# Patient Record
Sex: Female | Born: 1980 | Race: White | Hispanic: No | Marital: Single | Smoking: Current every day smoker
Health system: Southern US, Community
[De-identification: ages and names within clinical notes are randomized; demographics above are authoritative.]

## PROBLEM LIST (undated history)

## (undated) DIAGNOSIS — M51369 Other intervertebral disc degeneration, lumbar region without mention of lumbar back pain or lower extremity pain: Secondary | ICD-10-CM

## (undated) DIAGNOSIS — F1190 Opioid use, unspecified, uncomplicated: Secondary | ICD-10-CM

## (undated) DIAGNOSIS — K760 Fatty (change of) liver, not elsewhere classified: Secondary | ICD-10-CM

## (undated) DIAGNOSIS — R51 Headache: Secondary | ICD-10-CM

## (undated) DIAGNOSIS — M5136 Other intervertebral disc degeneration, lumbar region: Secondary | ICD-10-CM

## (undated) DIAGNOSIS — I10 Essential (primary) hypertension: Secondary | ICD-10-CM

## (undated) DIAGNOSIS — J449 Chronic obstructive pulmonary disease, unspecified: Secondary | ICD-10-CM

## (undated) DIAGNOSIS — G43909 Migraine, unspecified, not intractable, without status migrainosus: Secondary | ICD-10-CM

## (undated) DIAGNOSIS — K219 Gastro-esophageal reflux disease without esophagitis: Secondary | ICD-10-CM

## (undated) DIAGNOSIS — F17200 Nicotine dependence, unspecified, uncomplicated: Secondary | ICD-10-CM

## (undated) DIAGNOSIS — G4700 Insomnia, unspecified: Secondary | ICD-10-CM

## (undated) DIAGNOSIS — R4020 Unspecified coma: Secondary | ICD-10-CM

## (undated) DIAGNOSIS — B1920 Unspecified viral hepatitis C without hepatic coma: Secondary | ICD-10-CM

## (undated) DIAGNOSIS — F410 Panic disorder [episodic paroxysmal anxiety] without agoraphobia: Secondary | ICD-10-CM

## (undated) DIAGNOSIS — F1020 Alcohol dependence, uncomplicated: Secondary | ICD-10-CM

## (undated) DIAGNOSIS — F419 Anxiety disorder, unspecified: Secondary | ICD-10-CM

## (undated) DIAGNOSIS — R000 Tachycardia, unspecified: Secondary | ICD-10-CM

## (undated) DIAGNOSIS — B182 Chronic viral hepatitis C: Secondary | ICD-10-CM

## (undated) DIAGNOSIS — B977 Papillomavirus as the cause of diseases classified elsewhere: Secondary | ICD-10-CM

## (undated) DIAGNOSIS — R519 Headache, unspecified: Secondary | ICD-10-CM

## (undated) HISTORY — PX: WISDOM TOOTH EXTRACTION: SHX21

## (undated) HISTORY — DX: Anxiety disorder, unspecified: F41.9

## (undated) HISTORY — DX: Panic disorder (episodic paroxysmal anxiety): F41.0

## (undated) HISTORY — DX: Papillomavirus as the cause of diseases classified elsewhere: B97.7

## (undated) HISTORY — DX: Unspecified coma: R40.20

## (undated) HISTORY — DX: Headache: R51

## (undated) HISTORY — DX: Headache, unspecified: R51.9

## (undated) HISTORY — DX: Chronic viral hepatitis C: B18.2

## (undated) HISTORY — DX: Migraine, unspecified, not intractable, without status migrainosus: G43.909

## (undated) HISTORY — PX: SMALL INTESTINE SURGERY: SHX150

## (undated) HISTORY — DX: Unspecified viral hepatitis C without hepatic coma: B19.20

---

## 2009-08-18 ENCOUNTER — Encounter: Payer: Self-pay | Admitting: Cardiology

## 2009-09-22 ENCOUNTER — Encounter: Payer: Self-pay | Admitting: Cardiology

## 2009-10-21 DIAGNOSIS — F17200 Nicotine dependence, unspecified, uncomplicated: Secondary | ICD-10-CM

## 2009-10-21 DIAGNOSIS — R002 Palpitations: Secondary | ICD-10-CM

## 2009-10-21 DIAGNOSIS — F411 Generalized anxiety disorder: Secondary | ICD-10-CM | POA: Insufficient documentation

## 2009-10-21 DIAGNOSIS — G4700 Insomnia, unspecified: Secondary | ICD-10-CM

## 2009-10-24 ENCOUNTER — Ambulatory Visit: Payer: Self-pay | Admitting: Cardiology

## 2009-10-24 DIAGNOSIS — R0602 Shortness of breath: Secondary | ICD-10-CM | POA: Insufficient documentation

## 2010-03-19 DIAGNOSIS — J189 Pneumonia, unspecified organism: Secondary | ICD-10-CM

## 2010-03-19 HISTORY — DX: Pneumonia, unspecified organism: J18.9

## 2010-04-18 NOTE — Letter (Signed)
Summary: External Correspondence/ OFFICE VISIT DR. Aleen Campi  External Correspondence/ OFFICE VISIT DR. Aleen Campi   Imported By: Dorise Hiss 09/26/2009 14:55:49  _____________________________________________________________________  External Attachment:    Type:   Image     Comment:   External Document

## 2010-04-18 NOTE — Letter (Signed)
Summary: External Correspondence/ PROGRESS NOTE DR. TYSINGER  External Correspondence/ PROGRESS NOTE DR. TYSINGER   Imported By: Dorise Hiss 09/26/2009 14:53:17  _____________________________________________________________________  External Attachment:    Type:   Image     Comment:   External Document

## 2010-04-18 NOTE — Assessment & Plan Note (Signed)
Summary: NP-PALPS   Visit Type:  Initial Consult Primary Provider:  Dr. Wyvonnia Lora  CC:  palpitations.  History of Present Illness: The patient presents for evaluation of palpitations. She noticed these several weeks ago. Did feel her heart fluttering. She described skipping heartbeats that occurred randomly. These are not associated with activity. 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 She might get slightly short of breath with this. However, she does not have chest pressure, neck or arm discomfort. She has not had presyncope or syncope. She has not had PND or orthopnea. She was being treated for anxiety at the time of the symptoms. She says that in the last 3 weeks she has had no further palpitations. She has had no prior cardiac workup. She did have palpitations during her pregnancies but never had an evaluation of this. She does drink caffeine. Of note TSH and electrolytes were normal recently.  Preventive Screening-Counseling & Management  Alcohol-Tobacco     Smoking Status: current     Packs/Day: 7 ciggs/day     Year Quit: 14 yrs  Current Medications (verified): 1)  Sertraline Hcl 50 Mg Tabs (Sertraline Hcl) .... Take 1 Tab By Mouth At Bedtime  Allergies (verified): No Known Drug Allergies  Past History:  Past Medical History: Headaches Anxiety Panic attacks HPV  Family History: No early heart disease Oldest sister age 100 depression/anxiety  Social History: Packs/Day:  7 ciggs/day  Review of Systems       As stated in the HPI and negative for all other systems.   Vital Signs:  Patient profile:   30 year old female Height:      58 inches Weight:      111.50 pounds BMI:     23.39 Pulse rate:   99 / minute BP sitting:   124 / 84  (left arm) Cuff size:   regular  Vitals Entered By: Hoover Brunette, LPN (October 24, 2009 1:04 PM) CC: palpitations Is Patient Diabetic? No Comments palpitations   Physical Exam  General:  Well developed, well  nourished, in no acute distress. Head:  normocephalic and atraumatic Eyes:  PERRLA/EOM intact; conjunctiva and lids normal. Mouth:  Edentulous. Oral mucosa normal. Neck:  Neck supple, no JVD. No masses, thyromegaly or abnormal cervical nodes. Chest Wall:  no deformities or breast masses noted Lungs:  Clear bilaterally to auscultation and percussion. Abdomen:  Bowel sounds positive; abdomen soft and non-tender without masses, organomegaly, or hernias noted. No hepatosplenomegaly. Msk:  Back normal, normal gait. Muscle strength and tone normal. Extremities:  No clubbing or cyanosis. Neurologic:  Alert and oriented x 3. Skin:  Intact without lesions or rashes. Cervical Nodes:  no significant adenopathy Axillary Nodes:  no significant adenopathy Inguinal Nodes:  no significant adenopathy Psych:  Normal affect.   Detailed Cardiovascular Exam  Neck    Carotids: Carotids full and equal bilaterally without bruits.      Neck Veins: Normal, no JVD.    Heart    Inspection: no deformities or lifts noted.      Palpation: normal PMI with no thrills palpable.      Auscultation: regular rate and rhythm, S1, S2 without murmurs, rubs, gallops, or clicks.    Vascular    Abdominal Aorta: no palpable masses, pulsations, or audible bruits.      Femoral Pulses: normal femoral pulses bilaterally.      Pedal Pulses: normal pedal pulses bilaterally.      Radial Pulses: normal radial pulses bilaterally.  Peripheral Circulation: no clubbing, cyanosis, or edema noted with normal capillary refill.     EKG  Procedure date:  09/22/2009  Findings:      Normal sinus rhythm, rate 84, axis within normal limits, intervals within normal limits, no acute ST-T wave changes.  Impression & Recommendations:  Problem # 1:  PALPITATIONS (ICD-785.1) The patient is no longer having these palpitations. We discussed avoidance of caffeine. If she gets recurrent symptoms she will let us know and I would apply either  a Holter or event monitor depending on frequency.  Problem # 2:  NONDEPENDENT TOBACCO USE DISORDER (ICD-305.1) We discussed the need to stop smoking. She doesn't seem particularly interested at this point.  Problem # 3:  DYSPNEA (ICD-786.05) I suspect that this might be related to smoking. I will check a BNP level as a screen.  Other Orders: T-BNP  (B Natriuretic Peptide) ([id number])  Patient Instructions: 1)  Your physician recommends that you go to the [facility] for lab work. If the results of your test are normal or stable, you will receive a letter. If they are abnormal, the nurse will contact you by phone.  2)  Follow up as needed

## 2012-11-13 ENCOUNTER — Encounter (HOSPITAL_COMMUNITY): Payer: Self-pay | Admitting: Physician Assistant

## 2012-11-25 ENCOUNTER — Ambulatory Visit (HOSPITAL_COMMUNITY): Payer: Medicaid Other

## 2012-12-09 ENCOUNTER — Ambulatory Visit (HOSPITAL_COMMUNITY)
Admission: RE | Admit: 2012-12-09 | Discharge: 2012-12-09 | Disposition: A | Discharge status: Home / Self Care | Payer: Medicaid Other | Source: Ambulatory Visit | Attending: Physician Assistant | Admitting: Physician Assistant

## 2012-12-09 NOTE — Progress Notes (Signed)
Patient did not come to appointment. Molly Maduro, MD

## 2016-05-27 ENCOUNTER — Encounter (HOSPITAL_COMMUNITY): Payer: Self-pay | Admitting: Emergency Medicine

## 2016-05-27 ENCOUNTER — Emergency Department (HOSPITAL_COMMUNITY)
Admission: EM | Admit: 2016-05-27 | Discharge: 2016-05-28 | Disposition: A | Discharge status: Home / Self Care | Payer: Medicaid Other | Attending: Emergency Medicine | Admitting: Emergency Medicine

## 2016-05-27 DIAGNOSIS — E876 Hypokalemia: Secondary | ICD-10-CM | POA: Insufficient documentation

## 2016-05-27 DIAGNOSIS — R112 Nausea with vomiting, unspecified: Secondary | ICD-10-CM | POA: Diagnosis present

## 2016-05-27 DIAGNOSIS — E860 Dehydration: Secondary | ICD-10-CM

## 2016-05-27 DIAGNOSIS — F17200 Nicotine dependence, unspecified, uncomplicated: Secondary | ICD-10-CM | POA: Diagnosis not present

## 2016-05-27 DIAGNOSIS — R197 Diarrhea, unspecified: Secondary | ICD-10-CM

## 2016-05-27 DIAGNOSIS — I10 Essential (primary) hypertension: Secondary | ICD-10-CM | POA: Insufficient documentation

## 2016-05-27 HISTORY — DX: Essential (primary) hypertension: I10

## 2016-05-27 LAB — CBC
HCT: 37.3 % (ref 36.0–46.0)
Hemoglobin: 13.1 g/dL (ref 12.0–15.0)
MCH: 31.5 pg (ref 26.0–34.0)
MCHC: 35.1 g/dL (ref 30.0–36.0)
MCV: 89.7 fL (ref 78.0–100.0)
Platelets: 225 10*3/uL (ref 150–400)
RBC: 4.16 MIL/uL (ref 3.87–5.11)
RDW: 14.3 % (ref 11.5–15.5)
WBC: 6.1 10*3/uL (ref 4.0–10.5)

## 2016-05-27 LAB — COMPREHENSIVE METABOLIC PANEL
ALK PHOS: 65 U/L (ref 38–126)
ALT: 22 U/L (ref 14–54)
AST: 47 U/L — ABNORMAL HIGH (ref 15–41)
Albumin: 3.4 g/dL — ABNORMAL LOW (ref 3.5–5.0)
Anion gap: 13 (ref 5–15)
BILIRUBIN TOTAL: 1.5 mg/dL — AB (ref 0.3–1.2)
BUN: <5 mg/dL — ABNORMAL LOW (ref 6–20)
CALCIUM: 8.5 mg/dL — AB (ref 8.9–10.3)
CO2: 23 mmol/L (ref 22–32)
CREATININE: 0.65 mg/dL (ref 0.44–1.00)
Chloride: 97 mmol/L — ABNORMAL LOW (ref 101–111)
GFR calc non Af Amer: >60 mL/min (ref >60)
Glucose, Bld: 135 mg/dL — ABNORMAL HIGH (ref 65–99)
Potassium: 3 mmol/L — ABNORMAL LOW (ref 3.5–5.1)
Sodium: 133 mmol/L — ABNORMAL LOW (ref 135–145)
Total Protein: 6.2 g/dL — ABNORMAL LOW (ref 6.5–8.1)

## 2016-05-27 LAB — LIPASE, BLOOD: Lipase: 12 U/L (ref 11–51)

## 2016-05-27 LAB — HCG, QUANTITATIVE, PREGNANCY

## 2016-05-27 MED ORDER — POTASSIUM CHLORIDE CRYS ER 20 MEQ PO TBCR: 20.0000 meq | EXTENDED_RELEASE_TABLET | Freq: Two times a day (BID) | ORAL | 0 refills | Status: DC

## 2016-05-27 MED ORDER — SODIUM CHLORIDE 0.9 % IV BOLUS (SEPSIS)
2000.0000 mL | Freq: Once | INTRAVENOUS | Status: AC
  Administered 2016-05-27: 2000 mL via INTRAVENOUS

## 2016-05-27 MED ORDER — ONDANSETRON 8 MG PO TBDP: 8.0000 mg | ORAL_TABLET | Freq: Three times a day (TID) | ORAL | 0 refills | Status: DC | PRN

## 2016-05-27 MED ORDER — LORAZEPAM 2 MG/ML IJ SOLN
1.0000 mg | Freq: Once | INTRAMUSCULAR | Status: AC
  Administered 2016-05-27: 1 mg via INTRAVENOUS
  Filled 2016-05-27: qty 1

## 2016-05-27 MED ORDER — METOCLOPRAMIDE HCL 5 MG/ML IJ SOLN
10.0000 mg | Freq: Once | INTRAMUSCULAR | Status: AC
  Administered 2016-05-27: 10 mg via INTRAVENOUS
  Filled 2016-05-27: qty 2

## 2016-05-27 MED ORDER — ONDANSETRON HCL 4 MG/2ML IJ SOLN
4.0000 mg | Freq: Once | INTRAMUSCULAR | Status: AC
  Administered 2016-05-27: 4 mg via INTRAVENOUS
  Filled 2016-05-27: qty 2

## 2016-05-27 NOTE — ED Triage Notes (Signed)
Pt states that she has been having vomiting and diarrhea for 4 days, started having high bp today

## 2016-05-27 NOTE — ED Notes (Signed)
Pt with piloerection, N/vomiting MD apprised, orders received.

## 2016-05-27 NOTE — ED Provider Notes (Signed)
[facility] Provider Note   CSN: [id number] Arrival date & time: 05/27/16  1940     History   Chief Complaint Chief Complaint  Patient presents with  . Emesis  . Hypertension    HPI Hannah Rhodes is a 36 y.o. female.  HPI Patient ports nausea vomiting and diarrhea over the past 4 days with inability keep fluids down.  She feels weak.  She presents tachycardic to 140.  No hematemesis.  No melena or hematochezia described.  Denies abdominal pain.  No fever or chills.  No recent sick contacts.  Symptoms are moderate in severity.  No other complaints  Past Medical History:  Diagnosis Date  . Anxiety   . Generalized headaches   . HPV (human papilloma virus) infection   . Hypertension   . Panic attack     Patient Active Problem List   Diagnosis Date Noted  . DYSPNEA 10/24/2009  . ANXIETY STATE, UNSPECIFIED 10/21/2009  . NONDEPENDENT TOBACCO USE DISORDER 10/21/2009  . INSOMNIA UNSPECIFIED 10/21/2009  . PALPITATIONS 10/21/2009    Past Surgical History:  Procedure Laterality Date  . CESAREAN SECTION    . CESAREAN SECTION    . SMALL INTESTINE SURGERY      OB History    No data available       Home Medications    Prior to Admission medications   Medication Sig Start Date End Date Taking? Authorizing Provider  sertraline (ZOLOFT) 50 MG tablet Take 50 mg by mouth daily.    Historical Provider, MD    Family History History reviewed. No pertinent family history.  Social History Social History  Substance Use Topics  . Smoking status: Current Every Day Smoker    Packs/day: 0.50  . Smokeless tobacco: Never Used  . Alcohol use Yes     Comment: occ     Allergies   Patient has no known allergies.   Review of Systems Review of Systems  All other systems reviewed and are negative.    Physical Exam Updated Vital Signs BP 138/99   Pulse 97   Temp 98.8 F (37.1 C) (Oral)   Resp 15   Ht 4\' 10"  (1.473 m)   Wt 100 lb (45.4 kg)   LMP 05/26/2016  (Exact Date)   SpO2 98%   BMI 20.90 kg/m   Physical Exam  Constitutional: She is oriented to person, place, and time. She appears well-developed and well-nourished. No distress.  HENT:  Head: Normocephalic and atraumatic.  Eyes: EOM are normal.  Neck: Normal range of motion.  Cardiovascular: Regular rhythm and normal heart sounds.   Tachycardia  Pulmonary/Chest: Effort normal and breath sounds normal.  Abdominal: Soft. She exhibits no distension. There is no tenderness.  Musculoskeletal: Normal range of motion.  Neurological: She is alert and oriented to person, place, and time.  Skin: Skin is warm and dry.  Psychiatric: She has a normal mood and affect. Judgment normal.  Nursing note and vitals reviewed.    ED Treatments / Results  Labs (all labs ordered are listed, but only abnormal results are displayed) Labs Reviewed  COMPREHENSIVE METABOLIC PANEL - Abnormal; Notable for the following:       Result Value   Sodium 133 (*)    Potassium 3.0 (*)    Chloride 97 (*)    Glucose, Bld 135 (*)    BUN <5 (*)    Calcium 8.5 (*)    Total Protein 6.2 (*)    Albumin 3.4 (*)  AST 47 (*)    Total Bilirubin 1.5 (*)    All other components within normal limits  LIPASE, BLOOD  CBC  HCG, QUANTITATIVE, PREGNANCY  URINALYSIS, ROUTINE W REFLEX MICROSCOPIC    EKG  EKG Interpretation  Date/Time:  Sunday May 27 2016 20:27:05 EDT Ventricular Rate:  110 PR Interval:    QRS Duration: 80 QT Interval:  334 QTC Calculation: 452 R Axis:   7 Text Interpretation:  Sinus tachycardia Right atrial enlargement Baseline wander in lead(s) V5 No old tracing to compare Confirmed by Oluwatosin Bracy  MD, Vanette Noguchi ([id number]) on 05/27/2016 9:46:59 PM       Radiology No results found.  Procedures Procedures (including critical care time)  Medications Ordered in ED Medications  sodium chloride 0.9 % bolus 2,000 mL (2,000 mLs Intravenous New Bag/Given 05/27/16 2105)  ondansetron (ZOFRAN) injection 4 mg (4  mg Intravenous Given 05/27/16 2112)  metoCLOPramide (REGLAN) injection 10 mg (10 mg Intravenous Given 05/27/16 2158)  LORazepam (ATIVAN) injection 1 mg (1 mg Intravenous Given 05/27/16 2157)     Initial Impression / Assessment and Plan / ED Course  I have reviewed the triage vital signs and the nursing notes.  Pertinent labs & imaging results that were available during my care of the patient were reviewed by me and considered in my medical decision making (see chart for details).     10 :09 PM Patient feels better after second dose of anti-medics here.  Her heart rate is 100.  She is receiving IV fluids.  Her repeat abdominal exam is without tenderness.  She will be discharged home after she completes her IV fluids.  She does have mild hypokalemia with a K of 3.  She'll be discharged home with nausea medications and oral potassium.  Primary care follow-up.  She understands to return to the ER for new or worsening symptoms  Final Clinical Impressions(s) / ED Diagnoses   Final diagnoses:  None    New Prescriptions New Prescriptions   No medications on file     Azalia BilisKevin Nicky Kras, MD 05/27/16 2209

## 2016-05-28 NOTE — ED Notes (Signed)
Patient unable to provide specimen for UA.

## 2016-12-05 ENCOUNTER — Emergency Department (HOSPITAL_COMMUNITY)
Admission: EM | Admit: 2016-12-05 | Discharge: 2016-12-05 | Disposition: A | Discharge status: Home / Self Care | Payer: Medicaid Other | Attending: Emergency Medicine | Admitting: Emergency Medicine

## 2016-12-05 ENCOUNTER — Encounter (HOSPITAL_COMMUNITY): Payer: Self-pay | Admitting: *Deleted

## 2016-12-05 DIAGNOSIS — F419 Anxiety disorder, unspecified: Secondary | ICD-10-CM | POA: Insufficient documentation

## 2016-12-05 DIAGNOSIS — F17210 Nicotine dependence, cigarettes, uncomplicated: Secondary | ICD-10-CM | POA: Insufficient documentation

## 2016-12-05 DIAGNOSIS — I10 Essential (primary) hypertension: Secondary | ICD-10-CM | POA: Diagnosis not present

## 2016-12-05 DIAGNOSIS — Z79899 Other long term (current) drug therapy: Secondary | ICD-10-CM | POA: Insufficient documentation

## 2016-12-05 DIAGNOSIS — R000 Tachycardia, unspecified: Secondary | ICD-10-CM | POA: Diagnosis not present

## 2016-12-05 DIAGNOSIS — R002 Palpitations: Secondary | ICD-10-CM | POA: Diagnosis present

## 2016-12-05 LAB — COMPREHENSIVE METABOLIC PANEL
ALBUMIN: 4.3 g/dL (ref 3.5–5.0)
ALK PHOS: 80 U/L (ref 38–126)
ALT: 34 U/L (ref 14–54)
AST: 66 U/L — AB (ref 15–41)
BUN: 8 mg/dL (ref 6–20)
CALCIUM: 9 mg/dL (ref 8.9–10.3)
CO2: 17 mmol/L — AB (ref 22–32)
CREATININE: 0.78 mg/dL (ref 0.44–1.00)
Chloride: 97 mmol/L — ABNORMAL LOW (ref 101–111)
GFR calc non Af Amer: >60 mL/min (ref >60)
GLUCOSE: 96 mg/dL (ref 65–99)
Potassium: 3.3 mmol/L — ABNORMAL LOW (ref 3.5–5.1)
SODIUM: 135 mmol/L (ref 135–145)
Total Bilirubin: 2.1 mg/dL — ABNORMAL HIGH (ref 0.3–1.2)
Total Protein: 7.3 g/dL (ref 6.5–8.1)

## 2016-12-05 LAB — CBC WITH DIFFERENTIAL/PLATELET
BASOS PCT: 0 %
Basophils Absolute: 0 10*3/uL (ref 0.0–0.1)
EOS PCT: 0 %
Eosinophils Absolute: 0 10*3/uL (ref 0.0–0.7)
HEMATOCRIT: 35.6 % — AB (ref 36.0–46.0)
Hemoglobin: 12.2 g/dL (ref 12.0–15.0)
Lymphocytes Relative: 35 %
Lymphs Abs: 2.4 10*3/uL (ref 0.7–4.0)
MCH: 33.4 pg (ref 26.0–34.0)
MCHC: 34.3 g/dL (ref 30.0–36.0)
MCV: 97.5 fL (ref 78.0–100.0)
MONO ABS: 0.8 10*3/uL (ref 0.1–1.0)
Monocytes Relative: 12 %
NEUTROS ABS: 3.6 10*3/uL (ref 1.7–7.7)
Neutrophils Relative %: 53 %
PLATELETS: 307 10*3/uL (ref 150–400)
RBC: 3.65 MIL/uL — ABNORMAL LOW (ref 3.87–5.11)
RDW: 16.4 % — AB (ref 11.5–15.5)
WBC: 6.8 10*3/uL (ref 4.0–10.5)

## 2016-12-05 LAB — TROPONIN I: Troponin I: <0.03 ng/mL (ref <0.03)

## 2016-12-05 LAB — TSH: TSH: 1.505 u[IU]/mL (ref 0.350–4.500)

## 2016-12-05 MED ORDER — LORAZEPAM 0.5 MG PO TABS: 0.5000 mg | ORAL_TABLET | Freq: Three times a day (TID) | ORAL | 0 refills | Status: DC | PRN

## 2016-12-05 MED ORDER — METOPROLOL TARTRATE 25 MG PO TABS: ORAL_TABLET | ORAL | 0 refills | Status: DC

## 2016-12-05 MED ORDER — LORAZEPAM 2 MG/ML IJ SOLN
0.5000 mg | Freq: Once | INTRAMUSCULAR | Status: AC
  Administered 2016-12-05: 0.5 mg via INTRAVENOUS
  Filled 2016-12-05: qty 1

## 2016-12-05 MED ORDER — SODIUM CHLORIDE 0.9 % IV BOLUS (SEPSIS)
1000.0000 mL | Freq: Once | INTRAVENOUS | Status: AC
  Administered 2016-12-05: 1000 mL via INTRAVENOUS

## 2016-12-05 MED ORDER — ONDANSETRON HCL 4 MG/2ML IJ SOLN
INTRAMUSCULAR | Status: AC
  Filled 2016-12-05: qty 2

## 2016-12-05 MED ORDER — METOPROLOL TARTRATE 5 MG/5ML IV SOLN
5.0000 mg | Freq: Once | INTRAVENOUS | Status: AC
  Administered 2016-12-05: 5 mg via INTRAVENOUS
  Filled 2016-12-05: qty 5

## 2016-12-05 MED ORDER — ONDANSETRON HCL 4 MG/2ML IJ SOLN
4.0000 mg | Freq: Once | INTRAMUSCULAR | Status: AC
  Administered 2016-12-05: 4 mg via INTRAVENOUS

## 2016-12-05 NOTE — Discharge Instructions (Signed)
Avoid nicotine.

## 2016-12-05 NOTE — ED Provider Notes (Signed)
[facility] Provider Note   CSN: [id number] Arrival date & time: 12/05/16  1478     History   Chief Complaint Chief Complaint  Patient presents with  . Palpitations    HPI Rheanne Cortopassi is a 36 y.o. female.  Pt presents to the ED today with palpitations and cp.  Pt said she's had problems with tachycardia for months.  She was on a medication to control it, but has been out of it for the past 3 days.  She does not have a pcp to call for a refill.  The pt does not know the name of the medication.  She also has a hx of htn, but did not take her meds this morning.  She does not know the name of that med.  The pt denies sob.  No f/c.  She does feel anxious which she also has a hx of feeling.        Past Medical History:  Diagnosis Date  . Anxiety   . Generalized headaches   . HPV (human papilloma virus) infection   . Hypertension   . Panic attack     Patient Active Problem List   Diagnosis Date Noted  . DYSPNEA 10/24/2009  . ANXIETY STATE, UNSPECIFIED 10/21/2009  . NONDEPENDENT TOBACCO USE DISORDER 10/21/2009  . INSOMNIA UNSPECIFIED 10/21/2009  . PALPITATIONS 10/21/2009    Past Surgical History:  Procedure Laterality Date  . CESAREAN SECTION    . CESAREAN SECTION    . SMALL INTESTINE SURGERY      OB History    No data available       Home Medications    Prior to Admission medications   Medication Sig Start Date End Date Taking? Authorizing Provider  LORazepam (ATIVAN) 0.5 MG tablet Take 1 tablet (0.5 mg total) by mouth every 8 (eight) hours as needed for anxiety. 12/05/16   Jacalyn Lefevre, MD  metoprolol tartrate (LOPRESSOR) 25 MG tablet bid 12/05/16   Jacalyn Lefevre, MD  ondansetron (ZOFRAN ODT) 8 MG disintegrating tablet Take 1 tablet (8 mg total) by mouth every 8 (eight) hours as needed for nausea or vomiting. 05/27/16   Azalia Bilis, MD  potassium chloride SA (K-DUR,KLOR-CON) 20 MEQ tablet Take 1 tablet (20 mEq total) by mouth 2 (two) times daily.  05/27/16   Azalia Bilis, MD  sertraline (ZOLOFT) 50 MG tablet Take 50 mg by mouth daily.    [provider]    Family History No family history on file.  Social History Social History  Substance Use Topics  . Smoking status: Current Every Day Smoker    Packs/day: 0.50  . Smokeless tobacco: Never Used  . Alcohol use Yes     Comment: occ     Allergies   Patient has no known allergies.   Review of Systems Review of Systems  Cardiovascular: Positive for chest pain and palpitations.  Psychiatric/Behavioral: The patient is nervous/anxious.   All other systems reviewed and are negative.    Physical Exam Updated Vital Signs BP (!) 127/94   Pulse 100   Temp 98.8 F (37.1 C) (Oral)   Resp 20   Ht  (1.473 m)   Wt 47.6 kg (105 lb)   LMP 12/05/2016   SpO2 98%   BMI 21.95 kg/m   Physical Exam  Constitutional: She is oriented to person, place, and time. She appears well-developed and well-nourished.  HENT:  Head: Normocephalic and atraumatic.  Right Ear: External ear normal.  Left Ear: External ear  normal.  Nose: Nose normal.  Mouth/Throat: Oropharynx is clear and moist.  Eyes: Pupils are equal, round, and reactive to light. Conjunctivae and EOM are normal.  Neck: Normal range of motion. Neck supple.  Cardiovascular: Regular rhythm, normal heart sounds and intact distal pulses.  Tachycardia present.   Pulmonary/Chest: Effort normal and breath sounds normal.  Abdominal: Soft. Bowel sounds are normal.  Musculoskeletal: Normal range of motion.  Neurological: She is alert and oriented to person, place, and time.  Skin: Skin is warm. Capillary refill takes less than 2 seconds.  Psychiatric: She has a normal mood and affect. Her behavior is normal. Judgment and thought content normal.  Nursing note and vitals reviewed.    ED Treatments / Results  Labs (all labs ordered are listed, but only abnormal results are displayed) Labs Reviewed  COMPREHENSIVE  METABOLIC PANEL - Abnormal; Notable for the following:       Result Value   Potassium 3.3 (*)    Chloride 97 (*)    CO2 17 (*)    AST 66 (*)    Total Bilirubin 2.1 (*)    All other components within normal limits  CBC WITH DIFFERENTIAL/PLATELET - Abnormal; Notable for the following:    RBC 3.65 (*)    HCT 35.6 (*)    RDW 16.4 (*)    All other components within normal limits  TROPONIN I  TSH  URINALYSIS, ROUTINE W REFLEX MICROSCOPIC  PREGNANCY, URINE  RAPID URINE DRUG SCREEN, HOSP PERFORMED    EKG  EKG Interpretation  Date/Time:  Wednesday December 05 2016 06:40:22 EDT Ventricular Rate:  121 PR Interval:    QRS Duration: 84 QT Interval:  324 QTC Calculation: 460 R Axis:   24 Text Interpretation:  Sinus tachycardia LAE, consider biatrial enlargement No significant change since last tracing Confirmed by Jacalyn Lefevre [phone]) on 12/05/2016 6:57:23 AM       Radiology No results found.  Procedures Procedures (including critical care time)  Medications Ordered in ED Medications  sodium chloride 0.9 % bolus 1,000 mL (1,000 mLs Intravenous New Bag/Given 12/05/16 0713)  LORazepam (ATIVAN) injection 0.5 mg (0.5 mg Intravenous Given 12/05/16 0713)  metoprolol tartrate (LOPRESSOR) injection 5 mg (5 mg Intravenous Given 12/05/16 0713)  ondansetron (ZOFRAN) injection 4 mg (4 mg Intravenous Given 12/05/16 0756)     Initial Impression / Assessment and Plan / ED Course  I have reviewed the triage vital signs and the nursing notes.  Pertinent labs & imaging results that were available during my care of the patient were reviewed by me and considered in my medical decision making (see chart for details).     Pt spoke with her mom.  She was on metoprolol 25 mg daily.  She has been on wellbutrin and zoloft for anxiety, but those have not worked.  Pt encouraged to f/u with pcp on medicaid card and f/u with cardiology.  Return if worse.  Final Clinical Impressions(s) / ED Diagnoses    Final diagnoses:  Sinus tachycardia  Anxiety    New Prescriptions New Prescriptions   LORAZEPAM (ATIVAN) 0.5 MG TABLET    Take 1 tablet (0.5 mg total) by mouth every 8 (eight) hours as needed for anxiety.   METOPROLOL TARTRATE (LOPRESSOR) 25 MG TABLET    bid     Jacalyn Lefevre, MD 12/05/16 [phone]

## 2016-12-05 NOTE — ED Triage Notes (Signed)
Pt says she  Has had a rapid heart rate for the past 3 hours. Pt has been on medication to keep under control but has been out of it for the past 3 days. No PCP at this time.

## 2016-12-05 NOTE — ED Notes (Signed)
This nurse entered room. Pt dry heaving.

## 2016-12-05 NOTE — ED Notes (Signed)
Pt sleeping at this time. NAD noted.

## 2016-12-05 NOTE — ED Notes (Signed)
Patient states she is unable to give a urine sample at this time 

## 2016-12-19 ENCOUNTER — Emergency Department (HOSPITAL_COMMUNITY)
Admission: EM | Admit: 2016-12-19 | Discharge: 2016-12-19 | Disposition: A | Discharge status: Home / Self Care | Payer: Medicaid Other | Attending: Emergency Medicine | Admitting: Emergency Medicine

## 2016-12-19 ENCOUNTER — Encounter (HOSPITAL_COMMUNITY): Payer: Self-pay

## 2016-12-19 ENCOUNTER — Emergency Department (HOSPITAL_COMMUNITY): Payer: Medicaid Other

## 2016-12-19 DIAGNOSIS — I10 Essential (primary) hypertension: Secondary | ICD-10-CM | POA: Diagnosis not present

## 2016-12-19 DIAGNOSIS — R000 Tachycardia, unspecified: Secondary | ICD-10-CM

## 2016-12-19 DIAGNOSIS — F1410 Cocaine abuse, uncomplicated: Secondary | ICD-10-CM | POA: Insufficient documentation

## 2016-12-19 DIAGNOSIS — F17210 Nicotine dependence, cigarettes, uncomplicated: Secondary | ICD-10-CM | POA: Insufficient documentation

## 2016-12-19 DIAGNOSIS — Z79899 Other long term (current) drug therapy: Secondary | ICD-10-CM | POA: Diagnosis not present

## 2016-12-19 DIAGNOSIS — R079 Chest pain, unspecified: Secondary | ICD-10-CM | POA: Insufficient documentation

## 2016-12-19 HISTORY — DX: Tachycardia, unspecified: R00.0

## 2016-12-19 LAB — CBC WITH DIFFERENTIAL/PLATELET
BASOS PCT: 1 %
Basophils Absolute: 0 10*3/uL (ref 0.0–0.1)
EOS ABS: 0 10*3/uL (ref 0.0–0.7)
Eosinophils Relative: 0 %
HCT: 38.2 % (ref 36.0–46.0)
HEMOGLOBIN: 12.8 g/dL (ref 12.0–15.0)
Lymphocytes Relative: 32 %
Lymphs Abs: 1.2 10*3/uL (ref 0.7–4.0)
MCH: 32.9 pg (ref 26.0–34.0)
MCHC: 33.5 g/dL (ref 30.0–36.0)
MCV: 98.2 fL (ref 78.0–100.0)
MONOS PCT: 8 %
Monocytes Absolute: 0.3 10*3/uL (ref 0.1–1.0)
NEUTROS PCT: 59 %
Neutro Abs: 2.3 10*3/uL (ref 1.7–7.7)
Platelets: 293 10*3/uL (ref 150–400)
RBC: 3.89 MIL/uL (ref 3.87–5.11)
RDW: 15.7 % — AB (ref 11.5–15.5)
WBC: 3.9 10*3/uL — AB (ref 4.0–10.5)

## 2016-12-19 LAB — COMPREHENSIVE METABOLIC PANEL
ALBUMIN: 3.7 g/dL (ref 3.5–5.0)
ALK PHOS: 86 U/L (ref 38–126)
ALT: 38 U/L (ref 14–54)
ANION GAP: 18 — AB (ref 5–15)
AST: 76 U/L — ABNORMAL HIGH (ref 15–41)
BUN: 7 mg/dL (ref 6–20)
CO2: 18 mmol/L — AB (ref 22–32)
Calcium: 8.8 mg/dL — ABNORMAL LOW (ref 8.9–10.3)
Chloride: 95 mmol/L — ABNORMAL LOW (ref 101–111)
Creatinine, Ser: 0.66 mg/dL (ref 0.44–1.00)
GFR calc Af Amer: >60 mL/min (ref >60)
GFR calc non Af Amer: >60 mL/min (ref >60)
GLUCOSE: 118 mg/dL — AB (ref 65–99)
POTASSIUM: 4.2 mmol/L (ref 3.5–5.1)
SODIUM: 131 mmol/L — AB (ref 135–145)
TOTAL PROTEIN: 6.9 g/dL (ref 6.5–8.1)
Total Bilirubin: 1.1 mg/dL (ref 0.3–1.2)

## 2016-12-19 LAB — TROPONIN I: Troponin I: <0.03 ng/mL (ref <0.03)

## 2016-12-19 LAB — RAPID URINE DRUG SCREEN, HOSP PERFORMED
AMPHETAMINES: NOT DETECTED
BARBITURATES: NOT DETECTED
Benzodiazepines: POSITIVE — AB
COCAINE: POSITIVE — AB
OPIATES: NOT DETECTED
TETRAHYDROCANNABINOL: NOT DETECTED

## 2016-12-19 LAB — TSH: TSH: 0.606 u[IU]/mL (ref 0.350–4.500)

## 2016-12-19 MED ORDER — ONDANSETRON HCL 4 MG/2ML IJ SOLN
4.0000 mg | Freq: Once | INTRAMUSCULAR | Status: AC
  Administered 2016-12-19: 4 mg via INTRAVENOUS
  Filled 2016-12-19: qty 2

## 2016-12-19 MED ORDER — SODIUM CHLORIDE 0.9 % IV BOLUS (SEPSIS)
1000.0000 mL | Freq: Once | INTRAVENOUS | Status: AC
  Administered 2016-12-19: 1000 mL via INTRAVENOUS

## 2016-12-19 NOTE — Discharge Instructions (Signed)
Tests show no life-threatening condition. Drinking and smoking will make your pulse elevated. Follow-up with the cardiologist for your scheduled appointment.

## 2016-12-19 NOTE — ED Triage Notes (Signed)
Pt reports increased heart rate and chest pain since last night.  Says symptoms started while she was watching tv last night.  Pt says she takes metoprolol  but says isn't helping.

## 2016-12-22 NOTE — ED Provider Notes (Signed)
[facility] Provider Note   CSN: [id number] Arrival date & time: 12/19/16  0940     History   Chief Complaint Chief Complaint  Patient presents with  . Tachycardia  . Chest Pain    HPI Hannah Rhodes is a 36 y.o. female.  Patient presents with concern for increased pulse. This is a long-standing problem. She had a brief episode of chest pain described as "something stuck" without nausea, vomiting, diaphoresis, dyspnea. Patient is allegedly taking metoprolol 25 mg twice a day. She does smoke cigarettes and drink alcohol. She has an appointment with cardiology on 12/27/16.      Past Medical History:  Diagnosis Date  . Anxiety   . Generalized headaches   . HPV (human papilloma virus) infection   . Hypertension   . Panic attack   . Tachycardia     Patient Active Problem List   Diagnosis Date Noted  . DYSPNEA 10/24/2009  . ANXIETY STATE, UNSPECIFIED 10/21/2009  . NONDEPENDENT TOBACCO USE DISORDER 10/21/2009  . INSOMNIA UNSPECIFIED 10/21/2009  . PALPITATIONS 10/21/2009    Past Surgical History:  Procedure Laterality Date  . CESAREAN SECTION    . CESAREAN SECTION    . SMALL INTESTINE SURGERY      OB History    No data available       Home Medications    Prior to Admission medications   Medication Sig Start Date End Date Taking? Authorizing Provider  metoprolol tartrate (LOPRESSOR) 25 MG tablet bid Patient taking differently: 25 mg 2 (two) times daily. bid 12/05/16  Yes Jacalyn Lefevre, MD  LORazepam (ATIVAN) 0.5 MG tablet Take 1 tablet (0.5 mg total) by mouth every 8 (eight) hours as needed for anxiety. Patient not taking: Reported on 12/19/2016 12/05/16   Jacalyn Lefevre, MD  ondansetron (ZOFRAN ODT) 8 MG disintegrating tablet Take 1 tablet (8 mg total) by mouth every 8 (eight) hours as needed for nausea or vomiting. Patient not taking: Reported on 12/19/2016 05/27/16   Azalia Bilis, MD  potassium chloride SA (K-DUR,KLOR-CON) 20 MEQ tablet Take 1 tablet  (20 mEq total) by mouth 2 (two) times daily. Patient not taking: Reported on 12/19/2016 05/27/16   Azalia Bilis, MD    Family History No family history on file.  Social History Social History  Substance Use Topics  . Smoking status: Current Every Day Smoker    Packs/day: 0.50  . Smokeless tobacco: Never Used  . Alcohol use Yes     Comment: occ     Allergies   Patient has no known allergies.   Review of Systems Review of Systems  All other systems reviewed and are negative.    Physical Exam Updated Vital Signs BP (!) 154/101   Pulse 82   Temp 97.7 F (36.5 C) (Oral)   Resp 15   Ht  (1.473 m)   Wt 47.6 kg (105 lb)   LMP 12/05/2016   SpO2 97%   BMI 21.95 kg/m   Physical Exam  Constitutional: She is oriented to person, place, and time.  nad  HENT:  Head: Normocephalic and atraumatic.  Eyes: Conjunctivae are normal.  Neck: Neck supple.  Cardiovascular: Regular rhythm.   tachy  Pulmonary/Chest: Effort normal and breath sounds normal.  Abdominal: Soft. Bowel sounds are normal.  Musculoskeletal: Normal range of motion.  Neurological: She is alert and oriented to person, place, and time.  Skin: Skin is warm and dry.  Psychiatric: She has a normal mood and affect. Her behavior is normal.  Nursing note and vitals reviewed.    ED Treatments / Results  Labs (all labs ordered are listed, but only abnormal results are displayed) Labs Reviewed  CBC WITH DIFFERENTIAL/PLATELET - Abnormal; Notable for the following:       Result Value   WBC 3.9 (*)    RDW 15.7 (*)    All other components within normal limits  COMPREHENSIVE METABOLIC PANEL - Abnormal; Notable for the following:    Sodium 131 (*)    Chloride 95 (*)    CO2 18 (*)    Glucose, Bld 118 (*)    Calcium 8.8 (*)    AST 76 (*)    Anion gap 18 (*)    All other components within normal limits  RAPID URINE DRUG SCREEN, HOSP PERFORMED - Abnormal; Notable for the following:    Cocaine POSITIVE (*)      Benzodiazepines POSITIVE (*)    All other components within normal limits  TROPONIN I  TSH    EKG  EKG Interpretation  Date/Time:  Wednesday December 19 2016 09:50:53 EDT Ventricular Rate:  107 PR Interval:    QRS Duration: 86 QT Interval:  350 QTC Calculation: 467 R Axis:   -10 Text Interpretation:  Sinus tachycardia Atrial premature complex Right atrial enlargement Low voltage, extremity and precordial leads Confirmed by Donnetta Hutching ([id number]) on 12/19/2016 12:07:47 PM       Radiology No results found.  Procedures Procedures (including critical care time)  Medications Ordered in ED Medications  sodium chloride 0.9 % bolus 1,000 mL (0 mLs Intravenous Stopped 12/19/16 1430)  ondansetron (ZOFRAN) injection 4 mg (4 mg Intravenous Given 12/19/16 1046)     Initial Impression / Assessment and Plan / ED Course  I have reviewed the triage vital signs and the nursing notes.  Pertinent labs & imaging results that were available during my care of the patient were reviewed by me and considered in my medical decision making (see chart for details).     Patient is hemodynamically stable. She is slightly tachycardic. Drug screen positive for cocaine.  TSH normal. Recommend follow-up with cardiology for which she has an appointment.  Final Clinical Impressions(s) / ED Diagnoses   Final diagnoses:  Tachycardia    New Prescriptions Discharge Medication List as of 12/19/2016  3:35 PM       Donnetta Hutching, MD 12/22/16 1123

## 2016-12-27 ENCOUNTER — Ambulatory Visit (INDEPENDENT_AMBULATORY_CARE_PROVIDER_SITE_OTHER): Payer: Medicaid Other | Admitting: Cardiovascular Disease

## 2016-12-27 ENCOUNTER — Encounter: Payer: Self-pay | Admitting: Cardiovascular Disease

## 2016-12-27 VITALS — BP 116/82 | HR 114 | Ht <= 58 in | Wt 96.8 lb

## 2016-12-27 DIAGNOSIS — R002 Palpitations: Secondary | ICD-10-CM | POA: Diagnosis not present

## 2016-12-27 DIAGNOSIS — R413 Other amnesia: Secondary | ICD-10-CM

## 2016-12-27 DIAGNOSIS — R079 Chest pain, unspecified: Secondary | ICD-10-CM | POA: Diagnosis not present

## 2016-12-27 DIAGNOSIS — R0609 Other forms of dyspnea: Secondary | ICD-10-CM | POA: Diagnosis not present

## 2016-12-27 DIAGNOSIS — R000 Tachycardia, unspecified: Secondary | ICD-10-CM | POA: Diagnosis not present

## 2016-12-27 MED ORDER — METOPROLOL TARTRATE 50 MG PO TABS: 50.0000 mg | ORAL_TABLET | Freq: Two times a day (BID) | ORAL | 3 refills | Status: DC

## 2016-12-27 NOTE — Patient Instructions (Signed)
Your physician recommends that you schedule a follow-up appointment in: 2 months with Dr.Koneswaran    Your physician has requested that you have an exercise tolerance test. For further information please visit [email or web address] Please also follow instruction sheet, as given.  HOLD METOPROLOL THE MORNING OF TEST   INCREASE Metoprol to 50 mg twice a day    You have been referred to Neurology for memory loss

## 2016-12-27 NOTE — Progress Notes (Signed)
CARDIOLOGY CONSULT NOTE  Patient ID: Hannah Rhodes MRN: [id number] DOB/AGE: [birth year] 36 y.o.  Admit date: (Not on file) Primary Physician: Patient, No Pcp Per Referring Physician: Dr. Donnetta Hutching  Reason for Consultation: chest pain and palpitations  HPI: Hannah Rhodes is a 36 y.o. female who is being seen today for the evaluation of Chest pain and palpitations at the request of Dr. Donnetta Hutching.   She has been evaluated in the ED on multiple occasions for this problem. Most recently, it occurred on 12/19/16. ED documentation supports she has been taking metoprolol 25 mg twice daily. She also drinks alcohol and smokes cigarettes.  Drug screen was positive for cocaine and benzodiazepines. CBC showed white blood cells 3.9 and normal hemoglobin of 12.8 area Comments of metabolic panel showed sodium mildly low at 131 and elevated AST of 76. Troponin and TSH were normal.  Chest x-ray showed no active cardiopulmonary disease.  ECG demonstrated sinus tachycardia, 107 bpm. ECG on 12/05/16 showed sinus tachycardia, 121 bpm. A urine drug screen was not performed at that time.  She tells me she does not usually use cocaine and only used on that one occasion.  She has been feeling fatigued for the past 3-4 months. She has been experiencing exertional dyspnea for 3 months when climbing stairs or walking to the bus stop. She said she doesn't always have chest pain. She primarily experiences chest pain when her heart is racing and this usually occurs when she is lying down. She denies any recent infection and denies fevers.  She denies syncope. There is no known history of hepatitis.  Her previous PCP passed away and she is in the process of obtaining a new one.  She is here with her mother who is also my patient.  She was apparently in a coma for about 3-4 weeks approximately 6 years ago while she was pregnant. She developed preeclampsia. She also developed pneumonia during her 27th week of  pregnancy.  Ever since then she has struggled with anxiety and panic attacks. She has poor short-term memory and is unable to recall conversations she had earlier in the day or the day before.     No Known Allergies  Current Outpatient Prescriptions  Medication Sig Dispense Refill  . amLODipine (NORVASC) 5 MG tablet Take 5 mg by mouth daily.    Marland Kitchen buPROPion (WELLBUTRIN XL) 300 MG 24 hr tablet Take 300 mg by mouth daily.    . metoprolol tartrate (LOPRESSOR) 25 MG tablet bid (Patient taking differently: 25 mg 2 (two) times daily. bid) 60 tablet 0   No current facility-administered medications for this visit.     Past Medical History:  Diagnosis Date  . Anxiety   . Generalized headaches   . HPV (human papilloma virus) infection   . Hypertension   . Panic attack   . Tachycardia     Past Surgical History:  Procedure Laterality Date  . CESAREAN SECTION    . CESAREAN SECTION    . SMALL INTESTINE SURGERY      Social History   Social History  . Marital status: Single    Spouse name: N/A  . Number of children: N/A  . Years of education: N/A   Occupational History  . Not on file.   Social History Main Topics  . Smoking status: Current Every Day Smoker    Packs/day: 0.50  . Smokeless tobacco: Never Used  . Alcohol use Yes     Comment: occ  .  Drug use: Yes    Types: Marijuana  . Sexual activity: No   Other Topics Concern  . Not on file   Social History Narrative  . No narrative on file     No family history of premature CAD in 1st degree relatives.  Current Meds  Medication Sig  . amLODipine (NORVASC) 5 MG tablet Take 5 mg by mouth daily.  Marland Kitchen buPROPion (WELLBUTRIN XL) 300 MG 24 hr tablet Take 300 mg by mouth daily.  . metoprolol tartrate (LOPRESSOR) 25 MG tablet bid (Patient taking differently: 25 mg 2 (two) times daily. bid)      Review of systems complete and found to be negative unless listed above in HPI    Physical exam Blood pressure 116/82, pulse  (!) 114, height  (1.473 m), weight 96 lb 12.8 oz (43.9 kg), last menstrual period 12/05/2016, SpO2 94 %. General: NAD Neck: No JVD, no thyromegaly or thyroid nodule.  Lungs: Clear to auscultation bilaterally with normal respiratory effort. CV: Nondisplaced PMI. Mildly tachycardic, regular rhythm, normal S1/S2, no S3/S4, no murmur.  No peripheral edema.  No carotid bruit.    Abdomen: Soft, nontender, no distention.  Skin: Intact without lesions or rashes.  Neurologic: Alert and oriented x 3.  Psych: Normal affect. Extremities: No clubbing or cyanosis.  HEENT: Normal.   ECG: Most recent ECG reviewed.   Labs: Lab Results  Component Value Date/Time   K 4.2 12/19/2016 10:25 AM   BUN 7 12/19/2016 10:25 AM   CREATININE 0.66 12/19/2016 10:25 AM   ALT 38 12/19/2016 10:25 AM   TSH 0.606 12/19/2016 10:25 AM   HGB 12.8 12/19/2016 10:25 AM     Lipids: No results found for: LDLCALC, LDLDIRECT, CHOL, TRIG, HDL      ASSESSMENT AND PLAN:  1. Tachycardia and palpitations: I will increase metoprolol to 50 mg twice daily. 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 She denies cocaine use confirms that was an isolated instance. Her tachycardia may be the result of neurologic damage suffered during a coma 6 years ago.  2. Exertional dyspnea and chest pain: Risk factors for ischemic heart disease include hypertension and tobacco use. I will obtain an exercise treadmill stress test. I will have her hold metoprolol on the morning of study.  3. Short-term memory loss: I will make a neurology referral as this apparently occurred after a coma.  4. Tobacco use disorder: She smokes a half pack of cigarettes daily and has done so for over 20 years.     Disposition: Follow up in 2 months  Signed: Prentice Docker, M.D., F.A.C.C.  12/27/2016, 10:05 AM

## 2016-12-28 ENCOUNTER — Encounter: Payer: Self-pay | Admitting: Psychology

## 2017-01-08 ENCOUNTER — Encounter: Payer: Self-pay | Admitting: Cardiovascular Disease

## 2017-01-10 ENCOUNTER — Ambulatory Visit (HOSPITAL_COMMUNITY)
Admission: RE | Admit: 2017-01-10 | Discharge: 2017-01-10 | Disposition: A | Discharge status: Home / Self Care | Payer: Medicaid Other | Source: Ambulatory Visit | Attending: Cardiovascular Disease | Admitting: Cardiovascular Disease

## 2017-01-10 DIAGNOSIS — R0609 Other forms of dyspnea: Secondary | ICD-10-CM | POA: Insufficient documentation

## 2017-01-10 DIAGNOSIS — R079 Chest pain, unspecified: Secondary | ICD-10-CM | POA: Insufficient documentation

## 2017-01-11 LAB — EXERCISE TOLERANCE TEST
CHL CUP MPHR: 183 {beats}/min
CHL CUP RESTING HR STRESS: 77 {beats}/min
CSEPHR: 90 %
CSEPPHR: 166 {beats}/min
Estimated workload: 10.1 METS
Exercise duration (min): 9 min
Exercise duration (sec): 0 s
RPE: 13

## 2017-01-22 ENCOUNTER — Encounter: Payer: Self-pay | Admitting: Cardiovascular Disease

## 2017-02-26 ENCOUNTER — Ambulatory Visit: Payer: Medicaid Other | Admitting: Cardiovascular Disease

## 2017-03-19 ENCOUNTER — Emergency Department (HOSPITAL_COMMUNITY)
Admission: EM | Admit: 2017-03-19 | Discharge: 2017-03-20 | Disposition: A | Discharge status: Home / Self Care | Payer: Medicaid Other | Attending: Emergency Medicine | Admitting: Emergency Medicine

## 2017-03-19 ENCOUNTER — Other Ambulatory Visit: Payer: Self-pay

## 2017-03-19 ENCOUNTER — Emergency Department (HOSPITAL_COMMUNITY): Payer: Medicaid Other

## 2017-03-19 ENCOUNTER — Encounter (HOSPITAL_COMMUNITY): Payer: Self-pay | Admitting: *Deleted

## 2017-03-19 DIAGNOSIS — R079 Chest pain, unspecified: Secondary | ICD-10-CM | POA: Diagnosis present

## 2017-03-19 DIAGNOSIS — R0602 Shortness of breath: Secondary | ICD-10-CM | POA: Insufficient documentation

## 2017-03-19 DIAGNOSIS — R740 Nonspecific elevation of levels of transaminase and lactic acid dehydrogenase [LDH]: Secondary | ICD-10-CM | POA: Insufficient documentation

## 2017-03-19 DIAGNOSIS — R0789 Other chest pain: Secondary | ICD-10-CM | POA: Diagnosis not present

## 2017-03-19 DIAGNOSIS — Z79899 Other long term (current) drug therapy: Secondary | ICD-10-CM | POA: Insufficient documentation

## 2017-03-19 DIAGNOSIS — R1011 Right upper quadrant pain: Secondary | ICD-10-CM | POA: Diagnosis not present

## 2017-03-19 DIAGNOSIS — F17200 Nicotine dependence, unspecified, uncomplicated: Secondary | ICD-10-CM | POA: Diagnosis not present

## 2017-03-19 DIAGNOSIS — I10 Essential (primary) hypertension: Secondary | ICD-10-CM | POA: Insufficient documentation

## 2017-03-19 DIAGNOSIS — R7401 Elevation of levels of liver transaminase levels: Secondary | ICD-10-CM

## 2017-03-19 LAB — BASIC METABOLIC PANEL
Anion gap: 19 — ABNORMAL HIGH (ref 5–15)
BUN: 7 mg/dL (ref 6–20)
CO2: 20 mmol/L — ABNORMAL LOW (ref 22–32)
CREATININE: 0.65 mg/dL (ref 0.44–1.00)
Calcium: 9 mg/dL (ref 8.9–10.3)
Chloride: 96 mmol/L — ABNORMAL LOW (ref 101–111)
GFR calc Af Amer: >60 mL/min (ref >60)
GLUCOSE: 118 mg/dL — AB (ref 65–99)
POTASSIUM: 3.5 mmol/L (ref 3.5–5.1)
SODIUM: 135 mmol/L (ref 135–145)

## 2017-03-19 LAB — HEPATIC FUNCTION PANEL
ALBUMIN: 3.9 g/dL (ref 3.5–5.0)
ALK PHOS: 170 U/L — AB (ref 38–126)
ALT: 113 U/L — ABNORMAL HIGH (ref 14–54)
AST: 249 U/L — AB (ref 15–41)
BILIRUBIN DIRECT: 0.4 mg/dL (ref 0.1–0.5)
BILIRUBIN TOTAL: 1.2 mg/dL (ref 0.3–1.2)
Indirect Bilirubin: 0.8 mg/dL (ref 0.3–0.9)
Total Protein: 7.6 g/dL (ref 6.5–8.1)

## 2017-03-19 LAB — CBC
HCT: 37.3 % (ref 36.0–46.0)
Hemoglobin: 12.6 g/dL (ref 12.0–15.0)
MCH: 31.3 pg (ref 26.0–34.0)
MCHC: 33.8 g/dL (ref 30.0–36.0)
MCV: 92.8 fL (ref 78.0–100.0)
PLATELETS: 210 10*3/uL (ref 150–400)
RBC: 4.02 MIL/uL (ref 3.87–5.11)
RDW: 17.1 % — AB (ref 11.5–15.5)
WBC: 5.3 10*3/uL (ref 4.0–10.5)

## 2017-03-19 LAB — I-STAT BETA HCG BLOOD, ED (MC, WL, AP ONLY): I-stat hCG, quantitative: <5 m[IU]/mL (ref <5)

## 2017-03-19 LAB — LIPASE, BLOOD: Lipase: 58 U/L — ABNORMAL HIGH (ref 11–51)

## 2017-03-19 LAB — I-STAT TROPONIN, ED: Troponin i, poc: 0 ng/mL (ref 0.00–0.08)

## 2017-03-19 LAB — D-DIMER, QUANTITATIVE (NOT AT ARMC): D DIMER QUANT: 0.54 ug{FEU}/mL — AB (ref 0.00–0.50)

## 2017-03-19 MED ORDER — SODIUM CHLORIDE 0.9 % IV BOLUS (SEPSIS)
1000.0000 mL | Freq: Once | INTRAVENOUS | Status: AC
  Administered 2017-03-20: 1000 mL via INTRAVENOUS

## 2017-03-19 MED ORDER — ONDANSETRON HCL 4 MG/2ML IJ SOLN
4.0000 mg | Freq: Once | INTRAMUSCULAR | Status: AC
  Administered 2017-03-19: 4 mg via INTRAVENOUS
  Filled 2017-03-19: qty 2

## 2017-03-19 MED ORDER — IPRATROPIUM-ALBUTEROL 0.5-2.5 (3) MG/3ML IN SOLN
3.0000 mL | Freq: Once | RESPIRATORY_TRACT | Status: AC
  Administered 2017-03-19: 3 mL via RESPIRATORY_TRACT
  Filled 2017-03-19: qty 3

## 2017-03-19 NOTE — ED Provider Notes (Signed)
[facility] EMERGENCY DEPARTMENT Provider Note   CSN: [id number] Arrival date & time: 03/19/17  2208     History   Chief Complaint Chief Complaint  Patient presents with  . Chest Pain    HPI Hannah Rhodes is a 37 y.o. female who presents with chest pain and shortness of breath.  Past medical history significant for hypertension, chronic tachycardia, anxiety,  tobacco abuse.  Patient states that about 3 hours ago she had an acute onset of substernal chest pain while she was driving.  The pain comes and goes.  It feels like a pressure.  The pain radiates to her left shoulder.  She also has shortness of breath and nausea.  It feels similar to prior episodes of chest pain.  She is established with cardiology and had a recent low risk stress test in October.  She is prescribed metoprolol for her chronic palpitations which generally improve her symptoms however tonight it did not therefore she came to the ED.  She denies fever, chills, URI symptoms, cough, abdominal pain, vomiting, leg swelling.  Of note she has tested positive for cocaine in the past. She denies significant alcohol or drug use.  HPI  Past Medical History:  Diagnosis Date  . Anxiety   . Generalized headaches   . HPV (human papilloma virus) infection   . Hypertension   . Panic attack   . Tachycardia     Patient Active Problem List   Diagnosis Date Noted  . DYSPNEA 10/24/2009  . ANXIETY STATE, UNSPECIFIED 10/21/2009  . NONDEPENDENT TOBACCO USE DISORDER 10/21/2009  . INSOMNIA UNSPECIFIED 10/21/2009  . PALPITATIONS 10/21/2009    Past Surgical History:  Procedure Laterality Date  . CESAREAN SECTION    . CESAREAN SECTION    . SMALL INTESTINE SURGERY      OB History    No data available       Home Medications    Prior to Admission medications   Medication Sig Start Date End Date Taking? Authorizing Provider  amLODipine (NORVASC) 5 MG tablet Take 5 mg by mouth daily.    [provider]  buPROPion  (WELLBUTRIN XL) 300 MG 24 hr tablet Take 300 mg by mouth daily.    [provider]  metoprolol tartrate (LOPRESSOR) 50 MG tablet Take 1 tablet (50 mg total) by mouth 2 (two) times daily. 12/27/16 03/27/17  Laqueta Linden, MD    Family History Family History  Problem Relation Age of Onset  . Anxiety disorder Sister   . Restless legs syndrome Sister   . Heart failure Mother   . CAD Mother   . Heart attack Mother     Social History Social History   Tobacco Use  . Smoking status: Current Every Day Smoker    Packs/day: 0.50  . Smokeless tobacco: Never Used  Substance Use Topics  . Alcohol use: Yes    Comment: occ  . Drug use: Yes    Types: Marijuana     Allergies   Patient has no known allergies.   Review of Systems Review of Systems  Constitutional: Negative for chills and fever.  Respiratory: Positive for shortness of breath. Negative for cough.   Cardiovascular: Positive for chest pain and palpitations. Negative for leg swelling.  Gastrointestinal: Positive for nausea. Negative for abdominal pain, diarrhea and vomiting.  Genitourinary: Negative for dysuria.  Neurological: Negative for syncope and light-headedness.  Psychiatric/Behavioral: The patient is nervous/anxious.   All other systems reviewed and are negative.  Physical Exam Updated Vital Signs BP (!) 164/105 (BP Location: Left Arm)   Pulse 92   Temp 99.1 F (37.3 C) (Oral)   Resp 20   Ht 4\' 10"  (1.473 m)   Wt 45.4 kg (100 lb)   LMP 02/27/2017   SpO2 99%   BMI 20.90 kg/m   Physical Exam  Constitutional: She is oriented to person, place, and time. She appears well-developed and well-nourished. No distress.  HENT:  Head: Normocephalic and atraumatic.  Eyes: Conjunctivae are normal. Pupils are equal, round, and reactive to light. Right eye exhibits no discharge. Left eye exhibits no discharge. No scleral icterus.  Neck: Normal range of motion.  Cardiovascular: Normal rate and regular  rhythm. Exam reveals no gallop and no friction rub.  No murmur heard. Pulmonary/Chest: Effort normal. No stridor. No respiratory distress. She has wheezes. She has no rales. She exhibits tenderness (sternal tenderness).  Abdominal: Soft. Bowel sounds are normal. She exhibits no distension and no mass. There is tenderness (epigastric and periumbilical). There is no rebound and no guarding. No hernia.  Neurological: She is alert and oriented to person, place, and time.  Skin: Skin is warm and dry.  Psychiatric: Her behavior is normal. Her mood appears anxious.  Nursing note and vitals reviewed.    ED Treatments / Results  Labs (all labs ordered are listed, but only abnormal results are displayed) Labs Reviewed  BASIC METABOLIC PANEL - Abnormal; Notable for the following components:      Result Value   Chloride 96 (*)    CO2 20 (*)    Glucose, Bld 118 (*)    Anion gap 19 (*)    All other components within normal limits  CBC - Abnormal; Notable for the following components:   RDW 17.1 (*)    All other components within normal limits  HEPATIC FUNCTION PANEL - Abnormal; Notable for the following components:   AST 249 (*)    ALT 113 (*)    Alkaline Phosphatase 170 (*)    All other components within normal limits  LIPASE, BLOOD - Abnormal; Notable for the following components:   Lipase 58 (*)    All other components within normal limits  RAPID URINE DRUG SCREEN, HOSP PERFORMED  I-STAT TROPONIN, ED  I-STAT BETA HCG BLOOD, ED ([facility], [facility], [facility] ONLY)    EKG  EKG Interpretation  Date/Time:  Tuesday March 19 2017 22:20:47 EST Ventricular Rate:  90 PR Interval:    QRS Duration: 80 QT Interval:  404 QTC Calculation: 495 R Axis:   45 Text Interpretation:  Sinus rhythm Borderline prolonged QT interval Confirmed by Bethann BerkshireZammit, Joseph [phone]([id number]) on 03/19/2017 10:23:23 PM       Radiology Dg Chest 2 View  Result Date: 03/19/2017 CLINICAL DATA:  Chest pain radiating to the left shoulder since 8  p.m. Cough, shortness of breath, congestion. History of hypertension and tachycardia. EXAM: CHEST  2 VIEW COMPARISON:  12/19/2016 FINDINGS: The heart size and mediastinal contours are within normal limits. Both lungs are clear. The visualized skeletal structures are unremarkable. IMPRESSION: No active cardiopulmonary disease. Electronically Signed   By: Burman NievesWilliam  Stevens M.D.   On: 03/19/2017 22:50    Procedures Procedures (including critical care time)   Medications Ordered in ED Medications  ipratropium-albuterol (DUONEB) 0.5-2.5 (3) MG/3ML nebulizer solution 3 mL (3 mLs Nebulization Given 03/19/17 2311)  ondansetron (ZOFRAN) injection 4 mg (4 mg Intravenous Given 03/19/17 2304)    Initial Impression / Assessment and Plan / ED Course  I have reviewed the triage vital signs and the nursing notes.  Pertinent labs & imaging results that were available during my care of the patient were reviewed by me and considered in my medical decision making (see chart for details).  37 year old female presents with atypical chest pain and SOB. She has chronic chest pain and it is similar to prior episodes. The main difference is it is lasting longer. She is hypertensive but otherwise vitals are normal. Chest pain work up was initiated and added LFT, lipase, UDS due to her abdominal pain on exam as well as her hx of cocaine use. EKG is NSR.   11:48 PM CBC is normal. CMP is remarkable for low CO2 (20), elevated anion gap (19), and abnormal LFTs. On exam she has periumbilical and epigastric tenderness. Shared visit with Dr. Manus Gunning. D-dimer was added and she had some RUQ tenderness as well. Will order CT pending d-dimer results. 1L bolus IVF given.  12:20 AM D-dimer is minimally elevated. CTA of chest and abdomen/pelvis ordered. She will need delta trop as well which is due at ~1:20AM. Care was signed out to Dr. Manus Gunning who will follow up on results.   Final Clinical Impressions(s) / ED Diagnoses   Final  diagnoses:  Atypical chest pain  Elevated transaminase level    ED Discharge Orders    None       Bethel Born, PA-C 03/20/17 1610    Glynn Octave, MD 03/20/17 1818

## 2017-03-19 NOTE — ED Triage Notes (Signed)
Pt c/o chest pain with radiation down left arm and up shoulder that started x 3 hours ago while driving home; pt describes the pain as an ache and c/o some sob

## 2017-03-20 ENCOUNTER — Emergency Department (HOSPITAL_COMMUNITY): Payer: Medicaid Other

## 2017-03-20 LAB — RAPID URINE DRUG SCREEN, HOSP PERFORMED
Amphetamines: NOT DETECTED
BARBITURATES: NOT DETECTED
BENZODIAZEPINES: POSITIVE — AB
COCAINE: NOT DETECTED
Opiates: NOT DETECTED
Tetrahydrocannabinol: NOT DETECTED

## 2017-03-20 LAB — TROPONIN I: Troponin I: <0.03 ng/mL (ref <0.03)

## 2017-03-20 MED ORDER — LORAZEPAM 1 MG PO TABS
1.0000 mg | ORAL_TABLET | Freq: Once | ORAL | Status: AC
  Administered 2017-03-20: 1 mg via ORAL
  Filled 2017-03-20: qty 1

## 2017-03-20 MED ORDER — IOPAMIDOL (ISOVUE-370) INJECTION 76%
100.0000 mL | Freq: Once | INTRAVENOUS | Status: AC | PRN
  Administered 2017-03-20: 100 mL via INTRAVENOUS

## 2017-03-20 MED ORDER — OMEPRAZOLE 20 MG PO CPDR: 20.0000 mg | DELAYED_RELEASE_CAPSULE | Freq: Every day | ORAL | 0 refills | Status: DC

## 2017-03-20 NOTE — Discharge Instructions (Addendum)
There is no evidence of heart attack or blood clot in the lung. Return tomorrow for an ultrasound of your gallbladder. Return to the ED if you develop new or worsening symptoms.

## 2017-03-22 ENCOUNTER — Ambulatory Visit (HOSPITAL_COMMUNITY)
Admission: RE | Admit: 2017-03-22 | Discharge: 2017-03-22 | Disposition: A | Discharge status: Home / Self Care | Payer: Medicaid Other | Source: Ambulatory Visit | Attending: Emergency Medicine | Admitting: Emergency Medicine

## 2017-03-22 DIAGNOSIS — R1011 Right upper quadrant pain: Secondary | ICD-10-CM | POA: Diagnosis present

## 2017-03-22 DIAGNOSIS — K760 Fatty (change of) liver, not elsewhere classified: Secondary | ICD-10-CM | POA: Insufficient documentation

## 2017-04-10 ENCOUNTER — Ambulatory Visit: Payer: Medicaid Other | Admitting: Cardiovascular Disease

## 2017-05-09 ENCOUNTER — Emergency Department (HOSPITAL_COMMUNITY)
Admission: EM | Admit: 2017-05-09 | Discharge: 2017-05-09 | Disposition: A | Discharge status: Home / Self Care | Payer: Medicaid Other | Attending: Emergency Medicine | Admitting: Emergency Medicine

## 2017-05-09 ENCOUNTER — Emergency Department (HOSPITAL_COMMUNITY): Payer: Medicaid Other

## 2017-05-09 ENCOUNTER — Encounter (HOSPITAL_COMMUNITY): Payer: Self-pay | Admitting: Emergency Medicine

## 2017-05-09 DIAGNOSIS — R112 Nausea with vomiting, unspecified: Secondary | ICD-10-CM

## 2017-05-09 DIAGNOSIS — F17200 Nicotine dependence, unspecified, uncomplicated: Secondary | ICD-10-CM | POA: Diagnosis not present

## 2017-05-09 DIAGNOSIS — K529 Noninfective gastroenteritis and colitis, unspecified: Secondary | ICD-10-CM | POA: Diagnosis not present

## 2017-05-09 DIAGNOSIS — I10 Essential (primary) hypertension: Secondary | ICD-10-CM | POA: Insufficient documentation

## 2017-05-09 DIAGNOSIS — Z79899 Other long term (current) drug therapy: Secondary | ICD-10-CM | POA: Diagnosis not present

## 2017-05-09 DIAGNOSIS — R079 Chest pain, unspecified: Secondary | ICD-10-CM

## 2017-05-09 DIAGNOSIS — R1013 Epigastric pain: Secondary | ICD-10-CM

## 2017-05-09 LAB — BASIC METABOLIC PANEL WITH GFR
Anion gap: 14 (ref 5–15)
BUN: 6 mg/dL (ref 6–20)
CO2: 24 mmol/L (ref 22–32)
Calcium: 8.9 mg/dL (ref 8.9–10.3)
Chloride: 96 mmol/L — ABNORMAL LOW (ref 101–111)
Creatinine, Ser: 0.66 mg/dL (ref 0.44–1.00)
GFR calc Af Amer: >60 mL/min
GFR calc non Af Amer: >60 mL/min
Glucose, Bld: 100 mg/dL — ABNORMAL HIGH (ref 65–99)
Potassium: 5 mmol/L (ref 3.5–5.1)
Sodium: 134 mmol/L — ABNORMAL LOW (ref 135–145)

## 2017-05-09 LAB — CBC WITH DIFFERENTIAL/PLATELET
BASOS ABS: 0.1 10*3/uL (ref 0.0–0.1)
BASOS PCT: 1 %
Eosinophils Absolute: 0 10*3/uL (ref 0.0–0.7)
Eosinophils Relative: 0 %
HEMATOCRIT: 39.2 % (ref 36.0–46.0)
HEMOGLOBIN: 13 g/dL (ref 12.0–15.0)
LYMPHS PCT: 43 %
Lymphs Abs: 2.1 10*3/uL (ref 0.7–4.0)
MCH: 34 pg (ref 26.0–34.0)
MCHC: 33.2 g/dL (ref 30.0–36.0)
MCV: 102.6 fL — ABNORMAL HIGH (ref 78.0–100.0)
Monocytes Absolute: 0.4 10*3/uL (ref 0.1–1.0)
Monocytes Relative: 8 %
NEUTROS ABS: 2.4 10*3/uL (ref 1.7–7.7)
NEUTROS PCT: 48 %
Platelets: 230 10*3/uL (ref 150–400)
RBC: 3.82 MIL/uL — AB (ref 3.87–5.11)
RDW: 17 % — ABNORMAL HIGH (ref 11.5–15.5)
WBC: 4.9 10*3/uL (ref 4.0–10.5)

## 2017-05-09 LAB — HEPATIC FUNCTION PANEL
ALBUMIN: 4.2 g/dL (ref 3.5–5.0)
ALK PHOS: 122 U/L (ref 38–126)
ALT: 69 U/L — AB (ref 14–54)
AST: 158 U/L — AB (ref 15–41)
Bilirubin, Direct: 0.2 mg/dL (ref 0.1–0.5)
Indirect Bilirubin: 0.7 mg/dL (ref 0.3–0.9)
TOTAL PROTEIN: 8 g/dL (ref 6.5–8.1)
Total Bilirubin: 0.9 mg/dL (ref 0.3–1.2)

## 2017-05-09 LAB — PREGNANCY, URINE: Preg Test, Ur: NEGATIVE

## 2017-05-09 LAB — LIPASE, BLOOD: Lipase: 26 U/L (ref 11–51)

## 2017-05-09 MED ORDER — ONDANSETRON HCL 4 MG/2ML IJ SOLN
4.0000 mg | Freq: Once | INTRAMUSCULAR | Status: AC
Start: 2017-05-09 — End: 2017-05-09
  Administered 2017-05-09: 4 mg via INTRAVENOUS
  Filled 2017-05-09: qty 2

## 2017-05-09 MED ORDER — PANTOPRAZOLE SODIUM 40 MG IV SOLR
40.0000 mg | Freq: Once | INTRAVENOUS | Status: AC
Start: 2017-05-09 — End: 2017-05-09
  Administered 2017-05-09: 40 mg via INTRAVENOUS
  Filled 2017-05-09: qty 40

## 2017-05-09 MED ORDER — SODIUM CHLORIDE 0.9 % IV BOLUS (SEPSIS)
1000.0000 mL | Freq: Once | INTRAVENOUS | Status: AC
  Administered 2017-05-09: 1000 mL via INTRAVENOUS

## 2017-05-09 MED ORDER — ONDANSETRON 4 MG PO TBDP: 4.0000 mg | ORAL_TABLET | Freq: Three times a day (TID) | ORAL | 0 refills | Status: DC | PRN

## 2017-05-09 MED ORDER — OMEPRAZOLE 20 MG PO CPDR: 20.0000 mg | DELAYED_RELEASE_CAPSULE | Freq: Two times a day (BID) | ORAL | 1 refills | Status: DC

## 2017-05-09 NOTE — Discharge Instructions (Signed)
Alcohol, tobacco, caffeine, and anti-inflammatory medicines like Motrin or aspirin.  Follow-up with Dr. fields if not improving.  Prilosec as prescribed.  Zofran for nausea.

## 2017-05-09 NOTE — ED Provider Notes (Signed)
[facility] EMERGENCY DEPARTMENT Provider Note   CSN: [id number] Arrival date & time: 05/09/17  1818     History   Chief Complaint Chief Complaint  Patient presents with  . Emesis    HPI Hannah Rhodes is a 37 y.o. female.  HPI complaint is vomiting, concern for pregnancy.  37 year old female.  States she has had nausea and bowel days.  Some epigastric burning.  States that her last episode of emesis had some discomfort in her chest.  States that she had 2 pregnancies tests at home.  One that was equivocal and 1 that was negative.  Last menstrual period was January.  Heme-negative nonbilious emesis.  No alcohol use.  Daily smoker.  Denies marijuana use.  Past Medical History:  Diagnosis Date  . Anxiety   . Generalized headaches   . HPV (human papilloma virus) infection   . Hypertension   . Panic attack   . Tachycardia     Patient Active Problem List   Diagnosis Date Noted  . DYSPNEA 10/24/2009  . ANXIETY STATE, UNSPECIFIED 10/21/2009  . NONDEPENDENT TOBACCO USE DISORDER 10/21/2009  . INSOMNIA UNSPECIFIED 10/21/2009  . PALPITATIONS 10/21/2009    Past Surgical History:  Procedure Laterality Date  . CESAREAN SECTION    . CESAREAN SECTION    . SMALL INTESTINE SURGERY      OB History    No data available       Home Medications    Prior to Admission medications   Medication Sig Start Date End Date Taking? Authorizing Provider  amLODipine (NORVASC) 5 MG tablet Take 5 mg by mouth daily.    [provider]  buPROPion (WELLBUTRIN XL) 300 MG 24 hr tablet Take 300 mg by mouth daily.    [provider]  metoprolol tartrate (LOPRESSOR) 50 MG tablet Take 1 tablet (50 mg total) by mouth 2 (two) times daily. 12/27/16 03/27/17  Laqueta Linden, MD  omeprazole (PRILOSEC) 20 MG capsule Take 1 capsule (20 mg total) by mouth 2 (two) times daily. 05/09/17   Rolland Porter, MD  ondansetron (ZOFRAN ODT) 4 MG disintegrating tablet Take 1 tablet (4 mg total) by  mouth every 8 (eight) hours as needed for nausea. 05/09/17   Rolland Porter, MD    Family History Family History  Problem Relation Age of Onset  . Anxiety disorder Sister   . Restless legs syndrome Sister   . Heart failure Mother   . CAD Mother   . Heart attack Mother     Social History Social History   Tobacco Use  . Smoking status: Current Every Day Smoker    Packs/day: 0.50  . Smokeless tobacco: Never Used  Substance Use Topics  . Alcohol use: Yes    Comment: occ  . Drug use: Yes    Types: Marijuana     Allergies   Patient has no known allergies.   Review of Systems Review of Systems  Constitutional: Negative for appetite change, chills, diaphoresis, fatigue and fever.  HENT: Negative for mouth sores, sore throat and trouble swallowing.   Eyes: Negative for visual disturbance.  Respiratory: Negative for cough, chest tightness, shortness of breath and wheezing.   Cardiovascular: Negative for chest pain.  Gastrointestinal: Positive for abdominal pain, nausea and vomiting. Negative for diarrhea.  Endocrine: Negative for polydipsia, polyphagia and polyuria.  Genitourinary: Negative for dysuria, frequency and hematuria.  Musculoskeletal: Negative for gait problem.  Skin: Negative for color change, pallor and rash.  Neurological: Negative for dizziness, syncope,  light-headedness and headaches.  Hematological: Does not bruise/bleed easily.  Psychiatric/Behavioral: Negative for behavioral problems and confusion.     Physical Exam Updated Vital Signs BP (!) 142/96 (BP Location: Left Arm)   Pulse 81   Temp 99.2 F (37.3 C) (Oral)   Resp 16   Ht 4\' 10"  (1.473 m)   Wt 46.7 kg (103 lb)   LMP 04/19/2017   SpO2 99%   BMI 21.53 kg/m   Physical Exam  Constitutional: She is oriented to person, place, and time. She appears well-developed and well-nourished. No distress.  HENT:  Head: Normocephalic.  Eyes: Conjunctivae are normal. Pupils are equal, round, and reactive  to light. No scleral icterus.  Neck: Normal range of motion. Neck supple. No thyromegaly present.  Cardiovascular: Normal rate and regular rhythm. Exam reveals no gallop and no friction rub.  No murmur heard. Pulmonary/Chest: Effort normal and breath sounds normal. No respiratory distress. She has no wheezes. She has no rales.  Abdominal: Soft. Bowel sounds are normal. She exhibits no distension. There is no tenderness. There is no rebound.  Points to epigastrium.  No peritoneal irritation.  Musculoskeletal: Normal range of motion.  Neurological: She is alert and oriented to person, place, and time.  Skin: Skin is warm and dry. No rash noted.  Psychiatric: She has a normal mood and affect. Her behavior is normal.     ED Treatments / Results  Labs (all labs ordered are listed, but only abnormal results are displayed) Labs Reviewed  CBC WITH DIFFERENTIAL/PLATELET - Abnormal; Notable for the following components:      Result Value   RBC 3.82 (*)    MCV 102.6 (*)    RDW 17.0 (*)    All other components within normal limits  BASIC METABOLIC PANEL - Abnormal; Notable for the following components:   Sodium 134 (*)    Chloride 96 (*)    Glucose, Bld 100 (*)    All other components within normal limits  HEPATIC FUNCTION PANEL - Abnormal; Notable for the following components:   AST 158 (*)    ALT 69 (*)    All other components within normal limits  PREGNANCY, URINE  LIPASE, BLOOD    EKG  EKG Interpretation None       Radiology Dg Chest Port 1 View  Result Date: 05/09/2017 CLINICAL DATA:  37 year old female with chest pain. EXAM: PORTABLE CHEST 1 VIEW COMPARISON:  Chest CT dated 03/20/2017 FINDINGS: The heart size and mediastinal contours are within normal limits. Both lungs are clear. The visualized skeletal structures are unremarkable. IMPRESSION: No active disease. Electronically Signed   By: Elgie Collard M.D.   On: 05/09/2017 22:04    Procedures Procedures (including  critical care time)  Medications Ordered in ED Medications  sodium chloride 0.9 % bolus 1,000 mL (1,000 mLs Intravenous New Bag/Given 05/09/17 2136)  ondansetron [facility]) injection 4 mg (4 mg Intravenous Given 05/09/17 2137)  pantoprazole (PROTONIX) injection 40 mg (40 mg Intravenous Given 05/09/17 2136)     Initial Impression / Assessment and Plan / ED Course  I have reviewed the triage vital signs and the nursing notes.  Pertinent labs & imaging results that were available during my care of the patient were reviewed by me and considered in my medical decision making (see chart for details).    Not pregnant.  Reassuring labs.  Given IV fluids, PPI, antiemetics.  Taking p.o.  Plan discharge home.  Pregnancy is negative.  Prescription Prilosec, Zofran, GI follow-up  if not improving.  Final Clinical Impressions(s) / ED Diagnoses   Final diagnoses:  Non-intractable vomiting with nausea, unspecified vomiting type  Epigastric pain  Gastroenteritis    ED Discharge Orders        Ordered    ondansetron (ZOFRAN ODT) 4 MG disintegrating tablet  Every 8 hours PRN     05/09/17 2259    omeprazole (PRILOSEC) 20 MG capsule  2 times daily     05/09/17 2259       Rolland PorterJames, Marcele Kosta, MD 05/09/17 2302

## 2017-05-09 NOTE — ED Triage Notes (Signed)
Patient complaining of vomiting x 8 days. States "It started as morning sickness but now I'm sick all day." States she took home pregnancy test that showed negative. Also complaining of chest pain that started after vomiting 30 minutes ago.

## 2017-05-14 ENCOUNTER — Encounter: Payer: Self-pay | Admitting: Cardiovascular Disease

## 2017-05-14 ENCOUNTER — Ambulatory Visit (INDEPENDENT_AMBULATORY_CARE_PROVIDER_SITE_OTHER): Payer: Medicaid Other | Admitting: Cardiovascular Disease

## 2017-05-14 VITALS — BP 134/94 | HR 84 | Ht <= 58 in | Wt 100.0 lb

## 2017-05-14 DIAGNOSIS — R079 Chest pain, unspecified: Secondary | ICD-10-CM | POA: Diagnosis not present

## 2017-05-14 DIAGNOSIS — R0609 Other forms of dyspnea: Secondary | ICD-10-CM

## 2017-05-14 DIAGNOSIS — Z9289 Personal history of other medical treatment: Secondary | ICD-10-CM | POA: Diagnosis not present

## 2017-05-14 DIAGNOSIS — R000 Tachycardia, unspecified: Secondary | ICD-10-CM | POA: Diagnosis not present

## 2017-05-14 DIAGNOSIS — I10 Essential (primary) hypertension: Secondary | ICD-10-CM

## 2017-05-14 DIAGNOSIS — Z716 Tobacco abuse counseling: Secondary | ICD-10-CM

## 2017-05-14 DIAGNOSIS — R002 Palpitations: Secondary | ICD-10-CM | POA: Diagnosis not present

## 2017-05-14 DIAGNOSIS — R413 Other amnesia: Secondary | ICD-10-CM | POA: Diagnosis not present

## 2017-05-14 MED ORDER — AMLODIPINE BESYLATE 5 MG PO TABS: 5.0000 mg | ORAL_TABLET | Freq: Every day | ORAL | 3 refills | Status: DC

## 2017-05-14 MED ORDER — VARENICLINE TARTRATE 0.5 MG X 11 & 1 MG X 42 PO MISC: ORAL | 0 refills | Status: DC

## 2017-05-14 NOTE — Progress Notes (Signed)
SUBJECTIVE: The patient returns for follow-up after undergoing cardiovascular testing performed for the evaluation of exertional dyspnea and chest pain.  I increased metoprolol to 50 mg bid at her last visit.  She underwent a low risk exercise tolerance test on 01/10/17 and exercised for 9 minutes.  She was again evaluated in the ED for chest pain on 03/19/17. I reviewed documentation, labs, and studies. Troponins were normal.   CT angiogram of the chest showed no pulmonary embolus nor aortic dissection. There were mild emphysematous changes in the lungs with no active disease. Fatty liver noted and scattered aortic calcifications.  She continues to experience exertional dyspnea when climbing stairs or when walking to the bus stop.  She is here with her mother who is also my patient.  She was apparently in a coma for about 3-4 weeks approximately 6 years ago while she was pregnant. She developed preeclampsia. She also developed pneumonia during her 27th week of pregnancy.  Ever since then she has struggled with anxiety and panic attacks. She has poor short-term memory and is unable to recall conversations she had earlier in the day or the day before.  We discussed her CT results. She has tried Wellbutrin and Chantix in the past, but had bad dreams.  Metoprolol alleviates palpitations about 30 minutes after taking it.  Review of Systems: As per "subjective", otherwise negative.  No Known Allergies  Current Outpatient Medications  Medication Sig Dispense Refill  . metoprolol tartrate (LOPRESSOR) 50 MG tablet Take 1 tablet (50 mg total) by mouth 2 (two) times daily. 180 tablet 3  . omeprazole (PRILOSEC) 20 MG capsule Take 1 capsule (20 mg total) by mouth 2 (two) times daily. 60 capsule 1  . ondansetron (ZOFRAN ODT) 4 MG disintegrating tablet Take 1 tablet (4 mg total) by mouth every 8 (eight) hours as needed for nausea. 6 tablet 0  . buPROPion (WELLBUTRIN XL) 300 MG 24 hr tablet  Take 300 mg by mouth daily.     No current facility-administered medications for this visit.     Past Medical History:  Diagnosis Date  . Anxiety   . Generalized headaches   . HPV (human papilloma virus) infection   . Hypertension   . Panic attack   . Tachycardia     Past Surgical History:  Procedure Laterality Date  . CESAREAN SECTION    . CESAREAN SECTION    . SMALL INTESTINE SURGERY      Social History   Socioeconomic History  . Marital status: Single    Spouse name: Not on file  . Number of children: Not on file  . Years of education: Not on file  . Highest education level: Not on file  Social Needs  . Financial resource strain: Not on file  . Food insecurity - worry: Not on file  . Food insecurity - inability: Not on file  . Transportation needs - medical: Not on file  . Transportation needs - non-medical: Not on file  Occupational History  . Not on file  Tobacco Use  . Smoking status: Current Every Day Smoker    Packs/day: 0.50  . Smokeless tobacco: Never Used  Substance and Sexual Activity  . Alcohol use: Yes    Comment: occ  . Drug use: Yes    Types: Marijuana    Comment: 2-3 weeks ago was last time she smoked   . Sexual activity: No  Other Topics Concern  . Not on file  Social History  Narrative  . Not on file     Vitals:   05/14/17 1408  BP: (!) 134/94  Pulse: 84  SpO2: 97%  Weight: 100 lb (45.4 kg)  Height: 4\' 10"  (1.473 m)    Wt Readings from Last 3 Encounters:  05/14/17 100 lb (45.4 kg)  05/09/17 103 lb (46.7 kg)  03/19/17 100 lb (45.4 kg)     PHYSICAL EXAM General: NAD HEENT: Normal. Neck: No JVD, no thyromegaly. Lungs: Clear to auscultation bilaterally with normal respiratory effort. CV: Regular rate and rhythm, normal S1/S2, no S3/S4, no murmur. No pretibial or periankle edema.   Abdomen: Soft, nontender, no distention.  Neurologic: Alert and oriented.  Psych: Normal affect. Skin: Normal. Musculoskeletal: No gross  deformities.    ECG: Most recent ECG reviewed.   Labs: Lab Results  Component Value Date/Time   K 5.0 05/09/2017 06:56 PM   BUN 6 05/09/2017 06:56 PM   CREATININE 0.66 05/09/2017 06:56 PM   ALT 69 (H) 05/09/2017 06:56 PM   TSH 0.606 12/19/2016 10:25 AM   HGB 13.0 05/09/2017 06:56 PM     Lipids: No results found for: LDLCALC, LDLDIRECT, CHOL, TRIG, HDL     ASSESSMENT AND PLAN: 1. Tachycardia and palpitations: Continue metoprolol 50 mg twice daily. 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 She denies cocaine use confirms that was an isolated instance. Her tachycardia may be the result of neurologic damage suffered during a coma 6 years ago.  2. Exertional dyspnea and chest pain: She underwent a low risk exercise tolerance test on 01/10/17 and exercised for 9 minutes. No further cardiac testing is indicated. I suspect her exertional dyspnea is due to emphysema from longterm tobacco use. We discussed tobacco cessation.  3. Short-term memory loss: I previously made a neurology referral as this apparently occurred after a coma.  4. Tobacco use disorder: She smokes a half pack of cigarettes daily and has done so for over 20 years. Cessation counseling provided (3 minutes). She has shortness of breath due to this alleviated when using her child's inhaler. I will start Chantix.  5. Hypertension: Diastolic BP is elevated. BP 142/96 in ED on 05/09/17. It was 164/105 on 03/19/17 in the ED. She requires additional medical therapy. I will start amlodipine 5 mg daily. She used this in the past and has some at home. I will provide a new prescription with refills.   Disposition: Follow up 6 months.   Prentice DockerSuresh Koneswaran, M.D., F.A.C.C.

## 2017-05-14 NOTE — Patient Instructions (Signed)
Medication Instructions:  RESTART NORVASC 5 MG DAILY    START THE CHANTIX STARTER KIT   Labwork: NONE  Testing/Procedures: NONE  Follow-Up: Your physician wants you to follow-up in: 6 MONTHS .  You will receive a reminder letter in the mail two months in advance. If you don't receive a letter, please call our office to schedule the follow-up appointment.   Any Other Special Instructions Will Be Listed Below (If Applicable).     If you need a refill on your cardiac medications before your next appointment, please call your pharmacy.

## 2017-06-04 ENCOUNTER — Encounter: Payer: Medicaid Other | Admitting: Psychology

## 2017-06-04 NOTE — Progress Notes (Deleted)
NEUROBEHAVIORAL STATUS EXAM   Name: Hannah Rhodes Date of Birth: Dec 03, 1980 Date of Interview: 06/04/2017  Reason for Referral:  Hannah Rhodes is a 37 y.o. female who is referred for neuropsychological evaluation by Dr. Prentice DockerSuresh Koneswaran of [facility] in [place] due to concerns about memory loss since being in a coma 6 years ago. This patient is accompanied in the office by her *** who supplements the history.  History of Presenting Problem:   Dr. Purvis SheffieldKoneswaran 05/14/2017 Tachycardia and palpitations - Her tachycardia may be the result of neurologic damage suffered duringa coma6 years ago. Has been worked up for exertional dyspnea and chest pain, suspects is due to emphysema from longterm tobacco use. She was apparently in a coma for about 3-4 weeks approximately 6 years ago while she was pregnant. She developed preeclampsia. She also developed pneumonia during her 27th week of pregnancy. Ever since then she has struggled with anxiety and panic attacks. She has poor short-term memory and is unable to recall conversationsshe had earlier in the day or the day before.   Onset/Course  Upon direct questioning, the patient/caregiver reported:   Forgetting recent conversations/events:  Repeating statements/questions: Misplacing/losing items: Forgetting appointments or other obligations: Forgetting to take medications:  Difficulty concentrating: Starting but not finishing tasks: Distracted easily: Processing information more slowly:  Word-finding difficulty: Word substitutions: Writing difficulty: Spelling difficulty: Comprehension difficulty:  Getting lost when driving: Making wrong turns when driving: Uncertain about directions when driving or passenger:    Family neuro hx: Any family hx dementia?   Current Functioning: Work:  Complex ADLs Driving: Medication management: Management of finances: Appointments: Cooking:     Medical/Physical complaints:  Any  hx of stroke/TIA, MI, LOC/TBI, Sz? Hx falls?  Balance, probs walking? Sleep: Insomnia? OSA? CPAP? REM sleep beh sx? Visual illusions/hallucinations? Appetite/Nutrition/Weight changes       Current mood:  Depressed mood Anxiety Stress  Behavioral disturbance/Personality change  Suicidal Ideation/Intention:   Psychiatric History: History of depression, anxiety, other MH disorder: History of MH treatment: History of SI: History of substance dependence/treatment:   Social History: Born/Raised:  Education: Occupational history: Marital history: Children: Alcohol:  Tobacco: SA:  Medical History: Past Medical History:  Diagnosis Date  . Anxiety   . Generalized headaches   . HPV (human papilloma virus) infection   . Hypertension   . Panic attack   . Tachycardia       Current Medications:  Outpatient Encounter Medications as of 06/04/2017  Medication Sig  . amLODipine (NORVASC) 5 MG tablet Take 1 tablet (5 mg total) by mouth daily.  Marland Kitchen. buPROPion (WELLBUTRIN XL) 300 MG 24 hr tablet Take 300 mg by mouth daily.  . metoprolol tartrate (LOPRESSOR) 50 MG tablet Take 1 tablet (50 mg total) by mouth 2 (two) times daily.  Marland Kitchen. omeprazole (PRILOSEC) 20 MG capsule Take 1 capsule (20 mg total) by mouth 2 (two) times daily.  . ondansetron (ZOFRAN ODT) 4 MG disintegrating tablet Take 1 tablet (4 mg total) by mouth every 8 (eight) hours as needed for nausea.  . varenicline (CHANTIX STARTING MONTH PAK) 0.5 MG X 11 & 1 MG X 42 tablet Take one 0.5 mg tablet by mouth once daily for 3 days, then increase to one 0.5 mg tablet twice daily for 4 days, then increase to one 1 mg tablet twice daily.   No facility-administered encounter medications on file as of 06/04/2017.      Behavioral Observations:   Appearance: Neatly, casually and appropriately dressed and groomed***  Gait: Ambulated independently, no gross abnormalities observed*** Speech: Fluent; normal rate, rhythm and volume. ***  word finding difficulty. Thought process: Linear, goal directed*** Affect: Full, anxious*** Interpersonal: Pleasant, appropriate***   *** minutes spent face-to-face with patient completing neurobehavioral status exam. *** minutes spent integrating medical records/clinical data and completing this report. [id number] unit; [id number]***.   TESTING: There is medical necessity to proceed with neuropsychological assessment as the results will be used to aid in differential diagnosis and clinical decision-making and to inform specific treatment recommendations. Per the patient, *** and medical records reviewed, there has been a change in cognitive functioning and a reasonable suspicion of dementia***.  Clinical Decision Making: In considering the patient's current level of functioning, level of presumed impairment, nature of symptoms, emotional and behavioral responses during the interview, level of literacy, and observed level of motivation, a battery of tests was selected and communicated to the psychometrician.   ***Option 1:  Following the clinical interview/neurobehavioral status exam, the patient completed this full battery of neuropsychological testing with my psychometrician under my supervision (see separate note).   PLAN: The patient will return to see me for a follow-up session at which time her test performances and my impressions and treatment recommendations will be reviewed in detail.  Evaluation ongoing; full report to follow.  ***Option 2:  PLAN: The patient will return to complete the above referenced full battery of neuropsychological testing with a psychometrician under my supervision. Education regarding testing procedures was provided to the patient. Subsequently, the patient will see this provider for a follow-up session at which time her test performances and my impressions and treatment recommendations will be reviewed in detail.  Evaluation ongoing; full report to  follow.

## 2017-06-18 ENCOUNTER — Encounter: Payer: Medicaid Other | Admitting: Psychology

## 2017-08-30 ENCOUNTER — Emergency Department (HOSPITAL_COMMUNITY)
Admission: EM | Admit: 2017-08-30 | Discharge: 2017-08-30 | Disposition: A | Discharge status: Home / Self Care | Payer: Medicaid Other | Attending: Emergency Medicine | Admitting: Emergency Medicine

## 2017-08-30 ENCOUNTER — Other Ambulatory Visit: Payer: Self-pay

## 2017-08-30 ENCOUNTER — Encounter (HOSPITAL_COMMUNITY): Payer: Self-pay | Admitting: Emergency Medicine

## 2017-08-30 ENCOUNTER — Emergency Department (HOSPITAL_COMMUNITY): Payer: Medicaid Other

## 2017-08-30 DIAGNOSIS — F17200 Nicotine dependence, unspecified, uncomplicated: Secondary | ICD-10-CM | POA: Insufficient documentation

## 2017-08-30 DIAGNOSIS — R0789 Other chest pain: Secondary | ICD-10-CM | POA: Diagnosis not present

## 2017-08-30 DIAGNOSIS — Z79899 Other long term (current) drug therapy: Secondary | ICD-10-CM | POA: Diagnosis not present

## 2017-08-30 DIAGNOSIS — F419 Anxiety disorder, unspecified: Secondary | ICD-10-CM | POA: Diagnosis not present

## 2017-08-30 DIAGNOSIS — I10 Essential (primary) hypertension: Secondary | ICD-10-CM | POA: Diagnosis not present

## 2017-08-30 DIAGNOSIS — R05 Cough: Secondary | ICD-10-CM | POA: Diagnosis not present

## 2017-08-30 LAB — CBC
HEMATOCRIT: 38.6 % (ref 36.0–46.0)
HEMOGLOBIN: 12.6 g/dL (ref 12.0–15.0)
MCH: 30.6 pg (ref 26.0–34.0)
MCHC: 32.6 g/dL (ref 30.0–36.0)
MCV: 93.7 fL (ref 78.0–100.0)
Platelets: 205 10*3/uL (ref 150–400)
RBC: 4.12 MIL/uL (ref 3.87–5.11)
RDW: 14.9 % (ref 11.5–15.5)
WBC: 4.7 10*3/uL (ref 4.0–10.5)

## 2017-08-30 LAB — BASIC METABOLIC PANEL
Anion gap: 11 (ref 5–15)
BUN: 5 mg/dL — AB (ref 6–20)
CALCIUM: 9 mg/dL (ref 8.9–10.3)
CHLORIDE: 95 mmol/L — AB (ref 101–111)
CO2: 26 mmol/L (ref 22–32)
Creatinine, Ser: 0.74 mg/dL (ref 0.44–1.00)
Glucose, Bld: 118 mg/dL — ABNORMAL HIGH (ref 65–99)
Potassium: 3.7 mmol/L (ref 3.5–5.1)
Sodium: 132 mmol/L — ABNORMAL LOW (ref 135–145)

## 2017-08-30 LAB — TROPONIN I

## 2017-08-30 LAB — D-DIMER, QUANTITATIVE (NOT AT ARMC)

## 2017-08-30 LAB — HCG, QUANTITATIVE, PREGNANCY: hCG, Beta Chain, Quant, S: <1 m[IU]/mL (ref <5)

## 2017-08-30 MED ORDER — ALPRAZOLAM 0.5 MG PO TABS
0.2500 mg | ORAL_TABLET | Freq: Once | ORAL | Status: AC
  Administered 2017-08-30: 0.25 mg via ORAL
  Filled 2017-08-30: qty 1

## 2017-08-30 MED ORDER — ALPRAZOLAM 0.25 MG PO TABS: 0.2500 mg | ORAL_TABLET | Freq: Three times a day (TID) | ORAL | 0 refills | Status: DC | PRN

## 2017-08-30 MED ORDER — IBUPROFEN 600 MG PO TABS: 600.0000 mg | ORAL_TABLET | Freq: Four times a day (QID) | ORAL | 0 refills | Status: DC | PRN

## 2017-08-30 MED ORDER — IBUPROFEN 400 MG PO TABS
600.0000 mg | ORAL_TABLET | Freq: Once | ORAL | Status: AC
  Administered 2017-08-30: 600 mg via ORAL
  Filled 2017-08-30: qty 2

## 2017-08-30 NOTE — ED Triage Notes (Signed)
Patient complaining of cough starting 2 hours prior to arrival. Also complaining of chest pain radiating down left arm starting on the way to the ER.

## 2017-08-30 NOTE — ED Provider Notes (Signed)
[facility] EMERGENCY DEPARTMENT Provider Note   CSN: [id number] Arrival date & time: 08/30/17  1153     History   Chief Complaint Chief Complaint  Patient presents with  . Cough    HPI Hannah Rhodes is a 37 y.o. female.  HPI She with acute onset of central chest pain and nonproductive coughing that started at 1050 this morning.  Pain is worse with sitting forward.  Patient has difficulty characterizing pain.  She had no fever or chills.  Denies any shortness of breath.  Has had increased anxiety which may be contributing to her symptoms.  No recent extended travel or immobilization.  No lower extremity swelling or pain. Past Medical History:  Diagnosis Date  . Anxiety   . Generalized headaches   . HPV (human papilloma virus) infection   . Hypertension   . Panic attack   . Tachycardia     Patient Active Problem List   Diagnosis Date Noted  . DYSPNEA 10/24/2009  . ANXIETY STATE, UNSPECIFIED 10/21/2009  . NONDEPENDENT TOBACCO USE DISORDER 10/21/2009  . INSOMNIA UNSPECIFIED 10/21/2009  . PALPITATIONS 10/21/2009    Past Surgical History:  Procedure Laterality Date  . CESAREAN SECTION    . CESAREAN SECTION    . SMALL INTESTINE SURGERY       OB History   None      Home Medications    Prior to Admission medications   Medication Sig Start Date End Date Taking? Authorizing Provider  amLODipine (NORVASC) 5 MG tablet Take 1 tablet (5 mg total) by mouth daily. 05/14/17 08/31/18 Yes Laqueta Linden, MD  buPROPion (WELLBUTRIN XL) 300 MG 24 hr tablet Take 300 mg by mouth daily.   Yes [provider]  metoprolol tartrate (LOPRESSOR) 50 MG tablet Take 1 tablet (50 mg total) by mouth 2 (two) times daily. 12/27/16 08/31/18 Yes Laqueta Linden, MD  ALPRAZolam Prudy Feeler) 0.25 MG tablet Take 1 tablet (0.25 mg total) by mouth 3 (three) times daily as needed for anxiety. 08/30/17   Loren Racer, MD  ibuprofen (ADVIL,MOTRIN) 600 MG tablet Take 1 tablet (600 mg total)  by mouth every 6 (six) hours as needed. 08/30/17   Loren Racer, MD    Family History Family History  Problem Relation Age of Onset  . Anxiety disorder Sister   . Restless legs syndrome Sister   . Heart failure Mother   . CAD Mother   . Heart attack Mother     Social History Social History   Tobacco Use  . Smoking status: Current Every Day Smoker    Packs/day: 0.50  . Smokeless tobacco: Never Used  Substance Use Topics  . Alcohol use: Yes    Comment: occ  . Drug use: Not Currently    Types: Marijuana    Comment: last use 4 months ago     Allergies   Patient has no known allergies.   Review of Systems Review of Systems  Constitutional: Negative for chills and fever.  HENT: Negative for congestion and trouble swallowing.   Respiratory: Positive for cough. Negative for shortness of breath and wheezing.   Cardiovascular: Positive for chest pain. Negative for palpitations and leg swelling.  Gastrointestinal: Negative for abdominal pain, diarrhea, nausea and vomiting.  Genitourinary: Negative for flank pain, frequency and hematuria.  Musculoskeletal: Negative for back pain, neck pain and neck stiffness.  Skin: Negative for rash.  Neurological: Negative for dizziness, weakness, light-headedness, numbness and headaches.  All other systems reviewed and are negative.  Physical Exam Updated Vital Signs BP 114/85   Pulse 73   Temp (!) 97.2 F (36.2 C) (Oral)   Resp (!) 22   Ht 4\' 10"  (1.473 m)   Wt 42.2 kg (93 lb)   LMP 05/30/2017   SpO2 100%   BMI 19.44 kg/m   Physical Exam  Constitutional: She is oriented to person, place, and time. She appears well-developed and well-nourished. No distress.  HENT:  Head: Normocephalic and atraumatic.  Mouth/Throat: Oropharynx is clear and moist.  Eyes: Pupils are equal, round, and reactive to light. EOM are normal.  Neck: Normal range of motion. Neck supple. No JVD present.  Cardiovascular: Normal rate and regular  rhythm. Exam reveals no gallop and no friction rub.  No murmur heard. Pulmonary/Chest: Effort normal and breath sounds normal. No stridor. No respiratory distress. She has no wheezes. She has no rales. She exhibits no tenderness.  Abdominal: Soft. Bowel sounds are normal. There is no tenderness. There is no rebound and no guarding.  Musculoskeletal: Normal range of motion. She exhibits no edema or tenderness.  No midline thoracic or lumbar tenderness.  No lower extremity swelling, asymmetry tender or tenderness.  Distal pulses are 2+.  Lymphadenopathy:    She has no cervical adenopathy.  Neurological: She is alert and oriented to person, place, and time.  Moving all extremities without focal deficit.  Sensation fully intact.  Skin: Skin is warm and dry. No rash noted. She is not diaphoretic. No erythema.  Psychiatric: She has a normal mood and affect. Her behavior is normal.  Nursing note and vitals reviewed.    ED Treatments / Results  Labs (all labs ordered are listed, but only abnormal results are displayed) Labs Reviewed  BASIC METABOLIC PANEL - Abnormal; Notable for the following components:      Result Value   Sodium 132 (*)    Chloride 95 (*)    Glucose, Bld 118 (*)    BUN 5 (*)    All other components within normal limits  CBC  TROPONIN I  HCG, QUANTITATIVE, PREGNANCY  D-DIMER, QUANTITATIVE (NOT AT [facility])    EKG None  Radiology Dg Chest 2 View  Result Date: 08/30/2017 CLINICAL DATA:  Nonproductive cough and chest pain. EXAM: CHEST - 2 VIEW COMPARISON:  Chest x-ray dated May 09, 2017. FINDINGS: The heart size and mediastinal contours are within normal limits. Both lungs are clear. The visualized skeletal structures are unremarkable. IMPRESSION: No active cardiopulmonary disease. Electronically Signed   By: Obie DredgeWilliam T Derry M.D.   On: 08/30/2017 13:01    Procedures Procedures (including critical care time)  Medications Ordered in ED Medications  ibuprofen  (ADVIL,MOTRIN) tablet 600 mg (600 mg Oral Given 08/30/17 1554)  ALPRAZolam (XANAX) tablet 0.25 mg (0.25 mg Oral Given 08/30/17 1554)     Initial Impression / Assessment and Plan / ED Course  I have reviewed the triage vital signs and the nursing notes.  Pertinent labs & imaging results that were available during my care of the patient were reviewed by me and considered in my medical decision making (see chart for details).     Chest pain is atypical for coronary artery disease.  EKG without ischemic findings.  Normal troponin and d-dimer.  Suspect symptoms may be chest wall in etiology.  Patient's been evaluated by cardiology in the past and had stress test without concerning findings.  We will treat symptomatically. Strict return precautions have been given. Final Clinical Impressions(s) / ED Diagnoses   Final  diagnoses:  Atypical chest pain    ED Discharge Orders        Ordered    ibuprofen (ADVIL,MOTRIN) 600 MG tablet  Every 6 hours PRN     08/30/17 1624    ALPRAZolam (XANAX) 0.25 MG tablet  3 times daily PRN     08/30/17 1624       Loren Racer, MD 08/30/17 1625

## 2018-01-16 ENCOUNTER — Ambulatory Visit (HOSPITAL_COMMUNITY): Payer: Self-pay | Admitting: Licensed Clinical Social Worker

## 2018-01-16 ENCOUNTER — Emergency Department (HOSPITAL_COMMUNITY)
Admission: EM | Admit: 2018-01-16 | Discharge: 2018-01-16 | Disposition: A | Discharge status: Home / Self Care | Payer: Medicaid Other | Attending: Emergency Medicine | Admitting: Emergency Medicine

## 2018-01-16 ENCOUNTER — Emergency Department (HOSPITAL_COMMUNITY): Payer: Medicaid Other

## 2018-01-16 ENCOUNTER — Other Ambulatory Visit: Payer: Self-pay

## 2018-01-16 ENCOUNTER — Encounter (HOSPITAL_COMMUNITY): Payer: Self-pay

## 2018-01-16 DIAGNOSIS — R945 Abnormal results of liver function studies: Secondary | ICD-10-CM | POA: Diagnosis not present

## 2018-01-16 DIAGNOSIS — I10 Essential (primary) hypertension: Secondary | ICD-10-CM | POA: Diagnosis not present

## 2018-01-16 DIAGNOSIS — R1084 Generalized abdominal pain: Secondary | ICD-10-CM | POA: Diagnosis present

## 2018-01-16 DIAGNOSIS — F17200 Nicotine dependence, unspecified, uncomplicated: Secondary | ICD-10-CM | POA: Insufficient documentation

## 2018-01-16 DIAGNOSIS — R748 Abnormal levels of other serum enzymes: Secondary | ICD-10-CM

## 2018-01-16 DIAGNOSIS — R000 Tachycardia, unspecified: Secondary | ICD-10-CM | POA: Diagnosis not present

## 2018-01-16 DIAGNOSIS — Z79899 Other long term (current) drug therapy: Secondary | ICD-10-CM | POA: Insufficient documentation

## 2018-01-16 LAB — URINALYSIS, ROUTINE W REFLEX MICROSCOPIC
BILIRUBIN URINE: NEGATIVE
Glucose, UA: NEGATIVE mg/dL
HGB URINE DIPSTICK: NEGATIVE
Ketones, ur: NEGATIVE mg/dL
Leukocytes, UA: NEGATIVE
Nitrite: NEGATIVE
Protein, ur: NEGATIVE mg/dL
SPECIFIC GRAVITY, URINE: 1.001 — AB (ref 1.005–1.030)
pH: 6 (ref 5.0–8.0)

## 2018-01-16 LAB — COMPREHENSIVE METABOLIC PANEL
ALT: 444 U/L — AB (ref 0–44)
AST: 821 U/L — ABNORMAL HIGH (ref 15–41)
Albumin: 3.8 g/dL (ref 3.5–5.0)
Alkaline Phosphatase: 210 U/L — ABNORMAL HIGH (ref 38–126)
Anion gap: 9 (ref 5–15)
BUN: 8 mg/dL (ref 6–20)
CALCIUM: 9.6 mg/dL (ref 8.9–10.3)
CO2: 24 mmol/L (ref 22–32)
CREATININE: 0.64 mg/dL (ref 0.44–1.00)
Chloride: 103 mmol/L (ref 98–111)
Glucose, Bld: 117 mg/dL — ABNORMAL HIGH (ref 70–99)
Potassium: 3.6 mmol/L (ref 3.5–5.1)
Sodium: 136 mmol/L (ref 135–145)
Total Bilirubin: 1.2 mg/dL (ref 0.3–1.2)
Total Protein: 7.7 g/dL (ref 6.5–8.1)

## 2018-01-16 LAB — CBC WITH DIFFERENTIAL/PLATELET
Abs Immature Granulocytes: 0.04 10*3/uL (ref 0.00–0.07)
BASOS ABS: 0 10*3/uL (ref 0.0–0.1)
BASOS PCT: 0 %
EOS ABS: 0 10*3/uL (ref 0.0–0.5)
EOS PCT: 0 %
HCT: 42.1 % (ref 36.0–46.0)
Hemoglobin: 13.8 g/dL (ref 12.0–15.0)
Immature Granulocytes: 1 %
Lymphocytes Relative: 28 %
Lymphs Abs: 2.1 10*3/uL (ref 0.7–4.0)
MCH: 31.8 pg (ref 26.0–34.0)
MCHC: 32.8 g/dL (ref 30.0–36.0)
MCV: 97 fL (ref 80.0–100.0)
Monocytes Absolute: 0.7 10*3/uL (ref 0.1–1.0)
Monocytes Relative: 9 %
NRBC: 0 % (ref 0.0–0.2)
Neutro Abs: 4.5 10*3/uL (ref 1.7–7.7)
Neutrophils Relative %: 62 %
Platelets: 210 10*3/uL (ref 150–400)
RBC: 4.34 MIL/uL (ref 3.87–5.11)
RDW: 15.3 % (ref 11.5–15.5)
WBC: 7.4 10*3/uL (ref 4.0–10.5)

## 2018-01-16 LAB — RAPID URINE DRUG SCREEN, HOSP PERFORMED
Amphetamines: NOT DETECTED
Barbiturates: NOT DETECTED
Benzodiazepines: NOT DETECTED
Cocaine: NOT DETECTED
OPIATES: NOT DETECTED
TETRAHYDROCANNABINOL: NOT DETECTED

## 2018-01-16 LAB — PREGNANCY, URINE: PREG TEST UR: NEGATIVE

## 2018-01-16 MED ORDER — METOPROLOL TARTRATE 25 MG PO TABS
25.0000 mg | ORAL_TABLET | Freq: Once | ORAL | Status: AC
  Administered 2018-01-16: 25 mg via ORAL
  Filled 2018-01-16: qty 1

## 2018-01-16 MED ORDER — SODIUM CHLORIDE 0.9 % IV BOLUS
1000.0000 mL | Freq: Once | INTRAVENOUS | Status: AC
  Administered 2018-01-16: 1000 mL via INTRAVENOUS

## 2018-01-16 MED ORDER — KETOROLAC TROMETHAMINE 30 MG/ML IJ SOLN
30.0000 mg | Freq: Once | INTRAMUSCULAR | Status: AC
  Administered 2018-01-16: 30 mg via INTRAVENOUS
  Filled 2018-01-16: qty 1

## 2018-01-16 MED ORDER — IOPAMIDOL (ISOVUE-300) INJECTION 61%
100.0000 mL | Freq: Once | INTRAVENOUS | Status: AC | PRN
  Administered 2018-01-16: 100 mL via INTRAVENOUS

## 2018-01-16 NOTE — Discharge Instructions (Signed)
Take your medications as prescribed. Follow-up with your primary care provider early next week.  Return to the emergency room for worsening or concerning symptoms. Additional lab work was sent out today to evaluate for hepatitis (infection of the liver).  He should discuss this at follow-up with your primary care provider. Avoid alcohol and medications containing Tylenol until recheck with your primary care provider and repeat of your liver tests.  Take ibuprofen or Aleve as needed as directed for pain.

## 2018-01-16 NOTE — ED Triage Notes (Signed)
Pt c/o upper abd pain radiating around to her back x 3 days.  Denies any n/v/d.

## 2018-01-16 NOTE — ED Provider Notes (Signed)
[facility] EMERGENCY DEPARTMENT Provider Note   CSN: [id number] Arrival date & time: 01/16/18  4098     History   Chief Complaint Chief Complaint  Patient presents with  . Abdominal Pain    HPI Hannah Rhodes is a 37 y.o. female.  37 year old female with history of HTN, tachycardia, daily smoker, presents with complaint of abdominal pain x3 days.  Patient states that she woke that morning with pain in her left and right low back areas which radiates around to her diffuse abdominal area.  Pain is constant, progressively worsening, aching in nature.  It is worse with movement and taking deep breaths, taking ibuprofen with minimal relief.  Denies pain with eating or bending.  Denies nausea, vomiting, changes in bowel or bladder habits, vaginal discharge, fevers, chills.  Previous abdominal surgeries include surgery involving the small intestine and 2 C-sections.  No other complaints or concerns.     Past Medical History:  Diagnosis Date  . Anxiety   . Generalized headaches   . HPV (human papilloma virus) infection   . Hypertension   . Panic attack   . Tachycardia     Patient Active Problem List   Diagnosis Date Noted  . DYSPNEA 10/24/2009  . ANXIETY STATE, UNSPECIFIED 10/21/2009  . NONDEPENDENT TOBACCO USE DISORDER 10/21/2009  . INSOMNIA UNSPECIFIED 10/21/2009  . PALPITATIONS 10/21/2009    Past Surgical History:  Procedure Laterality Date  . CESAREAN SECTION    . CESAREAN SECTION    . SMALL INTESTINE SURGERY       OB History   None      Home Medications    Prior to Admission medications   Medication Sig Start Date End Date Taking? Authorizing Provider  ALPRAZolam (XANAX) 0.25 MG tablet Take 1 tablet (0.25 mg total) by mouth 3 (three) times daily as needed for anxiety. 08/30/17   Loren Racer, MD  amLODipine (NORVASC) 5 MG tablet Take 1 tablet (5 mg total) by mouth daily. 05/14/17 08/31/18  Laqueta Linden, MD  buPROPion (WELLBUTRIN XL) 300 MG 24 hr  tablet Take 300 mg by mouth daily.    [provider]  ibuprofen (ADVIL,MOTRIN) 600 MG tablet Take 1 tablet (600 mg total) by mouth every 6 (six) hours as needed. 08/30/17   Loren Racer, MD  metoprolol tartrate (LOPRESSOR) 50 MG tablet Take 1 tablet (50 mg total) by mouth 2 (two) times daily. 12/27/16 08/31/18  Laqueta Linden, MD    Family History Family History  Problem Relation Age of Onset  . Anxiety disorder Sister   . Restless legs syndrome Sister   . Heart failure Mother   . CAD Mother   . Heart attack Mother     Social History Social History   Tobacco Use  . Smoking status: Current Every Day Smoker    Packs/day: 0.50  . Smokeless tobacco: Never Used  Substance Use Topics  . Alcohol use: Yes    Comment: occ  . Drug use: Not Currently    Types: Marijuana    Comment: last use 4 months ago     Allergies   Patient has no known allergies.   Review of Systems Review of Systems  Constitutional: Negative for chills and fever.  Respiratory: Negative for cough, chest tightness and shortness of breath.   Cardiovascular: Negative for chest pain.  Gastrointestinal: Positive for abdominal pain. Negative for blood in stool, constipation, diarrhea, nausea and vomiting.  Genitourinary: Negative for difficulty urinating, dysuria, frequency and hematuria.  Musculoskeletal:  Positive for back pain. Negative for arthralgias.  Skin: Negative for rash and wound.  Allergic/Immunologic: Negative for immunocompromised state.  Neurological: Negative for weakness.  Hematological: Negative for adenopathy. Does not bruise/bleed easily.  Psychiatric/Behavioral: Negative for confusion.  All other systems reviewed and are negative.    Physical Exam Updated Vital Signs BP (!) 148/101 (BP Location: Left Arm)   Pulse 98   Temp 98.8 F (37.1 C) (Oral)   Resp 16   Ht 4\' 10"  (1.473 m)   Wt 45.8 kg   SpO2 98%   BMI 21.11 kg/m   Physical Exam  Constitutional: She is  oriented to person, place, and time. She appears well-developed and well-nourished. No distress.  HENT:  Head: Normocephalic and atraumatic.  edentulous   Cardiovascular: Regular rhythm, normal heart sounds and intact distal pulses. Tachycardia present.  No murmur heard. Pulmonary/Chest: Effort normal and breath sounds normal.  Abdominal: Bowel sounds are normal. There is generalized tenderness. There is no CVA tenderness.  Musculoskeletal:       Lumbar back: She exhibits no bony tenderness.       Back:  Neurological: She is alert and oriented to person, place, and time.  Skin: Skin is warm and dry. She is not diaphoretic.  Psychiatric: She has a normal mood and affect. Her behavior is normal.  Nursing note and vitals reviewed.    ED Treatments / Results  Labs (all labs ordered are listed, but only abnormal results are displayed) Labs Reviewed  COMPREHENSIVE METABOLIC PANEL - Abnormal; Notable for the following components:      Result Value   Glucose, Bld 117 (*)    AST 821 (*)    ALT 444 (*)    Alkaline Phosphatase 210 (*)    All other components within normal limits  URINALYSIS, ROUTINE W REFLEX MICROSCOPIC - Abnormal; Notable for the following components:   Color, Urine STRAW (*)    Specific Gravity, Urine 1.001 (*)    All other components within normal limits  CBC WITH DIFFERENTIAL/PLATELET  PREGNANCY, URINE  RAPID URINE DRUG SCREEN, HOSP PERFORMED  HEPATITIS PANEL, ACUTE    EKG None  Radiology Ct Abdomen Pelvis W Contrast  Result Date: 01/16/2018 CLINICAL DATA:  Acute generalized abdominal pain. EXAM: CT ABDOMEN AND PELVIS WITH CONTRAST TECHNIQUE: Multidetector CT imaging of the abdomen and pelvis was performed using the standard protocol following bolus administration of intravenous contrast. CONTRAST:  ISOVUE-300 IOPAMIDOL (ISOVUE-300) INJECTION 61% COMPARISON:  CT scan of March 20, 2017. FINDINGS: Lower chest: No acute abnormality. Hepatobiliary: No focal  liver abnormality is seen. No gallstones, gallbladder wall thickening, or biliary dilatation. Pancreas: Unremarkable. No pancreatic ductal dilatation or surrounding inflammatory changes. Spleen: Normal in size without focal abnormality. Adrenals/Urinary Tract: Adrenal glands are unremarkable. Kidneys are normal, without renal calculi, focal lesion, or hydronephrosis. Bladder is unremarkable. Stomach/Bowel: Stomach is within normal limits. Appendix appears normal. No evidence of bowel wall thickening, distention, or inflammatory changes. Vascular/Lymphatic: Aortic atherosclerosis. No enlarged abdominal or pelvic lymph nodes. Reproductive: Uterus and right adnexal regions are unremarkable. Probable involuting left ovarian cyst is noted. Other: No abdominal wall hernia or abnormality. No abdominopelvic ascites. Musculoskeletal: No acute or significant osseous findings. IMPRESSION: Probable involuting left ovarian cyst. No other abnormality seen in the abdomen or pelvis. Aortic Atherosclerosis (ICD10-I70.0). Electronically Signed   By: Lupita Raider, M.D.   On: 01/16/2018 11:36    Procedures Procedures (including critical care time)  Medications Ordered in ED Medications  sodium chloride 0.9 %  bolus 1,000 mL (0 mLs Intravenous Stopped 01/16/18 1030)  iopamidol (ISOVUE-300) 61 % injection 100 mL (100 mLs Intravenous Contrast Given 01/16/18 1116)  metoprolol tartrate (LOPRESSOR) tablet 25 mg (25 mg Oral Given 01/16/18 1211)  ketorolac (TORADOL) 30 MG/ML injection 30 mg (30 mg Intravenous Given 01/16/18 1211)     Initial Impression / Assessment and Plan / ED Course  I have reviewed the triage vital signs and the nursing notes.  Pertinent labs & imaging results that were available during my care of the patient were reviewed by me and considered in my medical decision making (see chart for details).  Clinical Course as of Jan 17 1623  Thu Jan 16, 2018  3233 37 year old female with complaint of pain  from her low back around to her abdominal area progressively worsening, no associated symptoms.  On exam patient has left and right paraspinous tenderness with mild generalized abdominal tenderness.  CT abdomen pelvis is unremarkable.  Urine pregnancy test negative, CMP with elevated liver enzymes, urine drug screen, CBC, urinalysis unremarkable.  Add on acute hepatitis panel, discussing with patient.  Recommend follow-up with PCP this week and avoid alcohol and Tylenol-containing drugs.  Patient was hypertensive tachycardic on arrival, history of same, takes metoprolol for rate control and blood pressure management and states she did not take her medication today.  Patient was given 1 dose of metoprolol while in the emergency room with improvement in her heart rate.  Advised to continue with her regular medications and follow-up with PCP, return to ER for worsening or concerning symptoms.   [LM]    Clinical Course User Index [LM] Jeannie Fend, PA-C   Final Clinical Impressions(s) / ED Diagnoses   Final diagnoses:  Elevated liver enzymes  Generalized abdominal pain  Tachycardia    ED Discharge Orders    None       Jeannie Fend, PA-C 01/16/18 1624    Loren Racer, MD 01/17/18 1515

## 2018-01-17 LAB — HEPATITIS PANEL, ACUTE
HCV Ab: >11 s/co ratio — ABNORMAL HIGH (ref 0.0–0.9)
HEP B C IGM: NEGATIVE
Hep A IgM: NEGATIVE
Hepatitis B Surface Ag: NEGATIVE

## 2018-02-09 ENCOUNTER — Emergency Department (HOSPITAL_COMMUNITY)
Admission: EM | Admit: 2018-02-09 | Discharge: 2018-02-10 | Disposition: A | Discharge status: Home / Self Care | Payer: Medicaid Other | Attending: Emergency Medicine | Admitting: Emergency Medicine

## 2018-02-09 ENCOUNTER — Other Ambulatory Visit: Payer: Self-pay

## 2018-02-09 ENCOUNTER — Emergency Department (HOSPITAL_COMMUNITY): Payer: Medicaid Other

## 2018-02-09 ENCOUNTER — Encounter (HOSPITAL_COMMUNITY): Payer: Self-pay | Admitting: Emergency Medicine

## 2018-02-09 DIAGNOSIS — I10 Essential (primary) hypertension: Secondary | ICD-10-CM | POA: Insufficient documentation

## 2018-02-09 DIAGNOSIS — R112 Nausea with vomiting, unspecified: Secondary | ICD-10-CM | POA: Diagnosis present

## 2018-02-09 DIAGNOSIS — Z79899 Other long term (current) drug therapy: Secondary | ICD-10-CM | POA: Diagnosis not present

## 2018-02-09 DIAGNOSIS — K529 Noninfective gastroenteritis and colitis, unspecified: Secondary | ICD-10-CM

## 2018-02-09 DIAGNOSIS — F17200 Nicotine dependence, unspecified, uncomplicated: Secondary | ICD-10-CM | POA: Insufficient documentation

## 2018-02-09 DIAGNOSIS — R079 Chest pain, unspecified: Secondary | ICD-10-CM | POA: Diagnosis not present

## 2018-02-09 LAB — BASIC METABOLIC PANEL
Anion gap: 13 (ref 5–15)
BUN: 5 mg/dL — ABNORMAL LOW (ref 6–20)
CALCIUM: 9 mg/dL (ref 8.9–10.3)
CO2: 22 mmol/L (ref 22–32)
CREATININE: 0.63 mg/dL (ref 0.44–1.00)
Chloride: 102 mmol/L (ref 98–111)
GFR calc Af Amer: >60 mL/min (ref >60)
GLUCOSE: 84 mg/dL (ref 70–99)
Potassium: 3.9 mmol/L (ref 3.5–5.1)
Sodium: 137 mmol/L (ref 135–145)

## 2018-02-09 LAB — CBC
HCT: 39.3 % (ref 36.0–46.0)
HEMOGLOBIN: 13.2 g/dL (ref 12.0–15.0)
MCH: 31.9 pg (ref 26.0–34.0)
MCHC: 33.6 g/dL (ref 30.0–36.0)
MCV: 94.9 fL (ref 80.0–100.0)
PLATELETS: 261 10*3/uL (ref 150–400)
RBC: 4.14 MIL/uL (ref 3.87–5.11)
RDW: 15.1 % (ref 11.5–15.5)
WBC: 6.5 10*3/uL (ref 4.0–10.5)
nRBC: 0 % (ref 0.0–0.2)

## 2018-02-09 LAB — I-STAT BETA HCG BLOOD, ED (NOT ORDERABLE)

## 2018-02-09 LAB — INFLUENZA PANEL BY PCR (TYPE A & B)
INFLAPCR: NEGATIVE
Influenza B By PCR: NEGATIVE

## 2018-02-09 LAB — POCT I-STAT TROPONIN I: Troponin i, poc: 0 ng/mL (ref 0.00–0.08)

## 2018-02-09 MED ORDER — ONDANSETRON HCL 4 MG/2ML IJ SOLN
4.0000 mg | Freq: Once | INTRAMUSCULAR | Status: AC
  Administered 2018-02-09: 4 mg via INTRAVENOUS
  Filled 2018-02-09: qty 2

## 2018-02-09 MED ORDER — SODIUM CHLORIDE 0.9 % IV BOLUS
1000.0000 mL | Freq: Once | INTRAVENOUS | Status: AC
  Administered 2018-02-09: 1000 mL via INTRAVENOUS

## 2018-02-09 MED ORDER — KETOROLAC TROMETHAMINE 30 MG/ML IJ SOLN
15.0000 mg | Freq: Once | INTRAMUSCULAR | Status: AC
  Administered 2018-02-09: 15 mg via INTRAVENOUS
  Filled 2018-02-09: qty 1

## 2018-02-09 NOTE — ED Triage Notes (Addendum)
Pt with c/o flu-like symptoms with chest pain, dizziness,and shortness of breath. Pt states both her kids are sick. States one has the Flu and the other has PNA.

## 2018-02-10 LAB — HEPATIC FUNCTION PANEL
ALT: 30 U/L (ref 0–44)
AST: 72 U/L — ABNORMAL HIGH (ref 15–41)
Albumin: 4.1 g/dL (ref 3.5–5.0)
Alkaline Phosphatase: 132 U/L — ABNORMAL HIGH (ref 38–126)
BILIRUBIN TOTAL: 1.4 mg/dL — AB (ref 0.3–1.2)
Bilirubin, Direct: 0.4 mg/dL — ABNORMAL HIGH (ref 0.0–0.2)
Indirect Bilirubin: 1 mg/dL — ABNORMAL HIGH (ref 0.3–0.9)
Total Protein: 8.3 g/dL — ABNORMAL HIGH (ref 6.5–8.1)

## 2018-02-10 MED ORDER — PROMETHAZINE HCL 25 MG/ML IJ SOLN
12.5000 mg | Freq: Once | INTRAMUSCULAR | Status: AC
  Administered 2018-02-10: 12.5 mg via INTRAVENOUS
  Filled 2018-02-10: qty 1

## 2018-02-10 MED ORDER — ACETAMINOPHEN 325 MG PO TABS
650.0000 mg | ORAL_TABLET | Freq: Once | ORAL | Status: AC
  Administered 2018-02-10: 650 mg via ORAL
  Filled 2018-02-10: qty 2

## 2018-02-10 MED ORDER — PROMETHAZINE HCL 25 MG PO TABS: 25.0000 mg | ORAL_TABLET | Freq: Four times a day (QID) | ORAL | 0 refills | Status: DC | PRN

## 2018-02-10 MED ORDER — DIPHENOXYLATE-ATROPINE 2.5-0.025 MG PO TABS: 1.0000 | ORAL_TABLET | Freq: Four times a day (QID) | ORAL | 0 refills | Status: DC | PRN

## 2018-02-10 NOTE — ED Provider Notes (Signed)
[facility] EMERGENCY DEPARTMENT Provider Note   CSN: [id number] Arrival date & time: 02/09/18  2139     History   Chief Complaint Chief Complaint  Patient presents with  . Flu like symptoms    HPI Hannah Rhodes is a 37 y.o. female with a history of htn, anxiety and panic and tachycardia presenting with a nausea, vomiting, diarrhea and chest pain with lightheadedness and shortness of breath which started this morning upon waking.  She describes 4-5 episodes of loose to watery diarrhea with an equal numbness of episodes of vomiting in association with cramping abdominal pain which improves after bowel movements. She has 2 children at home currently with similar symptoms.  She also reports a nonproductive cough and chest pain, described as a constant burning or tingling sensation midsternal and  worsened with coughing.  Fevers have been subjective. She feels weak and lightheaded, suspects she may be dehydrated.  No throat pain, headache, ear or sinus pain, no nasal congestion. She has had no medicines or treatments prior to arrival and has found no alleviators.  Of note, pt had elevated liver enzymes at a visit here on 01/16/18.  She has not seen her pcp in follow up for this.  Her symptoms from that visit have improved.  Per the chart her hepatitis panel was positive for Hep C antibody.  HPI  Past Medical History:  Diagnosis Date  . Anxiety   . Generalized headaches   . HPV (human papilloma virus) infection   . Hypertension   . Panic attack   . Tachycardia     Patient Active Problem List   Diagnosis Date Noted  . DYSPNEA 10/24/2009  . ANXIETY STATE, UNSPECIFIED 10/21/2009  . NONDEPENDENT TOBACCO USE DISORDER 10/21/2009  . INSOMNIA UNSPECIFIED 10/21/2009  . PALPITATIONS 10/21/2009    Past Surgical History:  Procedure Laterality Date  . CESAREAN SECTION    . CESAREAN SECTION    . SMALL INTESTINE SURGERY       OB History   None      Home Medications    Prior to  Admission medications   Medication Sig Start Date End Date Taking? Authorizing Provider  amLODipine (NORVASC) 5 MG tablet Take 1 tablet (5 mg total) by mouth daily. 05/14/17 08/31/18 Yes Laqueta Linden, MD  metoprolol tartrate (LOPRESSOR) 50 MG tablet Take 1 tablet (50 mg total) by mouth 2 (two) times daily. 12/27/16 08/31/18 Yes Laqueta Linden, MD  ALPRAZolam Prudy Feeler) 0.25 MG tablet Take 1 tablet (0.25 mg total) by mouth 3 (three) times daily as needed for anxiety. Patient not taking: Reported on 02/09/2018 08/30/17   Loren Racer, MD  diphenoxylate-atropine (LOMOTIL) 2.5-0.025 MG tablet Take 1 tablet by mouth 4 (four) times daily as needed for diarrhea or loose stools. 02/10/18   Burgess Amor, PA-C  promethazine (PHENERGAN) 25 MG tablet Take 1 tablet (25 mg total) by mouth every 6 (six) hours as needed for nausea or vomiting. 02/10/18   Burgess Amor, PA-C    Family History Family History  Problem Relation Age of Onset  . Anxiety disorder Sister   . Restless legs syndrome Sister   . Heart failure Mother   . CAD Mother   . Heart attack Mother     Social History Social History   Tobacco Use  . Smoking status: Current Every Day Smoker    Packs/day: 0.50  . Smokeless tobacco: Never Used  Substance Use Topics  . Alcohol use: Yes    Comment: occ  .  Drug use: Not Currently    Types: Marijuana    Comment: last use 4 months ago     Allergies   Patient has no known allergies.   Review of Systems Review of Systems  Constitutional: Positive for fever.  HENT: Negative for congestion and sore throat.   Eyes: Negative.   Respiratory: Positive for cough. Negative for chest tightness and shortness of breath.   Cardiovascular: Positive for chest pain.  Gastrointestinal: Positive for diarrhea, nausea and vomiting. Negative for abdominal pain.  Genitourinary: Negative.  Negative for decreased urine volume and dysuria.  Musculoskeletal: Positive for myalgias. Negative for  arthralgias, joint swelling and neck pain.  Skin: Negative.  Negative for rash and wound.  Neurological: Positive for light-headedness. Negative for dizziness, weakness, numbness and headaches.  Psychiatric/Behavioral: Negative.      Physical Exam Updated Vital Signs BP (!) 164/97 (BP Location: Right Arm)   Pulse 94   Temp 98.2 F (36.8 C) (Oral)   Resp 17   Ht 4\' 10"  (1.473 m)   Wt 44 kg   SpO2 97%   BMI 20.27 kg/m   Physical Exam  Constitutional: She appears well-developed and well-nourished.  HENT:  Head: Normocephalic and atraumatic.  Eyes: Conjunctivae are normal.  Neck: Normal range of motion.  Cardiovascular: Normal rate, regular rhythm, normal heart sounds and intact distal pulses.  Pulmonary/Chest: Effort normal and breath sounds normal. No respiratory distress. She has no wheezes. She has no rales. She exhibits no tenderness.  Abdominal: Soft. Bowel sounds are normal. She exhibits no distension. There is no tenderness. There is no rebound and no guarding.  Musculoskeletal: Normal range of motion.  Neurological: She is alert.  Skin: Skin is warm and dry.  Psychiatric: She has a normal mood and affect.  Nursing note and vitals reviewed.    ED Treatments / Results  Labs (all labs ordered are listed, but only abnormal results are displayed) Labs Reviewed  BASIC METABOLIC PANEL - Abnormal; Notable for the following components:      Result Value   BUN 5 (*)    All other components within normal limits  HEPATIC FUNCTION PANEL - Abnormal; Notable for the following components:   Total Protein 8.3 (*)    AST 72 (*)    Alkaline Phosphatase 132 (*)    Total Bilirubin 1.4 (*)    Bilirubin, Direct 0.4 (*)    Indirect Bilirubin 1.0 (*)    All other components within normal limits  CBC  INFLUENZA PANEL BY PCR (TYPE A & B)  I-STAT TROPONIN, ED  I-STAT BETA HCG BLOOD, ED ([facility], [facility], [facility] ONLY)  POCT I-STAT TROPONIN I  I-STAT BETA HCG BLOOD, ED (NOT ORDERABLE)     EKG ED ECG REPORT   Date: 02/09/2018  Rate: 106  Rhythm: sinus tachycardia  QRS Axis: normal  Intervals: normal  ST/T Wave abnormalities: normal  Conduction Disutrbances:nonspecific intraventricular conduction delay  Narrative Interpretation: no sig changes from ekg dated 01/16/18  Old EKG Reviewed: unchanged  I have personally reviewed the EKG tracing and agree with the computerized printout as noted.   Radiology Dg Chest 2 View  Result Date: 02/09/2018 CLINICAL DATA:  Chest pain, dizziness, and shortness of breath. EXAM: CHEST - 2 VIEW COMPARISON:  08/30/2017 FINDINGS: The heart size and mediastinal contours are within normal limits. Both lungs are clear. The visualized skeletal structures are unremarkable. IMPRESSION: Negative.  No active cardiopulmonary disease. Electronically Signed   By: Myles Rosenthal M.D.   On:  02/09/2018 22:27    Procedures Procedures (including critical care time)  Medications Ordered in ED Medications  sodium chloride 0.9 % bolus 1,000 mL (1,000 mLs Intravenous New Bag/Given 02/09/18 2330)  ondansetron (ZOFRAN) injection 4 mg (4 mg Intravenous Given 02/09/18 2330)  ketorolac (TORADOL) 30 MG/ML injection 15 mg (15 mg Intravenous Given 02/09/18 2330)  promethazine (PHENERGAN) injection 12.5 mg (12.5 mg Intravenous Given 02/10/18 0156)  acetaminophen (TYLENOL) tablet 650 mg (650 mg Oral Given 02/10/18 0156)     Initial Impression / Assessment and Plan / ED Course  I have reviewed the triage vital signs and the nursing notes.  Pertinent labs & imaging results that were available during my care of the patient were reviewed by me and considered in my medical decision making (see chart for details).     Pt with sx suggesting viral gastroenteritis, family with same.  Labs stable, lft's greatly improved form last visit here.  Discussed results of her hepatitis panel, Hep C+ antibody.  Explained this suggests exposure,but does not mean she has active  Hep C.  She will require further testing by her pcp for this.  Other labs stable. She was given IV fluids 1 L, actively receiving.  Given IV zofran with continued nausea but no vomiting.  Added phenergan.  PO challenge prior to dc home.  If tolerates, will plan dc home.   Final Clinical Impressions(s) / ED Diagnoses   Final diagnoses:  Gastroenteritis    ED Discharge Orders         Ordered    promethazine (PHENERGAN) 25 MG tablet  Every 6 hours PRN     02/10/18 0154    diphenoxylate-atropine (LOMOTIL) 2.5-0.025 MG tablet  4 times daily PRN     02/10/18 0154           Burgess Amordol, Keirsten Matuska, PA-C 02/10/18 0205    Bethann BerkshireZammit, Joseph, MD 02/12/18 0800

## 2018-02-10 NOTE — ED Notes (Signed)
Pt given ginger ale and able to drink without any difficulty

## 2018-02-10 NOTE — Discharge Instructions (Addendum)
Rest and make sure you are drinking plenty of fluids.  Take the phenergan if needed for continued nausea or vomiting.  As discussed, your labs are stable today.  I suspect you have the same viral stomach flu your children are currently recovering from.  Get recheck by Dr Felecia ShellingFanta if your symptoms persist, return here for any worsened symptoms.  You may take lomotil if your diarrhea returns.  Only take this medicine though if the diarrhea returns otherwise it can make you quite constipated.  Also note - you will need further tests by Dr Felecia ShellingFanta to determine if you have actively cleared the Hepatitis C virus  or if you continue to harbor this virus.  Call Dr. Felecia ShellingFanta for followup of this.

## 2018-03-10 IMAGING — DX DG CHEST 2V
2 series · 2 of 2 positions shown · non-contrast
Comparison: None.

CLINICAL DATA: Chest pain starting last night at 10 p.m.

EXAM:
CHEST  2 VIEW

[chest pa]
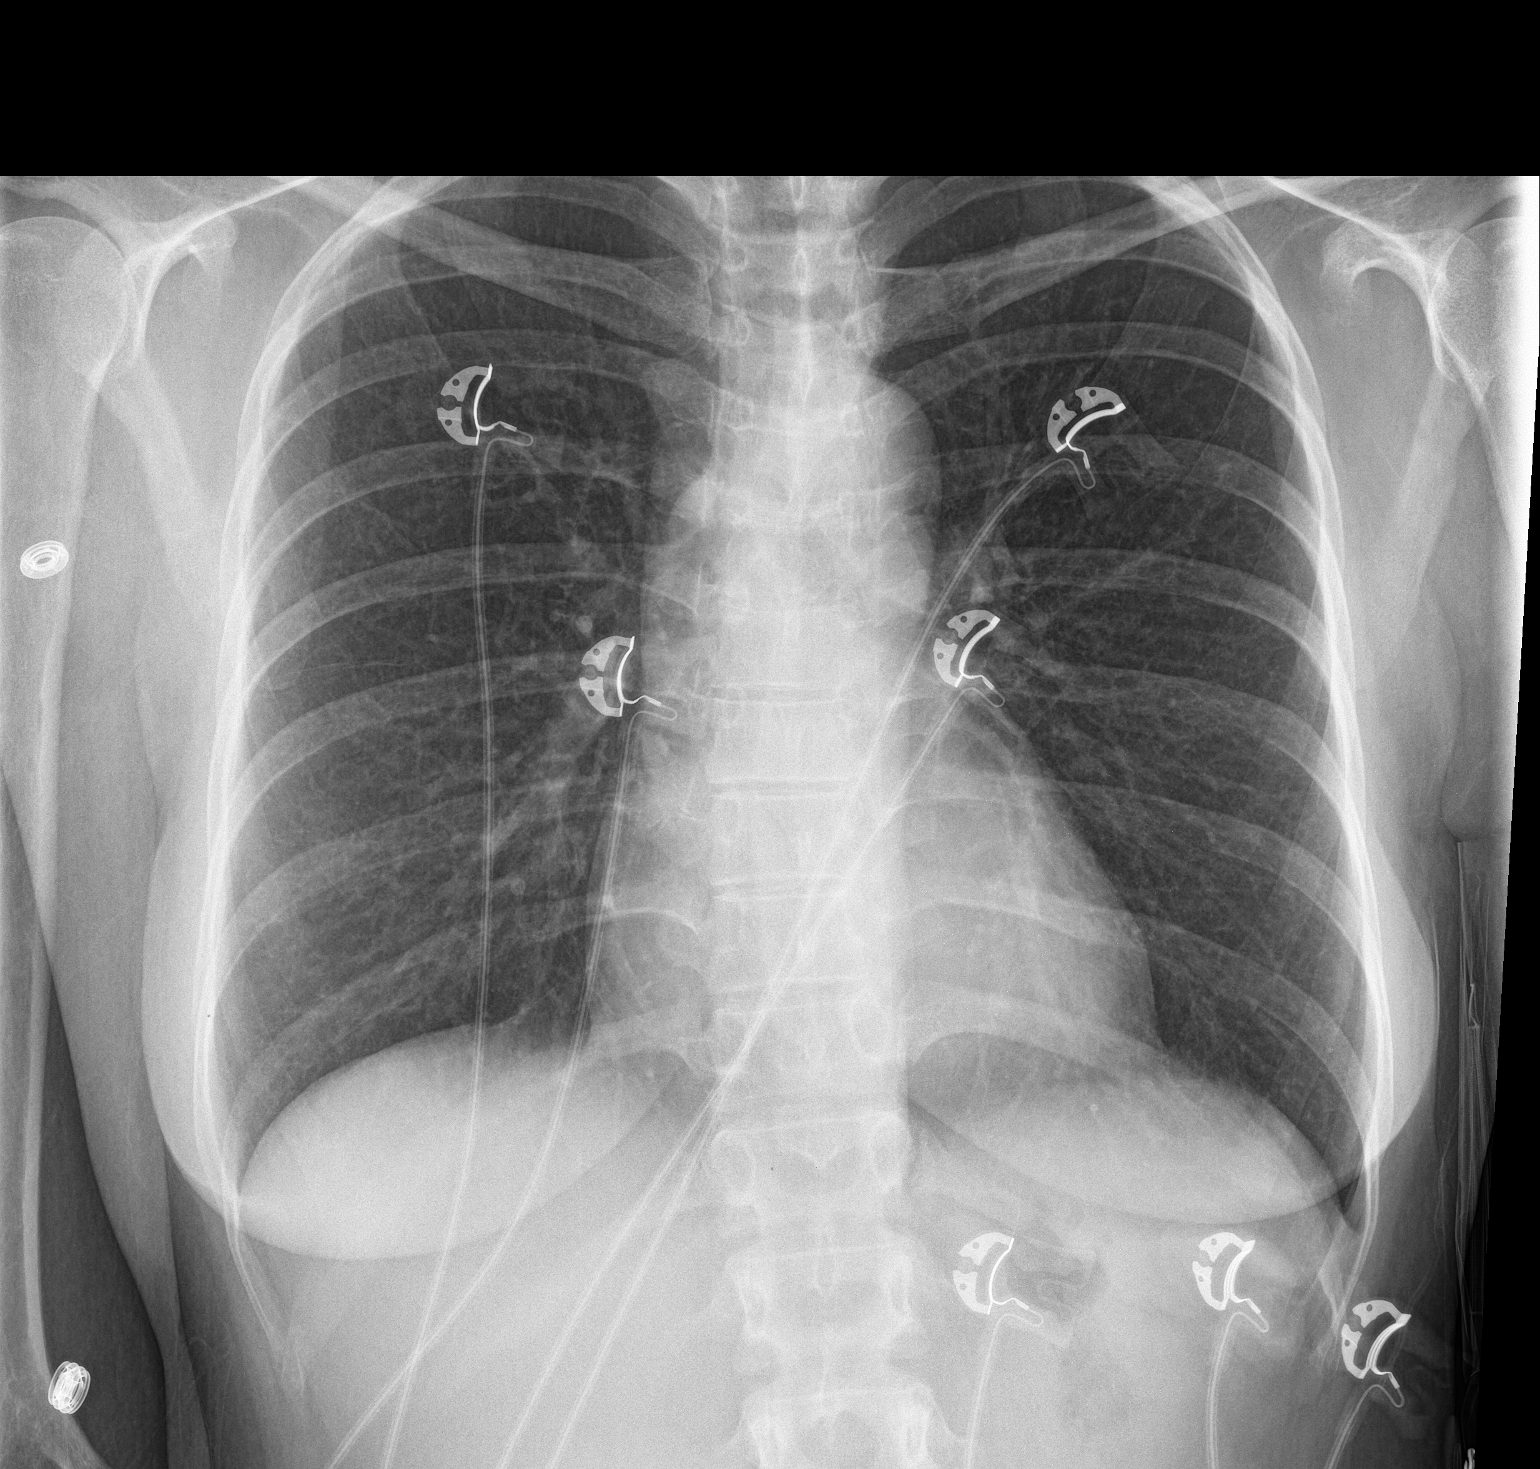

[chest lat]
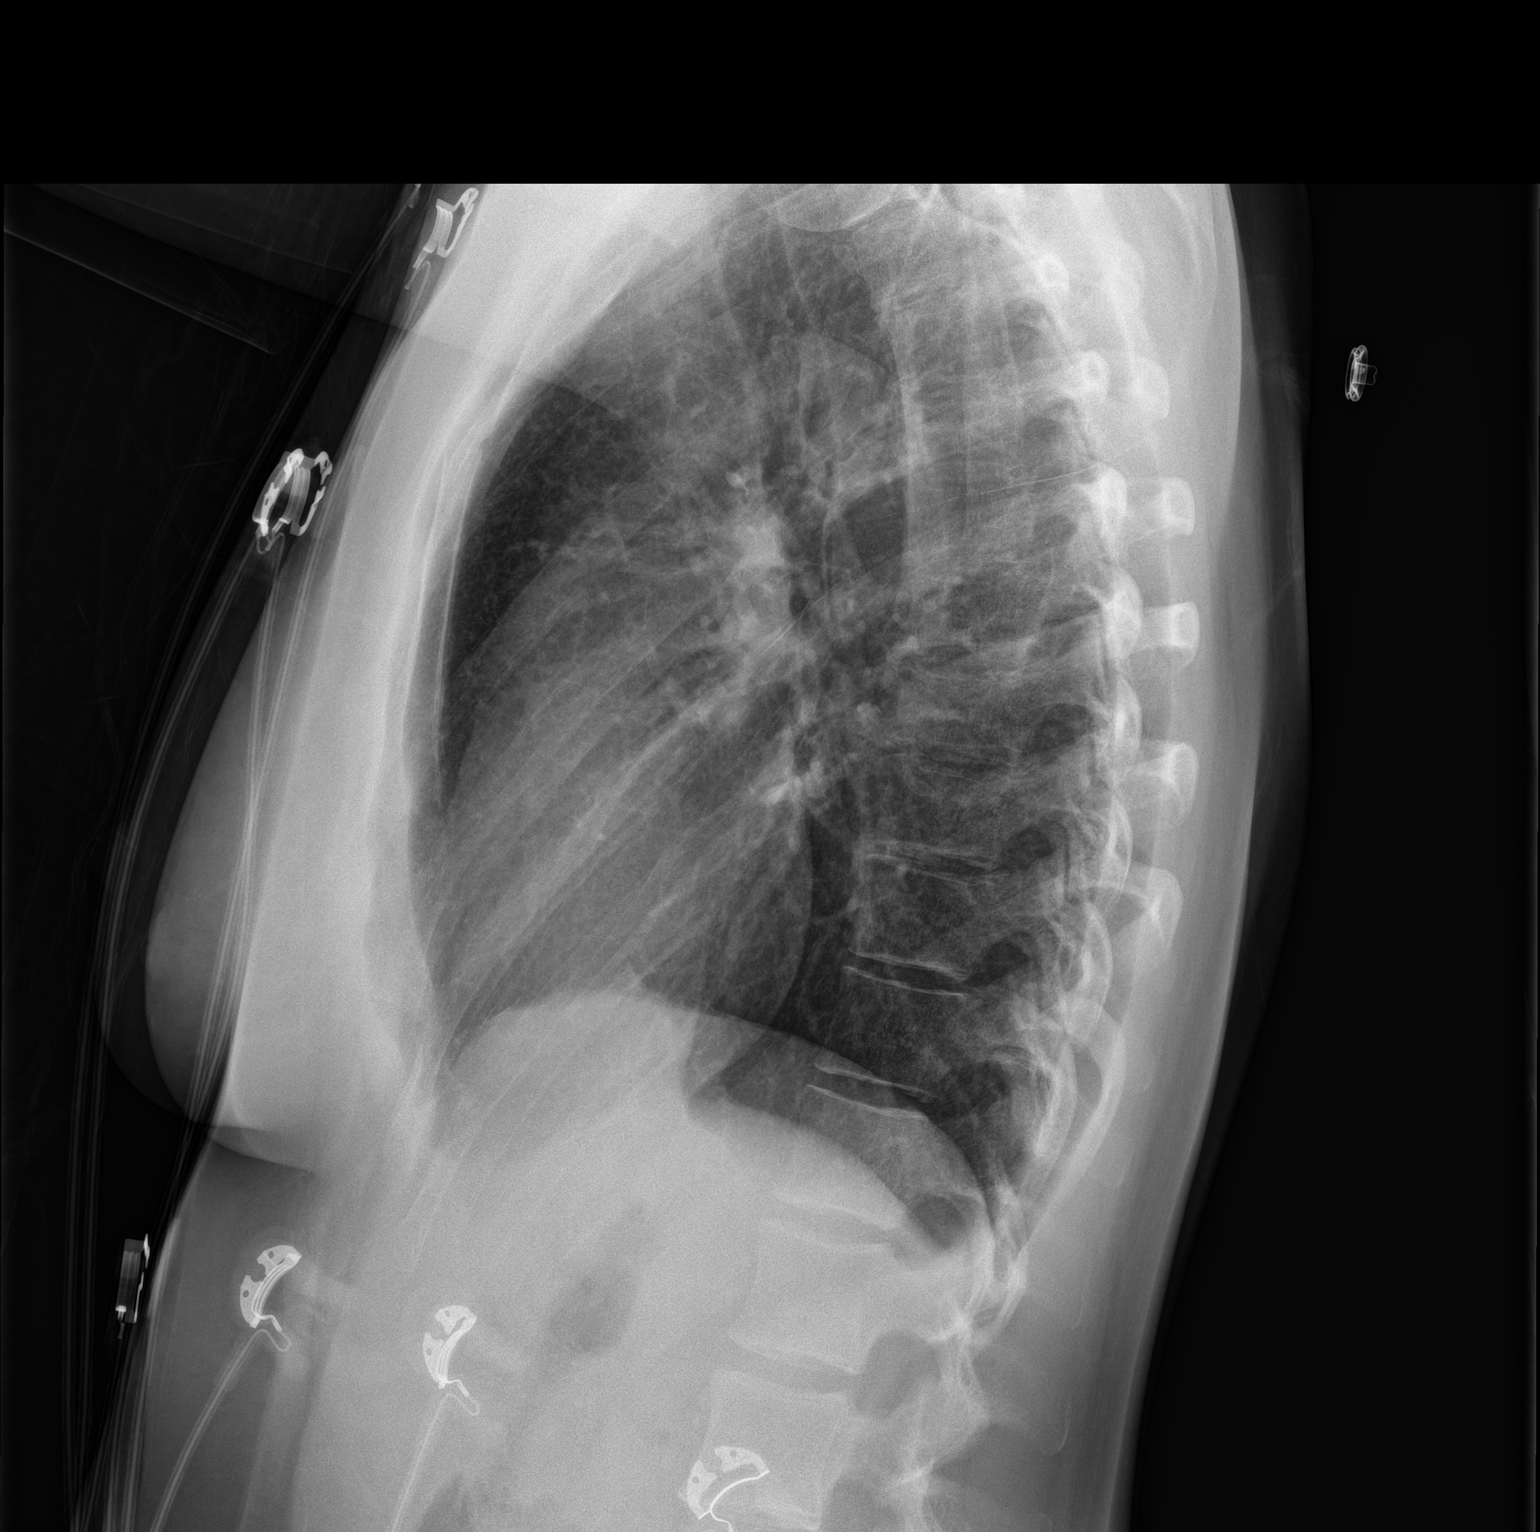

[2 of 2 positions shown; findings below may reference images not displayed]

FINDINGS: The heart size and mediastinal contours are within normal limits.
Both lungs are clear. The visualized skeletal structures are
unremarkable.
IMPRESSION: No active cardiopulmonary disease.

## 2018-04-21 ENCOUNTER — Encounter (INDEPENDENT_AMBULATORY_CARE_PROVIDER_SITE_OTHER): Payer: Self-pay | Admitting: Internal Medicine

## 2018-04-22 ENCOUNTER — Encounter (INDEPENDENT_AMBULATORY_CARE_PROVIDER_SITE_OTHER): Payer: Self-pay | Admitting: Internal Medicine

## 2018-07-28 NOTE — Progress Notes (Deleted)
Psychiatric Initial Adult Assessment   Patient Identification: Hannah Rhodes MRN:  [id number] Date of Evaluation:  07/28/2018 Referral Source: Gwenlyn Fudge, FNP Chief Complaint:   Visit Diagnosis: No diagnosis found.  History of Present Illness:   Hannah Rhodes is a 38 y.o. year old female with a history of anxiety, LFT abnormality, who is referred for anxiety .      Associated Signs/Symptoms: Depression Symptoms:  {DEPRESSION SYMPTOMS:20000} (Hypo) Manic Symptoms:  {BHH MANIC SYMPTOMS:22872} Anxiety Symptoms:  {BHH ANXIETY SYMPTOMS:22873} Psychotic Symptoms:  {BHH PSYCHOTIC SYMPTOMS:22874} PTSD Symptoms: {BHH PTSD SYMPTOMS:22875}  Past Psychiatric History:  Outpatient:  Psychiatry admission:  Previous suicide attempt:  Past trials of medication:  History of violence:   Previous Psychotropic Medications: {YES/NO:21197}  Substance Abuse History in the last 12 months:  {yes no:314532}  Consequences of Substance Abuse: {BHH CONSEQUENCES OF SUBSTANCE ABUSE:22880}  Past Medical History:  Past Medical History:  Diagnosis Date  . Anxiety   . Generalized headaches   . HPV (human papilloma virus) infection   . Hypertension   . Panic attack   . Tachycardia     Past Surgical History:  Procedure Laterality Date  . CESAREAN SECTION    . CESAREAN SECTION    . SMALL INTESTINE SURGERY      Family Psychiatric History: ***  Family History:  Family History  Problem Relation Age of Onset  . Anxiety disorder Sister   . Restless legs syndrome Sister   . Heart failure Mother   . CAD Mother   . Heart attack Mother     Social History:   Social History   Socioeconomic History  . Marital status: Single    Spouse name: Not on file  . Number of children: Not on file  . Years of education: Not on file  . Highest education level: Not on file  Occupational History  . Not on file  Social Needs  . Financial resource strain: Not on file  . Food insecurity:    Worry: Not on  file    Inability: Not on file  . Transportation needs:    Medical: Not on file    Non-medical: Not on file  Tobacco Use  . Smoking status: Current Every Day Smoker    Packs/day: 0.50  . Smokeless tobacco: Never Used  Substance and Sexual Activity  . Alcohol use: Yes    Comment: occ  . Drug use: Not Currently    Types: Marijuana    Comment: last use 4 months ago  . Sexual activity: Never  Lifestyle  . Physical activity:    Days per week: Not on file    Minutes per session: Not on file  . Stress: Not on file  Relationships  . Social connections:    Talks on phone: Not on file    Gets together: Not on file    Attends religious service: Not on file    Active member of club or organization: Not on file    Attends meetings of clubs or organizations: Not on file    Relationship status: Not on file  Other Topics Concern  . Not on file  Social History Narrative  . Not on file    Additional Social History: ***  Allergies:  No Known Allergies  Metabolic Disorder Labs: No results found for: HGBA1C, MPG No results found for: PROLACTIN No results found for: CHOL, TRIG, HDL, CHOLHDL, VLDL, LDLCALC Lab Results  Component Value Date   TSH 0.606 12/19/2016    Therapeutic  Level Labs: No results found for: LITHIUM No results found for: CBMZ No results found for: VALPROATE  Current Medications: Current Outpatient Medications  Medication Sig Dispense Refill  . ALPRAZolam (XANAX) 0.25 MG tablet Take 1 tablet (0.25 mg total) by mouth 3 (three) times daily as needed for anxiety. (Patient not taking: Reported on 02/09/2018) 6 tablet 0  . amLODipine (NORVASC) 5 MG tablet Take 1 tablet (5 mg total) by mouth daily. 180 tablet 3  . diphenoxylate-atropine (LOMOTIL) 2.5-0.025 MG tablet Take 1 tablet by mouth 4 (four) times daily as needed for diarrhea or loose stools. 10 tablet 0  . metoprolol tartrate (LOPRESSOR) 50 MG tablet Take 1 tablet (50 mg total) by mouth 2 (two) times daily. 180  tablet 3  . promethazine (PHENERGAN) 25 MG tablet Take 1 tablet (25 mg total) by mouth every 6 (six) hours as needed for nausea or vomiting. 15 tablet 0   No current facility-administered medications for this visit.     Musculoskeletal: Strength & Muscle Tone: N/A Gait & Station: N/A Patient leans: N/A  Psychiatric Specialty Exam: ROS  There were no vitals taken for this visit.There is no height or weight on file to calculate BMI.  General Appearance: {Appearance:22683}  Eye Contact:  {BHH EYE CONTACT:22684}  Speech:  Clear and Coherent  Volume:  Normal  Mood:  {BHH MOOD:22306}  Affect:  {Affect (PAA):22687}  Thought Process:  Coherent  Orientation:  Full (Time, Place, and Person)  Thought Content:  Logical  Suicidal Thoughts:  {ST/HT (PAA):22692}  Homicidal Thoughts:  {ST/HT (PAA):22692}  Memory:  Immediate;   Good  Judgement:  {Judgement (PAA):22694}  Insight:  {Insight (PAA):22695}  Psychomotor Activity:  Normal  Concentration:  Concentration: Good and Attention Span: Good  Recall:  Good  Fund of Knowledge:Good  Language: Good  Akathisia:  No  Handed:  Right  AIMS (if indicated):  not done  Assets:  Communication Skills Desire for Improvement  ADL's:  Intact  Cognition: WNL  Sleep:  {BHH GOOD/FAIR/POOR:22877}   Screenings:   Assessment and Plan:  Assessment  Plan  The patient demonstrates the following risk factors for suicide: Chronic risk factors for suicide include: {Chronic Risk Factors for RUEAVWU:98119147}Suicide:30414011}. Acute risk factors for suicide include: {Acute Risk Factors for WGNFAOZ:30865784}Suicide:30414012}. Protective factors for this patient include: {Protective Factors for Suicide ONGE:95284132}Risk:30414013}. Considering these factors, the overall suicide risk at this point appears to be {Desc; low/moderate/high:110033}. Patient {ACTION; IS/IS GMW:10272536}OT:21021397} appropriate for outpatient follow up.    Neysa Hottereina Shanea Karney, MD 5/11/202010:28 AM

## 2018-07-29 ENCOUNTER — Other Ambulatory Visit: Payer: Self-pay

## 2018-07-29 ENCOUNTER — Ambulatory Visit (HOSPITAL_COMMUNITY): Payer: Medicaid Other | Admitting: Psychiatry

## 2018-08-10 ENCOUNTER — Other Ambulatory Visit: Payer: Self-pay

## 2018-08-10 ENCOUNTER — Emergency Department (HOSPITAL_COMMUNITY)
Admission: EM | Admit: 2018-08-10 | Discharge: 2018-08-10 | Disposition: A | Discharge status: Home / Self Care | Payer: Medicaid Other | Attending: Emergency Medicine | Admitting: Emergency Medicine

## 2018-08-10 ENCOUNTER — Encounter (HOSPITAL_COMMUNITY): Payer: Self-pay | Admitting: *Deleted

## 2018-08-10 DIAGNOSIS — F17210 Nicotine dependence, cigarettes, uncomplicated: Secondary | ICD-10-CM | POA: Insufficient documentation

## 2018-08-10 DIAGNOSIS — H1131 Conjunctival hemorrhage, right eye: Secondary | ICD-10-CM | POA: Diagnosis not present

## 2018-08-10 DIAGNOSIS — Y929 Unspecified place or not applicable: Secondary | ICD-10-CM | POA: Diagnosis not present

## 2018-08-10 DIAGNOSIS — Z79899 Other long term (current) drug therapy: Secondary | ICD-10-CM | POA: Insufficient documentation

## 2018-08-10 DIAGNOSIS — I10 Essential (primary) hypertension: Secondary | ICD-10-CM | POA: Insufficient documentation

## 2018-08-10 DIAGNOSIS — H5711 Ocular pain, right eye: Secondary | ICD-10-CM | POA: Diagnosis present

## 2018-08-10 DIAGNOSIS — Y9389 Activity, other specified: Secondary | ICD-10-CM | POA: Insufficient documentation

## 2018-08-10 DIAGNOSIS — S0511XA Contusion of eyeball and orbital tissues, right eye, initial encounter: Secondary | ICD-10-CM | POA: Insufficient documentation

## 2018-08-10 DIAGNOSIS — Y999 Unspecified external cause status: Secondary | ICD-10-CM | POA: Diagnosis not present

## 2018-08-10 DIAGNOSIS — W208XXA Other cause of strike by thrown, projected or falling object, initial encounter: Secondary | ICD-10-CM | POA: Insufficient documentation

## 2018-08-10 MED ORDER — TETRACAINE HCL 0.5 % OP SOLN
2.0000 [drp] | Freq: Once | OPHTHALMIC | Status: DC
  Filled 2018-08-10: qty 4

## 2018-08-10 MED ORDER — FLUORESCEIN SODIUM 1 MG OP STRP
ORAL_STRIP | OPHTHALMIC | Status: AC
  Filled 2018-08-10: qty 1

## 2018-08-10 NOTE — ED Provider Notes (Signed)
[facility] EMERGENCY DEPARTMENT Provider Note   CSN: [id number] Arrival date & time: 08/10/18  0224    History   Chief Complaint Chief Complaint  Patient presents with  . Eye Pain    HPI Hannah Rhodes is a 38 y.o. female.   The history is provided by the patient.  Eye Pain   She has history of hypertension and comes in after having injured her right eye in a water balloon fight.  She was struck in the right eye.  She states that she is unable to see out of that eye.  She is complaining of pain which had been as severe as 10/10, but now is improved to 4/10.  She denies other injury.  Past Medical History:  Diagnosis Date  . Anxiety   . Generalized headaches   . HPV (human papilloma virus) infection   . Hypertension   . Panic attack   . Tachycardia     Patient Active Problem List   Diagnosis Date Noted  . DYSPNEA 10/24/2009  . ANXIETY STATE, UNSPECIFIED 10/21/2009  . NONDEPENDENT TOBACCO USE DISORDER 10/21/2009  . INSOMNIA UNSPECIFIED 10/21/2009  . PALPITATIONS 10/21/2009    Past Surgical History:  Procedure Laterality Date  . CESAREAN SECTION    . CESAREAN SECTION    . SMALL INTESTINE SURGERY       OB History   No obstetric history on file.      Home Medications    Prior to Admission medications   Medication Sig Start Date End Date Taking? Authorizing Provider  ALPRAZolam (XANAX) 0.25 MG tablet Take 1 tablet (0.25 mg total) by mouth 3 (three) times daily as needed for anxiety. Patient not taking: Reported on 02/09/2018 08/30/17   Loren Racer, MD  amLODipine (NORVASC) 5 MG tablet Take 1 tablet (5 mg total) by mouth daily. 05/14/17 08/31/18  Laqueta Linden, MD  diphenoxylate-atropine (LOMOTIL) 2.5-0.025 MG tablet Take 1 tablet by mouth 4 (four) times daily as needed for diarrhea or loose stools. 02/10/18   Burgess Amor, PA-C  metoprolol tartrate (LOPRESSOR) 50 MG tablet Take 1 tablet (50 mg total) by mouth 2 (two) times daily. 12/27/16 08/31/18   Laqueta Linden, MD  promethazine (PHENERGAN) 25 MG tablet Take 1 tablet (25 mg total) by mouth every 6 (six) hours as needed for nausea or vomiting. 02/10/18   Burgess Amor, PA-C    Family History Family History  Problem Relation Age of Onset  . Anxiety disorder Sister   . Restless legs syndrome Sister   . Heart failure Mother   . CAD Mother   . Heart attack Mother     Social History Social History   Tobacco Use  . Smoking status: Current Every Day Smoker    Packs/day: 0.50  . Smokeless tobacco: Never Used  Substance Use Topics  . Alcohol use: Yes    Comment: occ  . Drug use: Not Currently    Types: Marijuana    Comment: last use 4 months ago     Allergies   Patient has no known allergies.   Review of Systems Review of Systems  Eyes: Positive for pain.  All other systems reviewed and are negative.    Physical Exam Updated Vital Signs BP (!) 144/103   Pulse 90   Temp 98 F (36.7 C) (Oral)   Resp 16   Ht 4\' 10"  (1.473 m)   Wt 43.5 kg   SpO2 100%   BMI 20.06 kg/m   Physical Exam Vitals  signs and nursing note reviewed.    38 year old female, resting comfortably and in no acute distress. Vital signs are significant for elevated blood pressure. Oxygen saturation is  100%, which is normal. Head is normocephalic and atraumatic. PERRLA, EOMI. Oropharynx is clear.  There is a sub-conjunctival hemorrhage in the right eye medially.  Anterior chamber is clear without evidence of hyphema.  Fundus appears normal.  Slit-lamp was not functional.  I was stained with fluorescein and examined under Woods lamp and there is no evidence of corneal abrasion. Neck is nontender and supple without adenopathy or JVD. Back is nontender and there is no CVA tenderness. Lungs are clear without rales, wheezes, or rhonchi. Chest is nontender. Heart has regular rate and rhythm without murmur. Abdomen is soft, flat, nontender without masses or hepatosplenomegaly and peristalsis is  normoactive. Extremities have no cyanosis or edema, full range of motion is present. Skin is warm and dry without rash. Neurologic: Mental status is normal, cranial nerves are intact, there are no motor or sensory deficits.  ED Treatments / Results   Procedures Procedures  Medications Ordered in ED Medications  tetracaine (PONTOCAINE) 0.5 % ophthalmic solution 2 drop (has no administration in time range)  fluorescein 1 MG ophthalmic strip (has no administration in time range)     Initial Impression / Assessment and Plan / ED Course  I have reviewed the triage vital signs and the nursing notes.  Injury to right eye with subconjunctival hemorrhage.  No evidence of hyphema or retinal detachment.  After topical anesthesia with tetracaine, visual acuity was rechecked in vision in her right eye was 20/30.  Patient reassured that no evidence of significant injury present, advised to apply ice, take over-the-counter analgesics as needed.  Referred to ophthalmology for follow-up.  Final Clinical Impressions(s) / ED Diagnoses   Final diagnoses:  Traumatic subconjunctival hemorrhage of right eye  Contusion of right orbit, initial encounter  Elevated blood pressure reading with diagnosis of hypertension    ED Discharge Orders    None       Dione BoozeGlick, Jmarion Christiano, MD 08/10/18 [phone]

## 2018-08-10 NOTE — Discharge Instructions (Addendum)
Apply ice as needed.  Take ibuprofen or acetaminophen as needed for pain.  Return if  you are having any problems.  If your vision does not come back to normal, see the ophthalmologist.

## 2018-08-10 NOTE — ED Triage Notes (Signed)
Pt was playing with her kids in an water balloon fight when pt was hit in the right eye with one, c/o pain, decrease vision to right eye,

## 2018-08-19 ENCOUNTER — Emergency Department (HOSPITAL_COMMUNITY)
Admission: EM | Admit: 2018-08-19 | Discharge: 2018-08-19 | Disposition: A | Discharge status: Home / Self Care | Payer: Medicaid Other | Attending: Emergency Medicine | Admitting: Emergency Medicine

## 2018-08-19 ENCOUNTER — Other Ambulatory Visit: Payer: Self-pay

## 2018-08-19 ENCOUNTER — Encounter (HOSPITAL_COMMUNITY): Payer: Self-pay

## 2018-08-19 ENCOUNTER — Emergency Department (HOSPITAL_COMMUNITY): Payer: Medicaid Other

## 2018-08-19 DIAGNOSIS — Z23 Encounter for immunization: Secondary | ICD-10-CM | POA: Insufficient documentation

## 2018-08-19 DIAGNOSIS — I10 Essential (primary) hypertension: Secondary | ICD-10-CM | POA: Diagnosis not present

## 2018-08-19 DIAGNOSIS — Y929 Unspecified place or not applicable: Secondary | ICD-10-CM | POA: Insufficient documentation

## 2018-08-19 DIAGNOSIS — R030 Elevated blood-pressure reading, without diagnosis of hypertension: Secondary | ICD-10-CM

## 2018-08-19 DIAGNOSIS — Y999 Unspecified external cause status: Secondary | ICD-10-CM | POA: Insufficient documentation

## 2018-08-19 DIAGNOSIS — W010XXA Fall on same level from slipping, tripping and stumbling without subsequent striking against object, initial encounter: Secondary | ICD-10-CM | POA: Diagnosis not present

## 2018-08-19 DIAGNOSIS — M25561 Pain in right knee: Secondary | ICD-10-CM

## 2018-08-19 DIAGNOSIS — F17200 Nicotine dependence, unspecified, uncomplicated: Secondary | ICD-10-CM | POA: Insufficient documentation

## 2018-08-19 DIAGNOSIS — L03115 Cellulitis of right lower limb: Secondary | ICD-10-CM | POA: Diagnosis not present

## 2018-08-19 DIAGNOSIS — S80211A Abrasion, right knee, initial encounter: Secondary | ICD-10-CM | POA: Insufficient documentation

## 2018-08-19 DIAGNOSIS — Y939 Activity, unspecified: Secondary | ICD-10-CM | POA: Diagnosis not present

## 2018-08-19 DIAGNOSIS — S60811A Abrasion of right wrist, initial encounter: Secondary | ICD-10-CM | POA: Diagnosis not present

## 2018-08-19 DIAGNOSIS — Z79899 Other long term (current) drug therapy: Secondary | ICD-10-CM | POA: Insufficient documentation

## 2018-08-19 DIAGNOSIS — S8991XA Unspecified injury of right lower leg, initial encounter: Secondary | ICD-10-CM | POA: Diagnosis present

## 2018-08-19 MED ORDER — TETANUS-DIPHTH-ACELL PERTUSSIS 5-2.5-18.5 LF-MCG/0.5 IM SUSP
0.5000 mL | Freq: Once | INTRAMUSCULAR | Status: AC
  Administered 2018-08-19: 0.5 mL via INTRAMUSCULAR
  Filled 2018-08-19: qty 0.5

## 2018-08-19 MED ORDER — CEPHALEXIN 500 MG PO CAPS: 500.0000 mg | ORAL_CAPSULE | Freq: Four times a day (QID) | ORAL | 0 refills | Status: DC

## 2018-08-19 NOTE — Discharge Instructions (Addendum)
You have been diagnosed today with right knee pain, abrasion of the right knee and right wrist, cellulitis of the skin of the right knee, elevated blood pressure reading.  At this time there does not appear to be the presence of an emergent medical condition, however there is always the potential for conditions to change. Please read and follow the below instructions.  Please return to the Emergency Department immediately for any new or worsening symptoms. Please be sure to follow up with your Primary Care Provider within one week regarding your visit today; please call their office to schedule an appointment even if you are feeling better for a follow-up visit. It appears as though you may have a small skin infection surrounding the abrasion on your right knee, please take the antibiotic Keflex 4 times daily as prescribed to help treat this infection.  Please monitor for worsening signs of infection, your redness has been marked on your skin today, return to emergency department if the redness spreads past the marker as further evaluation and treatment may be necessary.  Follow-up with your primary care provider in 3 days for wound recheck, call their office tomorrow to schedule the appointment. Your x-ray today did not show any fracture or dislocation.  However unseen fractures may still be present, in addition a ligamentous and tendon injury are possible.  Please use the knee sleeve and crutches to help with your pain over the next few days.  As your pain improves be sure to perform range of motion exercises to avoid stiffness.  Use rest ice and elevation to help with pain and swelling.  As unseen fractures, ligamentous and tendon injury may be possible I advised that you follow-up with the orthopedic specialist Dr. Everardo Pacific for further evaluation of your right knee pain.  You may call his office tomorrow to schedule a follow-up appointment as further imaging may be necessary if symptoms do not  improve. Please keep your 2 small abrasions clean, rinsing gently with soap and water twice daily and keep covered with clean bandages.  Return for signs of infection. Your blood pressure was elevated today, please call your primary care provider's office tomorrow to schedule a follow-up for blood pressure recheck and medication management.  Please refer to the hypertension handout for dietary changes in the assist with blood pressure.  Get help right away if: You get a very bad headache. You start to feel confused. You feel weak or numb. You feel faint. You get very bad pain in your: Chest. Belly (abdomen). You throw up (vomit) more than once. You have trouble breathing.  Get help right away if: You have a red streak going away from your wound. The edges of the wound open up and separate. You have drainage or swelling Your wound is bleeding, and the bleeding does not stop with gentle pressure. You have a rash. You faint. You have trouble breathing. You have fever Get help right away if: Your knee swells, and the swelling gets worse. You cannot move your knee. You have very bad knee pain. Any new/concerning or worsening symptoms  Please read the additional information packets attached to your discharge summary.  Do not take your medicine if  develop an itchy rash, swelling in your mouth or lips, or difficulty breathing.  Call 911/seek immediate emergency medical attention if this occurs.

## 2018-08-19 NOTE — ED Triage Notes (Signed)
Pt tripped over dog and fell and hit her right knee yesterday. Pt c/o knee pain. Pt denies hitting head or LOC.

## 2018-08-19 NOTE — ED Provider Notes (Addendum)
[facility] EMERGENCY DEPARTMENT Provider Note   CSN: [id number] Arrival date & time: 08/19/18  1611    History   Chief Complaint Chief Complaint  Patient presents with  . Knee Pain    HPI Hannah Rhodes is a 38 y.o. female presenting today for right knee pain.  Patient reports that she tripped over her dog yesterday falling forward directly onto her right knee.  Patient reports that she caught herself on her right knee as well as right arm.  Patient reports that she had an immediate moderate intensity throbbing sensation to her right knee that has been constant but is worsened with palpation and improved with rest and elevation.  Patient reports pain is slightly improved since onset with time.  Denies medication use prior to arrival.  Patient reports that she has a abrasion to her right knee as well as a small abrasion to her right wrist.  She denies any pain of the right wrist.  She denies head injury, loss of consciousness, blood thinner use, neck/back pain, chest/abdominal pain or any additional injuries.     HPI  Past Medical History:  Diagnosis Date  . Anxiety   . Generalized headaches   . HPV (human papilloma virus) infection   . Hypertension   . Panic attack   . Tachycardia     Patient Active Problem List   Diagnosis Date Noted  . DYSPNEA 10/24/2009  . ANXIETY STATE, UNSPECIFIED 10/21/2009  . NONDEPENDENT TOBACCO USE DISORDER 10/21/2009  . INSOMNIA UNSPECIFIED 10/21/2009  . PALPITATIONS 10/21/2009    Past Surgical History:  Procedure Laterality Date  . CESAREAN SECTION    . CESAREAN SECTION    . SMALL INTESTINE SURGERY       OB History   No obstetric history on file.      Home Medications    Prior to Admission medications   Medication Sig Start Date End Date Taking? Authorizing Provider  amLODipine (NORVASC) 5 MG tablet Take 1 tablet (5 mg total) by mouth daily. 05/14/17 08/31/18  Laqueta Linden, MD  cephALEXin (KEFLEX) 500 MG capsule Take 1 capsule  (500 mg total) by mouth 4 (four) times daily for 7 days. 08/19/18 08/26/18  Harlene Salts A, PA-C  metoprolol tartrate (LOPRESSOR) 50 MG tablet Take 1 tablet (50 mg total) by mouth 2 (two) times daily. 12/27/16 08/31/18  Laqueta Linden, MD  promethazine (PHENERGAN) 25 MG tablet Take 1 tablet (25 mg total) by mouth every 6 (six) hours as needed for nausea or vomiting. 02/10/18   Burgess Amor, PA-C    Family History Family History  Problem Relation Age of Onset  . Anxiety disorder Sister   . Restless legs syndrome Sister   . Heart failure Mother   . CAD Mother   . Heart attack Mother     Social History Social History   Tobacco Use  . Smoking status: Current Every Day Smoker    Packs/day: 0.50  . Smokeless tobacco: Never Used  Substance Use Topics  . Alcohol use: Yes    Comment: occ  . Drug use: Not Currently    Types: Marijuana    Comment: last use 4 months ago     Allergies   Patient has no known allergies.   Review of Systems Review of Systems  Constitutional: Negative.  Negative for chills and fever.  Eyes: Negative.  Negative for visual disturbance.  Respiratory: Negative.  Negative for shortness of breath.   Cardiovascular: Negative.  Negative for chest pain.  Gastrointestinal: Negative.  Negative for abdominal pain, nausea and vomiting.  Musculoskeletal: Positive for arthralgias (Right knee pain). Negative for back pain, joint swelling and neck pain.  Skin: Positive for color change (Erythema surrounding right knee abrasion) and wound (Abrasion of right knee and right wrist).  Neurological: Negative.  Negative for syncope, weakness, numbness and headaches.   Physical Exam Updated Vital Signs BP (!) 141/102 (BP Location: Right Arm)   Pulse 71   Temp 98.6 F (37 C) (Oral)   Resp 16   Ht 4\' 10"  (1.473 m)   Wt 44 kg   LMP 07/18/2018   SpO2 100%   BMI 20.27 kg/m   Physical Exam Constitutional:      General: She is not in acute distress.    Appearance:  Normal appearance. She is well-developed. She is not ill-appearing or diaphoretic.  HENT:     Head: Normocephalic and atraumatic. No raccoon eyes or Battle's sign.     Jaw: There is normal jaw occlusion. No trismus.     Right Ear: External ear normal.     Left Ear: External ear normal.     Nose: Nose normal. No rhinorrhea.     Right Nostril: No epistaxis.     Left Nostril: No epistaxis.     Mouth/Throat:     Mouth: Mucous membranes are moist.     Pharynx: Oropharynx is clear.  Eyes:     General: Vision grossly intact. Gaze aligned appropriately.     Extraocular Movements: Extraocular movements intact.     Conjunctiva/sclera: Conjunctivae normal.     Pupils: Pupils are equal, round, and reactive to light.  Neck:     Musculoskeletal: Full passive range of motion without pain, normal range of motion and neck supple. No spinous process tenderness or muscular tenderness.     Trachea: Trachea and phonation normal. No tracheal tenderness or tracheal deviation.  Cardiovascular:     Rate and Rhythm: Normal rate and regular rhythm.     Pulses:          Radial pulses are 2+ on the right side and 2+ on the left side.       Dorsalis pedis pulses are 2+ on the right side and 2+ on the left side.       Posterior tibial pulses are 2+ on the right side and 2+ on the left side.  Pulmonary:     Effort: Pulmonary effort is normal. No accessory muscle usage or respiratory distress.     Breath sounds: Normal air entry.  Chest:     Chest wall: No tenderness.  Abdominal:     General: There is no distension.     Palpations: Abdomen is soft.     Tenderness: There is no abdominal tenderness. There is no guarding or rebound.  Musculoskeletal: Normal range of motion.     Right knee: She exhibits erythema. She exhibits normal range of motion, no swelling and no deformity. Tenderness found.     Comments: No midline C/T/L spinal tenderness to palpation, no paraspinal muscle tenderness, no deformity, crepitus,  or step-off noted. No sign of injury to the neck or back. - Hips stable to compression bilaterally without pain.  Patient is able to bring bilateral knees to chest without pain or difficulty. - Right Knee: Approximately 2 cm in diameter abrasion present to right knee with mild surrounding erythema that is blanchable to palpation.  No swelling is present and minimal tenderness.  No fluctuance or induration.   Tenderness  directly over the abrasion and patella. Active and passive flexion and extension intact with minimal pain and no crepitus. Negative anterior/poster drawer bilaterally. Negative ballottement test. No varus or valgus laxity or locking. No tenderness to palpation of hips or ankles.Compartments soft. Neurovascularly intact distally to site of injury.  Bladder refill and sensation intact to all 10 toes.  Pedal pulses intact and equal bilaterally. - All other major joints were brought through range of motion without pain or crepitus.  Appropriate range of motion and strength.   Feet:     Right foot:     Protective Sensation: 5 sites tested. 5 sites sensed.     Left foot:     Protective Sensation: 5 sites tested. 5 sites sensed.  Skin:    General: Skin is warm and dry.          Comments: Small superficial approximately 1 cm abrasion present to right wrist without signs of infection.  Full range of motion appropriate strength without pain of the right wrist. - Abrasion with surrounding blanchable erythema and warmth of the skin overlying the right patella, see picture below.  Neurological:     Mental Status: She is alert.     GCS: GCS eye subscore is 4. GCS verbal subscore is 5. GCS motor subscore is 6.     Comments: Speech is clear and goal oriented, follows commands Major Cranial nerves without deficit, no facial droop Moves extremities without ataxia, coordination intact  Psychiatric:        Behavior: Behavior normal.        ED Treatments / Results  Labs (all labs  ordered are listed, but only abnormal results are displayed) Labs Reviewed - No data to display  EKG None  Radiology Dg Knee Complete 4 Views Right  Result Date: 08/19/2018 CLINICAL DATA:  Right knee pain after fall yesterday. EXAM: RIGHT KNEE - COMPLETE 4+ VIEW COMPARISON:  None. FINDINGS: No evidence of fracture, dislocation, or joint effusion. No evidence of arthropathy or other focal bone abnormality. Soft tissues are unremarkable. IMPRESSION: Negative. Electronically Signed   By: Lupita RaiderJames  Green Jr M.D.   On: 08/19/2018 17:19    Procedures Procedures (including critical care time)  Medications Ordered in ED Medications  Tdap (BOOSTRIX) injection 0.5 mL (0.5 mLs Intramuscular Given 08/19/18 1837)     Initial Impression / Assessment and Plan / ED Course  I have reviewed the triage vital signs and the nursing notes.  Pertinent labs & imaging results that were available during my care of the patient were reviewed by me and considered in my medical decision making (see chart for details).    38 year old female presents today following fall that occurred yesterday.  No signs of injury to the head, neck, chest abdomen back or pelvis.  She has small abrasion to the right wrist, neurovascular intact to the right upper extremity with full range of motion and appropriate strength without pain, no indication for imaging of this area.  Additionally patient with abrasion overlying the right knee with what appears to be a early surrounding cellulitis.  She has full range of motion and appropriate strength with minimal pain, there is no joint effusion on my exam, do not suspect septic arthritis, DVT or compartment syndrome.  She is neurovascular intact to bilateral lower extremities.  We will treat patient with Keflex 4 times daily for her cellulitis, wound has been demarcated here in the emergency department and patient has been encouraged to follow-up with PCP in 3 days  for recheck and to return sooner  if erythema expands beyond the borders.  DG Right Knee: IMPRESSION: Negative.  Patient has been informed of imaging findings today, she is aware that occult fracture may be present and that ligamentous and tendon injury is possible.  She is ambulated here in the emergency department for me without assistance with slight limp secondary to pain.  She has been given knee sleeve and crutches and informed of rice therapy and given orthopedic referral if needed.  Patient with strain/sprain, contusion and cellulitis as cause of her pain today do not suspect dislocation based on mechanism, knee is stable on my examination without major ligamentous laxity.  I have encouraged patient to call Ortho for follow-up.  Additionally patient refused pregnancy test today citing that she is abstinent.  At this time there does not appear to be any evidence of an acute emergency medical condition and the patient appears stable for discharge with appropriate outpatient follow up. Diagnosis was discussed with patient who verbalizes understanding of care plan and is agreeable to discharge. I have discussed return precautions with patient who verbalizes understanding of return precautions. Patient encouraged to follow-up with their PCP and ortho. All questions answered.  Patient has been discharged in good condition.   Note: Portions of this report may have been transcribed using voice recognition software. Every effort was made to ensure accuracy; however, inadvertent computerized transcription errors may still be present. Final Clinical Impressions(s) / ED Diagnoses   Final diagnoses:  Acute pain of right knee  Abrasion, right knee, initial encounter  Abrasion of right wrist, initial encounter  Cellulitis of right lower extremity  Elevated blood pressure reading    ED Discharge Orders         Ordered    cephALEXin (KEFLEX) 500 MG capsule  4 times daily     08/19/18 1851           Bill Salinas, PA-C  08/19/18 1935    Elizabeth Palau 08/19/18 1952    Pricilla Loveless, MD 08/21/18 1919

## 2018-08-26 ENCOUNTER — Emergency Department (HOSPITAL_COMMUNITY)
Admission: EM | Admit: 2018-08-26 | Discharge: 2018-08-26 | Disposition: A | Discharge status: Home / Self Care | Payer: Medicaid Other | Attending: Emergency Medicine | Admitting: Emergency Medicine

## 2018-08-26 ENCOUNTER — Other Ambulatory Visit: Payer: Self-pay

## 2018-08-26 ENCOUNTER — Encounter (HOSPITAL_COMMUNITY): Payer: Self-pay

## 2018-08-26 DIAGNOSIS — N390 Urinary tract infection, site not specified: Secondary | ICD-10-CM | POA: Diagnosis not present

## 2018-08-26 DIAGNOSIS — K7689 Other specified diseases of liver: Secondary | ICD-10-CM | POA: Diagnosis not present

## 2018-08-26 DIAGNOSIS — M549 Dorsalgia, unspecified: Secondary | ICD-10-CM | POA: Diagnosis present

## 2018-08-26 DIAGNOSIS — Z79899 Other long term (current) drug therapy: Secondary | ICD-10-CM | POA: Insufficient documentation

## 2018-08-26 DIAGNOSIS — R748 Abnormal levels of other serum enzymes: Secondary | ICD-10-CM | POA: Diagnosis not present

## 2018-08-26 DIAGNOSIS — I10 Essential (primary) hypertension: Secondary | ICD-10-CM | POA: Insufficient documentation

## 2018-08-26 DIAGNOSIS — F17210 Nicotine dependence, cigarettes, uncomplicated: Secondary | ICD-10-CM | POA: Insufficient documentation

## 2018-08-26 DIAGNOSIS — E876 Hypokalemia: Secondary | ICD-10-CM | POA: Diagnosis not present

## 2018-08-26 LAB — CBC WITH DIFFERENTIAL/PLATELET
Abs Immature Granulocytes: 0.02 10*3/uL (ref 0.00–0.07)
Basophils Absolute: 0 10*3/uL (ref 0.0–0.1)
Basophils Relative: 0 %
Eosinophils Absolute: 0 10*3/uL (ref 0.0–0.5)
Eosinophils Relative: 1 %
HCT: 36.6 % (ref 36.0–46.0)
Hemoglobin: 12.3 g/dL (ref 12.0–15.0)
Immature Granulocytes: 0 %
Lymphocytes Relative: 23 %
Lymphs Abs: 1.3 10*3/uL (ref 0.7–4.0)
MCH: 32.1 pg (ref 26.0–34.0)
MCHC: 33.6 g/dL (ref 30.0–36.0)
MCV: 95.6 fL (ref 80.0–100.0)
Monocytes Absolute: 0.3 10*3/uL (ref 0.1–1.0)
Monocytes Relative: 5 %
Neutro Abs: 4 10*3/uL (ref 1.7–7.7)
Neutrophils Relative %: 71 %
Platelets: 210 10*3/uL (ref 150–400)
RBC: 3.83 MIL/uL — ABNORMAL LOW (ref 3.87–5.11)
RDW: 14.8 % (ref 11.5–15.5)
WBC: 5.6 10*3/uL (ref 4.0–10.5)
nRBC: 0 % (ref 0.0–0.2)

## 2018-08-26 LAB — URINALYSIS, ROUTINE W REFLEX MICROSCOPIC
Glucose, UA: NEGATIVE mg/dL
Ketones, ur: NEGATIVE mg/dL
Nitrite: NEGATIVE
Protein, ur: NEGATIVE mg/dL
Specific Gravity, Urine: 1.026 (ref 1.005–1.030)
pH: 5 (ref 5.0–8.0)

## 2018-08-26 LAB — COMPREHENSIVE METABOLIC PANEL
ALT: 151 U/L — ABNORMAL HIGH (ref 0–44)
AST: 498 U/L — ABNORMAL HIGH (ref 15–41)
Albumin: 3.8 g/dL (ref 3.5–5.0)
Alkaline Phosphatase: 110 U/L (ref 38–126)
Anion gap: 10 (ref 5–15)
BUN: 6 mg/dL (ref 6–20)
CO2: 27 mmol/L (ref 22–32)
Calcium: 9.2 mg/dL (ref 8.9–10.3)
Chloride: 99 mmol/L (ref 98–111)
Creatinine, Ser: 0.49 mg/dL (ref 0.44–1.00)
GFR calc Af Amer: >60 mL/min (ref >60)
GFR calc non Af Amer: >60 mL/min (ref >60)
Glucose, Bld: 120 mg/dL — ABNORMAL HIGH (ref 70–99)
Potassium: 3 mmol/L — ABNORMAL LOW (ref 3.5–5.1)
Sodium: 136 mmol/L (ref 135–145)
Total Bilirubin: 3 mg/dL — ABNORMAL HIGH (ref 0.3–1.2)
Total Protein: 7.2 g/dL (ref 6.5–8.1)

## 2018-08-26 LAB — LIPASE, BLOOD: Lipase: 33 U/L (ref 11–51)

## 2018-08-26 MED ORDER — TRAMADOL HCL 50 MG PO TABS
100.0000 mg | ORAL_TABLET | Freq: Once | ORAL | Status: AC
  Administered 2018-08-26: 18:00:00 100 mg via ORAL
  Filled 2018-08-26: qty 2

## 2018-08-26 MED ORDER — ONDANSETRON HCL 4 MG PO TABS
4.0000 mg | ORAL_TABLET | Freq: Once | ORAL | Status: AC
  Administered 2018-08-26: 4 mg via ORAL
  Filled 2018-08-26: qty 1

## 2018-08-26 MED ORDER — POTASSIUM CHLORIDE CRYS ER 20 MEQ PO TBCR
40.0000 meq | EXTENDED_RELEASE_TABLET | Freq: Once | ORAL | Status: AC
  Administered 2018-08-26 (×2): 40 meq via ORAL
  Filled 2018-08-26: qty 2

## 2018-08-26 MED ORDER — PROMETHAZINE HCL 12.5 MG PO TABS: 12.5000 mg | ORAL_TABLET | Freq: Four times a day (QID) | ORAL | 0 refills | Status: DC | PRN

## 2018-08-26 MED ORDER — TRAMADOL HCL 50 MG PO TABS: 50.0000 mg | ORAL_TABLET | Freq: Four times a day (QID) | ORAL | 0 refills | Status: DC | PRN

## 2018-08-26 MED ORDER — CYCLOBENZAPRINE HCL 10 MG PO TABS
10.0000 mg | ORAL_TABLET | Freq: Once | ORAL | Status: AC
  Administered 2018-08-26: 10 mg via ORAL
  Filled 2018-08-26: qty 1

## 2018-08-26 MED ORDER — CEPHALEXIN 500 MG PO CAPS: 500.0000 mg | ORAL_CAPSULE | Freq: Four times a day (QID) | ORAL | 0 refills | Status: DC

## 2018-08-26 MED ORDER — POTASSIUM CHLORIDE CRYS ER 20 MEQ PO TBCR
EXTENDED_RELEASE_TABLET | ORAL | Status: AC
  Administered 2018-08-26: 19:00:00 40 meq via ORAL
  Filled 2018-08-26: qty 1

## 2018-08-26 MED ORDER — SODIUM CHLORIDE 0.9 % IV SOLN
1000.0000 mL | INTRAVENOUS | Status: DC
  Administered 2018-08-26: 1000 mL via INTRAVENOUS

## 2018-08-26 MED ORDER — CYCLOBENZAPRINE HCL 10 MG PO TABS: 10.0000 mg | ORAL_TABLET | Freq: Three times a day (TID) | ORAL | 0 refills | Status: DC

## 2018-08-26 MED ORDER — SODIUM CHLORIDE 0.9 % IV BOLUS (SEPSIS)
1000.0000 mL | Freq: Once | INTRAVENOUS | Status: AC
  Administered 2018-08-26: 1000 mL via INTRAVENOUS

## 2018-08-26 MED ORDER — CEPHALEXIN 500 MG PO CAPS
500.0000 mg | ORAL_CAPSULE | Freq: Once | ORAL | Status: AC
  Administered 2018-08-26: 500 mg via ORAL
  Filled 2018-08-26: qty 1

## 2018-08-26 NOTE — Discharge Instructions (Addendum)
Your lab tests reveal elevations in your liver function studies.  Please call the radiology department for an appointment for an ultrasound of your abdomen to further evaluate your liver functions.  Please do not eat or drink after midnight tonight until you have the ultrasound study.  It is important that you abstain from all alcohol.  Your potassium was measured tonight and found to be low.  Included in your discharge instructions are list of the potassium content in foods.  Please increase foods high in potassium.  Your urine shows evidence of an early urinary tract infection.  Please use Keflex with breakfast, lunch, dinner, and at bedtime.

## 2018-08-26 NOTE — ED Provider Notes (Signed)
[facility] EMERGENCY DEPARTMENT Provider Note   CSN: [id number] Arrival date & time: 08/26/18  1431    History   Chief Complaint Chief Complaint  Patient presents with  . Back Pain    HPI Hannah Rhodes is a 38 y.o. female.     Patient is a 38 year old female who presents to the emergency department with a complaint of back pain and liver pain.  The patient states that her back pain started on yesterday, June 8.  The patient states she has a history of degenerative disc disease.  She says that she has a sharp pain in her mid back she does not recall any specific lifting pushing or pulling, but states that she has problems with her back from time to time.  She is not had any loss of bowel or bladder function.  She has no problems with numbness in the saddle areas.  She says this pain feels different and more severe than her usual so she came in to get it checked.  The patient also states that she is requesting evaluation of her liver.  She says that she has been diagnosed with a fatty liver issue.  She states that she also has a history of alcohol abuse in the past.  She is not used alcohol for nearly a year.  She states that it was probably a year since her last episode of pain related to her liver.  She says this feels like her previous liver pain except more intense.  The patient does admit that over the weekend she had alcohol, and feels that this is the cause of her having pain.  She has had 1 or 2 episodes of vomiting.  No blood in the vomitus.  No blood in her stool.  No high fever reported.  Nothing is made either of these pains any better.  Certain movements make the back pain worse.  She presents now for assistance with these issues.  The history is provided by the patient.  Back Pain  Associated symptoms: abdominal pain   Associated symptoms: no chest pain and no dysuria     Past Medical History:  Diagnosis Date  . Anxiety   . Generalized headaches   . HPV (human papilloma  virus) infection   . Hypertension   . Panic attack   . Tachycardia     Patient Active Problem List   Diagnosis Date Noted  . DYSPNEA 10/24/2009  . ANXIETY STATE, UNSPECIFIED 10/21/2009  . NONDEPENDENT TOBACCO USE DISORDER 10/21/2009  . INSOMNIA UNSPECIFIED 10/21/2009  . PALPITATIONS 10/21/2009    Past Surgical History:  Procedure Laterality Date  . CESAREAN SECTION    . CESAREAN SECTION    . SMALL INTESTINE SURGERY       OB History   No obstetric history on file.      Home Medications    Prior to Admission medications   Medication Sig Start Date End Date Taking? Authorizing Provider  acetaminophen (TYLENOL) 500 MG tablet Take 500 mg by mouth every 6 (six) hours as needed for mild pain or moderate pain.   Yes [provider]  amLODipine (NORVASC) 5 MG tablet Take 1 tablet (5 mg total) by mouth daily. 05/14/17 08/31/18 Yes Laqueta LindenKoneswaran, Suresh A, MD  cephALEXin (KEFLEX) 500 MG capsule Take 1 capsule (500 mg total) by mouth 4 (four) times daily for 7 days. 08/19/18 08/26/18 Yes Harlene SaltsMorelli, Brandon A, PA-C  metoprolol tartrate (LOPRESSOR) 50 MG tablet Take 1 tablet (50 mg total) by  mouth 2 (two) times daily. 12/27/16 08/31/18 Yes Herminio Commons, MD    Family History Family History  Problem Relation Age of Onset  . Anxiety disorder Sister   . Restless legs syndrome Sister   . Heart failure Mother   . CAD Mother   . Heart attack Mother     Social History Social History   Tobacco Use  . Smoking status: Current Every Day Smoker    Packs/day: 0.50  . Smokeless tobacco: Never Used  Substance Use Topics  . Alcohol use: Yes    Comment: occ  . Drug use: Not Currently    Types: Marijuana    Comment: last use 4 months ago     Allergies   Patient has no known allergies.   Review of Systems Review of Systems  Constitutional: Negative for activity change.       All ROS Neg except as noted in HPI  HENT: Negative.   Eyes: Negative for photophobia and discharge.   Respiratory: Negative for cough, shortness of breath and wheezing.   Cardiovascular: Negative for chest pain and palpitations.  Gastrointestinal: Positive for abdominal pain, nausea and vomiting. Negative for blood in stool.  Genitourinary: Negative for dysuria, frequency and hematuria.  Musculoskeletal: Positive for back pain. Negative for arthralgias and neck pain.  Skin: Negative.   Neurological: Negative for dizziness, seizures and speech difficulty.  Psychiatric/Behavioral: Negative for confusion and hallucinations.     Physical Exam Updated Vital Signs BP (!) 134/95 (BP Location: Right Arm)   Pulse 99   Temp 99.6 F (37.6 C) (Oral)   Resp 17   Ht 4\' 10"  (1.473 m)   Wt 44 kg   LMP 08/22/2018   SpO2 100%   BMI 20.27 kg/m   Physical Exam Vitals signs and nursing note reviewed.  Constitutional:      Appearance: She is well-developed. She is not toxic-appearing.  HENT:     Head: Normocephalic.     Right Ear: Tympanic membrane and external ear normal.     Left Ear: Tympanic membrane and external ear normal.  Eyes:     General: Lids are normal.     Pupils: Pupils are equal, round, and reactive to light.  Neck:     Musculoskeletal: Normal range of motion and neck supple.     Vascular: No carotid bruit.  Cardiovascular:     Rate and Rhythm: Normal rate and regular rhythm.     Pulses: Normal pulses.     Heart sounds: Normal heart sounds.  Pulmonary:     Effort: No respiratory distress.     Breath sounds: Normal breath sounds.  Abdominal:     General: Bowel sounds are normal. There is no distension.     Palpations: Abdomen is soft. There is no hepatomegaly or splenomegaly.     Tenderness: There is abdominal tenderness in the right upper quadrant and epigastric area. There is guarding.  Musculoskeletal: Normal range of motion.     Lumbar back: She exhibits pain and spasm.       Back:  Lymphadenopathy:     Head:     Right side of head: No submandibular adenopathy.      Left side of head: No submandibular adenopathy.     Cervical: No cervical adenopathy.  Skin:    General: Skin is warm and dry.  Neurological:     Mental Status: She is alert and oriented to person, place, and time.     Cranial Nerves: No cranial  nerve deficit.     Sensory: No sensory deficit.  Psychiatric:        Speech: Speech normal.      ED Treatments / Results  Labs (all labs ordered are listed, but only abnormal results are displayed) Labs Reviewed  CBC WITH DIFFERENTIAL/PLATELET  COMPREHENSIVE METABOLIC PANEL  LIPASE, BLOOD  URINALYSIS, ROUTINE W REFLEX MICROSCOPIC    EKG None  Radiology No results found.  Procedures Procedures (including critical care time)  Medications Ordered in ED Medications  cyclobenzaprine (FLEXERIL) tablet 10 mg (has no administration in time range)  traMADol (ULTRAM) tablet 100 mg (has no administration in time range)  ondansetron (ZOFRAN) tablet 4 mg (has no administration in time range)  sodium chloride 0.9 % bolus 1,000 mL (1,000 mLs Intravenous New Bag/Given 08/26/18 1747)    Followed by  0.9 %  sodium chloride infusion (has no administration in time range)     Initial Impression / Assessment and Plan / ED Course  I have reviewed the triage vital signs and the nursing notes.  Pertinent labs & imaging results that were available during my care of the patient were reviewed by me and considered in my medical decision making (see chart for details).          Final Clinical Impressions(s) / ED Diagnoses mdm  Vital signs reviewed.  Pulse oximetry is 100% on room air.  Within normal limits by my interpretation.  Patient complains of lower back pain.  There is no evidence of cauda equina or other emergent changes.  No numbness or tingling in the saddle area.  No recent injury or trauma to the lower back.  No recent operations or procedures.  Patient has a history of degenerative disc disease problems.  Patient treated with  Flexeril and Ultram.  Patient complains of liver pain.  Patient states that she has a history of some liver issues.  She had alcohol this weekend and thinks that this may have set off her abdomen pain.  Patient treated with anti-medic and Ultram.  Labs pending.  Complete blood count is nonacute.  The comprehensive metabolic panel shows potassium to be low at 3.  Oral potassium given.  The AST is elevated at 498, the ALT is elevated at 151, and the total bilirubin is elevated at 3.  Will obtain an outpatient ultrasound for further evaluation of the right upper quadrant.  The anion gap is normal at 10.  Lipase is normal at 33.  The urine analysis shows a amber specimen with a specific gravity of 1.026.  Moderate bilirubin present.  Moderate blood present.  Small leukocyte esterase noted, 11-20 WBCs noted.  Culture has been sent to the lab.  Patient will be started on Keflex 4 times daily.  Patient states pain is improving after medications.  I have reviewed the findings with the patient, and the need for an ultrasound.  Will return to the radiology suite on tomorrow for the ultrasound testing.  I also discussed with the patient the importance of abstaining from all alcohol beverages.  Patient acknowledges understanding of the instructions.   Final diagnoses:  Elevated liver enzymes  Hypokalemia  Lower urinary tract infectious disease    ED Discharge Orders         Ordered    US Abdomen Limited RUQ/Gall Gladder     08/26/18 1926    cephALEXin (KEFLEX) 500 MG capsule  4 times daily,   Status:  Discontinued     08/26/18 1953  traMADol (ULTRAM) 50 MG tablet  Every 6 hours PRN     08/26/18 1953    promethazine (PHENERGAN) 12.5 MG tablet  Every 6 hours PRN     08/26/18 1953    cyclobenzaprine (FLEXERIL) 10 MG tablet  3 times daily     08/26/18 1953    cephALEXin (KEFLEX) 500 MG capsule  4 times daily     08/26/18 1954           Ivery QualeBryant, Kalven Ganim, PA-C 08/26/18 2000    Long, Arlyss RepressJoshua G, MD  08/27/18 1003

## 2018-08-26 NOTE — ED Triage Notes (Signed)
Pt presents to ED with complaints of mid back pain which started yesterday. Pt states it is shooting pains. Pt denies urinary symptoms.

## 2018-08-28 LAB — URINE CULTURE
Culture: <10000 — AB
Special Requests: NORMAL

## 2019-02-23 ENCOUNTER — Encounter: Payer: Self-pay | Admitting: Internal Medicine

## 2019-03-10 ENCOUNTER — Other Ambulatory Visit: Payer: Self-pay

## 2019-03-10 ENCOUNTER — Telehealth: Payer: Self-pay

## 2019-03-10 ENCOUNTER — Encounter: Payer: Self-pay | Admitting: Nurse Practitioner

## 2019-03-10 ENCOUNTER — Ambulatory Visit (INDEPENDENT_AMBULATORY_CARE_PROVIDER_SITE_OTHER): Payer: Medicaid Other | Admitting: Nurse Practitioner

## 2019-03-10 DIAGNOSIS — R768 Other specified abnormal immunological findings in serum: Secondary | ICD-10-CM

## 2019-03-10 DIAGNOSIS — R634 Abnormal weight loss: Secondary | ICD-10-CM | POA: Diagnosis not present

## 2019-03-10 DIAGNOSIS — F1020 Alcohol dependence, uncomplicated: Secondary | ICD-10-CM | POA: Diagnosis not present

## 2019-03-10 NOTE — Telephone Encounter (Signed)
PA for [place] abd submitted via [employer]. Case approved. PA# [id number], valid 03/10/19-09/06/19.

## 2019-03-10 NOTE — Patient Instructions (Signed)
Your health issues we discussed today were:   Hepatitis C: 1. Complete your labs as soon as you can to help decide what treatment options are 2. Have your ultrasound completed this scheduled, this will help [place] decide what treatment options are hepatitis C or possible 3. Follow-up in 6 to 8 weeks  Alcoholism: 1. As we discussed, it is very important for you to work on reducing the amount of alcohol you drink and eventually quitting drinking altogether 2. Discussed with your primary care about referral to alcohol cessation treatment 3. There are typically options for outpatient treatment or inpatient treatment 4. Even if you are able to treat your hepatitis C virus, you are at risk for significant liver disease because of your ongoing alcohol use 5. Call us if you have any problems discussing alcohol treatment  Weight loss: 1. As we discussed, most people with hepatitis C generally do not have any symptoms 2. Your weight loss and poor appetite are more likely due to chronic, ongoing alcohol intake rather than hepatitis C 3. Monitor your weight and we will reevaluate at your follow-up visit 4. It is important to "to live" rather than "living to eat".  Even if you do not have a great appetite, you should try to eat to maintain good nutrition.  Overall I recommend:  1. Continue your other medications 2. Return for follow-up in 6 to 8 weeks 3. Call us if you have any questions or concerns.   Because of recent events of COVID-19 ("Coronavirus"), follow CDC recommendations:  1. Wash your hand frequently 2. Avoid touching your face 3. Stay away from people who are sick 4. If you have symptoms such as fever, cough, shortness of breath then call your healthcare provider for further guidance 5. If you are sick, STAY AT HOME unless otherwise directed by your healthcare provider. 6. Follow directions from state and national officials regarding staying safe   At [facility] we  value your feedback. You may receive a survey about your visit today. Please share your experience as we strive to create trusting relationships with our patients to provide genuine, compassionate, quality care.  We appreciate your understanding and patience as we review any laboratory studies, imaging, and other diagnostic tests that are ordered as we care for you. Our office policy is 5 business days for review of these results, and any emergent or urgent results are addressed in a timely manner for your best interest. If you do not hear from our office in 1 week, please contact us.   We also encourage the use of MyChart, which contains your medical information for your review as well. If you are not enrolled in this feature, an access code is on this after visit summary for your convenience. Thank you for allowing [place] to be involved in your care.  It was great to see you today!  I hope you have a Merry Christmas and Happy Holidays!!

## 2019-03-10 NOTE — Telephone Encounter (Signed)
Pt called office, informed of appt. Letter mailed. 

## 2019-03-10 NOTE — Telephone Encounter (Signed)
[place] abd complete w/elastography scheduled for 03/24/19 at 9:30am, arrive at 9:15am. NPO after midnight prior to test.   Tried to call pt, no answer, left message with a female for her to call office.

## 2019-03-10 NOTE — Assessment & Plan Note (Signed)
The patient describes a 20 pound weight loss in the past couple years.  She states within the past 6 months she has lost weight at work tolerated rate.  States she weighed 115 in January.  Objectively, she weighed about 95 pounds at the beginning of the year and has lost 3 pounds in the past year.  She states she is also because she has poor appetite.  She will eat a few bites lose her appetite.  She was wondering if this is related to her hepatitis C.  Generally, hepatitis C is asymptomatic.  I feel that her decreased appetite and weight loss is more likely related to her chronic alcoholism and persistent alcohol intake.  Discussed need for alcohol cessation.  Discussed need for treatment of alcoholism, regardless of the status of her hepatitis C and liver.  She verbalized understanding.  Continue to monitor weight.  Follow-up in 6 to 8 weeks.

## 2019-03-10 NOTE — Assessment & Plan Note (Signed)
Ongoing alcoholism as defined by 22 ounces of "4 Loco "a day with attempts to quit and unable to quit.  She did undergo court mandated alcohol treatment about 3 years ago.  She continues to drink.  She states she wants to quit drinking but cannot.  States her primary care told her she needs to have her liver evaluated before she can be referred for treatment.  I think at this point she should be referred to alcohol cessation, regardless of where she is on her liver treatment/hepatitis C treatment.  I have strongly encouraged her to seek out treatment through referral either for inpatient or outpatient.  She verbalized understanding.  Even if we are able to treat her hepatitis C, she is at risk for worsening liver disease due to alcoholism.

## 2019-03-10 NOTE — Progress Notes (Signed)
Referring Provider: Kela MillinBarrino, Alethea Y, MD Primary Care Physician:  Kela MillinBarrino, Alethea Y, MD Primary GI:  Dr.   NOTE: Service was provided via telemedicine and was requested by the patient due to COVID-19 pandemic.  Method of visit: Doxy.Me  Patient Location: Set designerCar, parked  Provider Location: Office  Reason for Phone Visit: Evaluation/New Patient Referral  The patient was consented to phone follow-up via telephone encounter including billing of the encounter (yes/no): Yes  Persons present on the phone encounter, with roles: None  Total time (minutes) spent on medical discussion: 26 minutes  Chief Complaint  Patient presents with  . Hepatitis C  . Weight Loss    20 lbs     HPI:   Hannah Rhodes is a 38 y.o. female who presents for virtual visit regarding: Hepatitis C and Weight loss. Reviewed information provided with referral including [facility] dated 02/10/2019.  She noted [facility] for abdominal pain with loss.  Noted hepatitis C antibody positive at that time.  Initially referred to GI in January 2020 for hepatitis C but was unable to go due to transportation.  Noted that she smokes half pack a day.  Previous cocaine use and positive for cocaine on UDS in 2018 in the emergency department.  Objective weights between January 2020 and November 2020 shows overall down about 3 pounds. (95 lb 03/31/18 and 92 lb 02/10/19).  At that time recommended repeat hepatitis C viral load, refer to GI, rule out other causes of weight loss such as thyroid diabetes with subjective causes untreated hepatitis C.  She had labs drawn associated with that visit including hepatitis C viral load which was found to be 5.9 million copies (on 02/25/19).  No genotype completed.  No history of colonoscopy or endoscopy in our system.  There was an acute hepatitis panel completed 01/16/2018 which was positive for hepatitis C antibody, negative for hepatitis A IgM  and negative for hepatitis B core IgM and hepatitis B surface antigen.  No HIV testing completed in our system.  Most recent CMP completed 08/26/2018 which found significantly elevated AST/ALT of 498/151 (previously appears to be elevated at 72/30).  Alkaline phosphatase at that time was normal at 110, total bilirubin elevated at 3.0 although is typically normal from 1.2-1.4.  Concerningly, 1 year ago her AST/ALT was 821/444.  Reviewed ER visit dated 01/16/2018 for elevated liver enzymes.  At that time the patient presented with complaints of abdominal pain for 3 days.  It was at this point that her AST/ALT was significantly elevated 821/444 with alkaline phosphatase of 210.  CT scan was completed which found no focal liver abnormality, no gallstones or gallbladder wall thickening, no biliary dilation.  Did note involuting left ovarian cyst, no other abnormalities.  The patient was discharged to home with recommended primary care follow-up.  Of note a previous right upper quadrant ultrasound completed 03/22/2017 found normal liver, CBD at 3 mm without dilation, fatty liver.  Today she states she's not doing well, hasn't eaten in 5 days. Her appetite has been bad for about 5 days. She will be hungry, takes 3 bites and then feels if she eats anymore she'll make herself sick. She's lost weight, subjectively 20 lbs over 3 years. States she's lost more recently (over the last 6 months). States she was about 115 lb at the beginning of the year. Objectively weight overall seems stable since beginning of the year, as per above.. Just doesn't  feel hungry. Denies abdominal pain. Has N/V when she drinks. She drinks 22 oz 4-Loco almost every night. She has tried to quit drinking, but she "can't". She's gone to mental health in [place], which was mandatory because she almost lost her children about 3 years ago. She has talked to her PCP about ETOH treatment but doesn't want to refer her until her "liver is figured out."  Denies hematochezia, melena, fever. She does have "cold chills, I get cold and hot on and off."   Hepatitis C Risk Factors:  Birth cohort ([place]): No IV drug use: Yes, not currently Tattoos: Yes, tattoo parlor; two were done "at home" Blood product transfusion: Yes, 2012 (not old enough to be high risk) HC worker: No Hemodialysis: No Maternal infection: No   Hepatitis C Treatment Checklist  HCV RNA: 322,470 copies HCV GT: ordered Elastography: ordered Tx history: No Hepatitis A serologies: ordered Hepatitis B serologies: ordered HIV Serologies: ordered Contraindicated Medications: None at this time Contraception: S/P tubal ligation  Past Medical History:  Diagnosis Date  . Anxiety   . Chronic hepatitis C ([place])   . Generalized headaches   . HPV (human papilloma virus) infection   . Hypertension   . Panic attack   . Tachycardia     Past Surgical History:  Procedure Laterality Date  . CESAREAN SECTION    . CESAREAN SECTION    . SMALL INTESTINE SURGERY      Current Outpatient Medications  Medication Sig Dispense Refill  . amLODipine (NORVASC) 5 MG tablet Take 1 tablet (5 mg total) by mouth daily. 180 tablet 3  . metoprolol tartrate (LOPRESSOR) 50 MG tablet Take 1 tablet (50 mg total) by mouth 2 (two) times daily. 180 tablet 3  . nicotine (NICODERM CQ - DOSED IN MG/24 HOURS) 21 mg/24hr patch Place 21 mg onto the skin daily.     No current facility-administered medications for this visit.    Allergies as of 03/10/2019 - Review Complete 03/10/2019  Allergen Reaction Noted  . Adhesive [tape]  03/10/2019    Family History  Problem Relation Age of Onset  . Anxiety disorder Sister   . Restless legs syndrome Sister   . Heart failure Mother   . CAD Mother   . Heart attack Mother   . Colon cancer Neg Hx     Social History   Socioeconomic History  . Marital status: Single    Spouse name: Not on file  . Number of children: Not on file  . Years of  education: Not on file  . Highest education level: Not on file  Occupational History  . Not on file  Tobacco Use  . Smoking status: Current Every Day Smoker    Packs/day: 0.50  . Smokeless tobacco: Never Used  Substance and Sexual Activity  . Alcohol use: Yes    Comment: 22 ounces of "4 lokos" nightly  . Drug use: Not Currently    Types: Marijuana, Heroin, Oxycodone, Hydrocodone    Comment: "only tried heroine twice"  . Sexual activity: Never  Other Topics Concern  . Not on file  Social History Narrative  . Not on file   Social Determinants of Health   Financial Resource Strain:   . Difficulty of Paying Living Expenses: Not on file  Food Insecurity:   . Worried About Charity fundraiser in the Last Year: Not on file  . Ran Out of Food in the Last Year: Not on file  Transportation Needs:   .  Lack of Transportation (Medical): Not on file  . Lack of Transportation (Non-Medical): Not on file  Physical Activity:   . Days of Exercise per Week: Not on file  . Minutes of Exercise per Session: Not on file  Stress:   . Feeling of Stress : Not on file  Social Connections:   . Frequency of Communication with Friends and Family: Not on file  . Frequency of Social Gatherings with Friends and Family: Not on file  . Attends Religious Services: Not on file  . Active Member of Clubs or Organizations: Not on file  . Attends Banker Meetings: Not on file  . Marital Status: Not on file    Review of Systems: General: Negative for anorexia, weight loss, fever, chills, fatigue, weakness. Eyes: Negative for vision changes.  ENT: Negative for hoarseness, difficulty swallowing , nasal congestion. CV: Negative for chest pain, angina, palpitations, dyspnea on exertion, peripheral edema.  Respiratory: Negative for dyspnea at rest, dyspnea on exertion, cough, sputum, wheezing.  GI: See history of present illness. GU:  Negative for dysuria, hematuria, urinary incontinence, urinary  frequency, nocturnal urination.  MS: Negative for joint pain, low back pain.  Derm: Negative for rash or itching.  Neuro: Negative for weakness, abnormal sensation, seizure, frequent headaches, memory loss, confusion.  Psych: Negative for anxiety, depression, suicidal ideation, hallucinations.  Endo: Negative for unusual weight change.  Heme: Negative for bruising or bleeding. Allergy: Negative for rash or hives.  Physical Exam: Note: limited exam due to virtual visit General:   Alert and oriented. Pleasant and cooperative. Well-nourished and well-developed.  Head:  Normocephalic and atraumatic. Eyes:  Without icterus, sclera clear and conjunctiva pink.  Ears:  Normal auditory acuity. Skin:  Intact without facial significant lesions or rashes. Neurologic:  Alert and oriented x4;  grossly normal neurologically. Psych:  Alert and cooperative. Normal mood and affect. Heme/Lymph/Immune: No excessive bruising noted.

## 2019-03-10 NOTE — Assessment & Plan Note (Signed)
Positive hepatitis C antibody and RNA confirmation twice in the past 2 years.  Most recently as per HPI.  She likely does have hepatitis C and we will need to complete further serologic and imaging work-up to assess what treatment options may be possible.  Overall, I would like her to at least begin reducing her alcohol intake before starting treatment.  However, there is utility for hepatitis C treatment to prevent acceleration of liver disease and the presence of alcohol and subsequently alcohol cessation is not a complete necessity prior to treatment.  We discussed treatment options including inpatient versus outpatient.  At this point I will proceed with labs including CBC, CMP, hepatitis C genotype, HIV, hepatitis B serologies, hepatitis B serologies.  Also check lower quadrant ultrasound with elastography.  Follow-up in 6 to 8 weeks to discuss options.  Of note, she has had a tubal ligation and is not on contraception currently.

## 2019-03-11 ENCOUNTER — Encounter: Payer: Self-pay | Admitting: Internal Medicine

## 2019-03-15 ENCOUNTER — Emergency Department (HOSPITAL_COMMUNITY): Payer: Medicaid Other

## 2019-03-15 ENCOUNTER — Other Ambulatory Visit: Payer: Self-pay

## 2019-03-15 ENCOUNTER — Emergency Department (HOSPITAL_COMMUNITY)
Admission: EM | Admit: 2019-03-15 | Discharge: 2019-03-16 | Disposition: A | Discharge status: Home / Self Care | Payer: Medicaid Other | Attending: Emergency Medicine | Admitting: Emergency Medicine

## 2019-03-15 ENCOUNTER — Encounter (HOSPITAL_COMMUNITY): Payer: Self-pay

## 2019-03-15 DIAGNOSIS — I10 Essential (primary) hypertension: Secondary | ICD-10-CM | POA: Diagnosis not present

## 2019-03-15 DIAGNOSIS — R0602 Shortness of breath: Secondary | ICD-10-CM | POA: Diagnosis present

## 2019-03-15 DIAGNOSIS — J449 Chronic obstructive pulmonary disease, unspecified: Secondary | ICD-10-CM | POA: Diagnosis not present

## 2019-03-15 DIAGNOSIS — F17210 Nicotine dependence, cigarettes, uncomplicated: Secondary | ICD-10-CM | POA: Insufficient documentation

## 2019-03-15 DIAGNOSIS — Z79899 Other long term (current) drug therapy: Secondary | ICD-10-CM | POA: Insufficient documentation

## 2019-03-15 DIAGNOSIS — R0789 Other chest pain: Secondary | ICD-10-CM | POA: Insufficient documentation

## 2019-03-15 HISTORY — DX: Chronic obstructive pulmonary disease, unspecified: J44.9

## 2019-03-15 LAB — COMPREHENSIVE METABOLIC PANEL
ALT: 63 U/L — ABNORMAL HIGH (ref 0–44)
AST: 231 U/L — ABNORMAL HIGH (ref 15–41)
Albumin: 3.3 g/dL — ABNORMAL LOW (ref 3.5–5.0)
Alkaline Phosphatase: 150 U/L — ABNORMAL HIGH (ref 38–126)
Anion gap: 11 (ref 5–15)
BUN: 5 mg/dL — ABNORMAL LOW (ref 6–20)
CO2: 26 mmol/L (ref 22–32)
Calcium: 8.2 mg/dL — ABNORMAL LOW (ref 8.9–10.3)
Chloride: 97 mmol/L — ABNORMAL LOW (ref 98–111)
Creatinine, Ser: 0.52 mg/dL (ref 0.44–1.00)
GFR calc Af Amer: >60 mL/min (ref >60)
GFR calc non Af Amer: >60 mL/min (ref >60)
Glucose, Bld: 97 mg/dL (ref 70–99)
Potassium: 3.9 mmol/L (ref 3.5–5.1)
Sodium: 134 mmol/L — ABNORMAL LOW (ref 135–145)
Total Bilirubin: 1.6 mg/dL — ABNORMAL HIGH (ref 0.3–1.2)
Total Protein: 6.4 g/dL — ABNORMAL LOW (ref 6.5–8.1)

## 2019-03-15 LAB — CBC WITH DIFFERENTIAL/PLATELET
Abs Immature Granulocytes: 0.02 10*3/uL (ref 0.00–0.07)
Basophils Absolute: 0.1 10*3/uL (ref 0.0–0.1)
Basophils Relative: 1 %
Eosinophils Absolute: 0 10*3/uL (ref 0.0–0.5)
Eosinophils Relative: 1 %
HCT: 34.5 % — ABNORMAL LOW (ref 36.0–46.0)
Hemoglobin: 11.9 g/dL — ABNORMAL LOW (ref 12.0–15.0)
Immature Granulocytes: 0 %
Lymphocytes Relative: 47 %
Lymphs Abs: 2.6 10*3/uL (ref 0.7–4.0)
MCH: 33.9 pg (ref 26.0–34.0)
MCHC: 34.5 g/dL (ref 30.0–36.0)
MCV: 98.3 fL (ref 80.0–100.0)
Monocytes Absolute: 0.3 10*3/uL (ref 0.1–1.0)
Monocytes Relative: 6 %
Neutro Abs: 2.5 10*3/uL (ref 1.7–7.7)
Neutrophils Relative %: 45 %
Platelets: 214 10*3/uL (ref 150–400)
RBC: 3.51 MIL/uL — ABNORMAL LOW (ref 3.87–5.11)
RDW: 16 % — ABNORMAL HIGH (ref 11.5–15.5)
WBC: 5.6 10*3/uL (ref 4.0–10.5)
nRBC: 0 % (ref 0.0–0.2)

## 2019-03-15 LAB — TROPONIN I (HIGH SENSITIVITY): Troponin I (High Sensitivity): <2 ng/L (ref <18)

## 2019-03-15 NOTE — ED Triage Notes (Signed)
Pt presents to ED with complaints of SOB and chest tightness x 1 hour. Pt denies fever or cough.

## 2019-03-15 NOTE — ED Provider Notes (Signed)
[facility] EMERGENCY DEPARTMENT Provider Note   CSN: [id number] Arrival date & time: 03/15/19  2140     History Chief Complaint  Patient presents with  . Shortness of Breath    Hannah Rhodes is a 38 y.o. female with a past medical history significant for anxiety, chronic hepatitis C, COPD, hypertension, and panic attacks who presents to the ED due to sudden onset of central, nonradiating, intermittent chest pressure associated with shortness of breath for the past hour.  Patient states she was cooking dinner when the pain began.  Patient admits to having previous episodes of chest pain in the past, but states this chest pain is different.  Patient notes her chest pain is worse when lying flat.  Patient denies any alleviating factors.  Patient smokes half a pack of cigarettes a day.  Patient denies recent illness, fever and chills.  Patient also admits to bilateral hand numbness and tingling.  Patient denies feeling anxious during onset of chest pain.  Patient has a family history of early CAD on her mother's side. Patient denies lower extremity edema, hormone use, recent surgeries, recent long immobilizations, and history of blood clots.  Patient denies abdominal pain, nausea, vomiting, and diarrhea.  Past Medical History:  Diagnosis Date  . Anxiety   . Chronic hepatitis C (HCC)   . COPD (chronic obstructive pulmonary disease) (HCC)   . Generalized headaches   . HPV (human papilloma virus) infection   . Hypertension   . Panic attack   . Tachycardia     Patient Active Problem List   Diagnosis Date Noted  . Hepatitis C antibody positive in blood 03/10/2019  . Alcoholism (H[facility]) 03/10/2019  . Loss of weight 03/10/2019  . DYSPNEA 10/24/2009  . ANXIETY STATE, UNSPECIFIED 10/21/2009  . NONDEPENDENT TOBACCO USE DISORDER 10/21/2009  . INSOMNIA UNSPECIFIED 10/21/2009  . PALPITATIONS 10/21/2009    Past Surgical History:  Procedure Laterality Date  . CESAREAN SECTION    . CESAREAN  SECTION    . SMALL INTESTINE SURGERY       OB History   No obstetric history on file.     Family History  Problem Relation Age of Onset  . Anxiety disorder Sister   . Restless legs syndrome Sister   . Heart failure Mother   . CAD Mother   . Heart attack Mother   . Colon cancer Neg Hx     Social History   Tobacco Use  . Smoking status: Current Every Day Smoker    Packs/day: 0.50  . Smokeless tobacco: Never Used  Substance Use Topics  . Alcohol use: Not Currently    Comment: 22 ounces of "4 lokos" nightly  . Drug use: Not Currently    Types: Marijuana, Heroin, Oxycodone, Hydrocodone    Comment: "only tried heroine twice"    Home Medications Prior to Admission medications   Medication Sig Start Date End Date Taking? Authorizing Provider  amLODipine (NORVASC) 10 MG tablet Take 10 mg by mouth daily.   Yes [provider]  metoprolol tartrate (LOPRESSOR) 50 MG tablet Take 1 tablet (50 mg total) by mouth 2 (two) times daily. 12/27/16 03/15/19 Yes Laqueta Linden, MD  nicotine (NICODERM CQ - DOSED IN MG/24 HOURS) 21 mg/24hr patch Place 21 mg onto the skin daily.   Yes [provider]    Allergies    Adhesive [tape]  Review of Systems   Review of Systems  Constitutional: Negative for chills and fever.  Respiratory: Positive for shortness  of breath. Negative for cough.   Cardiovascular: Positive for chest pain. Negative for palpitations and leg swelling.  Gastrointestinal: Negative for abdominal pain, diarrhea, nausea and vomiting.  Genitourinary: Negative for dysuria.  Neurological: Positive for numbness (bilateral hands). Negative for weakness and headaches.  All other systems reviewed and are negative.   Physical Exam Updated Vital Signs BP 134/71 (BP Location: Right Arm)   Pulse 65   Temp 98.1 F (36.7 C) (Tympanic)   Resp 13   Ht 4\' 10"  (1.473 m)   Wt 41.7 kg   LMP 03/06/2019   SpO2 96%   BMI 19.23 kg/m   Physical Exam Vitals and  nursing note reviewed.  Constitutional:      General: She is not in acute distress.    Appearance: She is not ill-appearing.  HENT:     Head: Normocephalic.  Eyes:     Conjunctiva/sclera: Conjunctivae normal.     Pupils: Pupils are equal, round, and reactive to light.  Cardiovascular:     Rate and Rhythm: Normal rate and regular rhythm.     Pulses: Normal pulses.     Heart sounds: Normal heart sounds. No murmur. No friction rub. No gallop.   Pulmonary:     Effort: Pulmonary effort is normal.     Breath sounds: Normal breath sounds.     Comments: Respirations equal and unlabored, patient able to speak in full sentences, lungs clear to auscultation bilaterally  Chest:     Comments: Anterior chest wall tenderness. Abdominal:     General: Abdomen is flat. Bowel sounds are normal. There is no distension.     Palpations: Abdomen is soft.     Tenderness: There is no abdominal tenderness. There is no guarding or rebound.  Musculoskeletal:     Cervical back: Neck supple.     Right lower leg: No edema.     Left lower leg: No edema.     Comments: Able to move all 4 extremities without difficulty. No lower extremity edema. Negative homans sign bilaterally.  Skin:    General: Skin is warm.  Neurological:     General: No focal deficit present.     Mental Status: She is alert.     ED Results / Procedures / Treatments   Labs (all labs ordered are listed, but only abnormal results are displayed) Labs Reviewed  CBC WITH DIFFERENTIAL/PLATELET - Abnormal; Notable for the following components:      Result Value   RBC 3.51 (*)    Hemoglobin 11.9 (*)    HCT 34.5 (*)    RDW 16.0 (*)    All other components within normal limits  COMPREHENSIVE METABOLIC PANEL - Abnormal; Notable for the following components:   Sodium 134 (*)    Chloride 97 (*)    BUN 5 (*)    Calcium 8.2 (*)    Total Protein 6.4 (*)    Albumin 3.3 (*)    AST 231 (*)    ALT 63 (*)    Alkaline Phosphatase 150 (*)     Total Bilirubin 1.6 (*)    All other components within normal limits  TROPONIN I (HIGH SENSITIVITY)  TROPONIN I (HIGH SENSITIVITY)    EKG EKG Interpretation  Date/Time:  Sunday March 15 2019 22:00:07 EST Ventricular Rate:  72 PR Interval:    QRS Duration: 72 QT Interval:  425 QTC Calculation: 466 R Axis:   29 Text Interpretation: Sinus rhythm Short PR interval Low voltage, precordial leads No acute changes  No significant change since last tracing Confirmed by Derwood Kaplananavati, Ankit [phone]([id number]) on 03/15/2019 11:57:15 PM   Radiology DG Chest 2 View  Result Date: 03/15/2019 CLINICAL DATA:  Shortness of breath EXAM: CHEST - 2 VIEW COMPARISON:  February 09, 2018 FINDINGS: The heart size and mediastinal contours are within normal limits. Both lungs are clear. The visualized skeletal structures are unremarkable. IMPRESSION: No active cardiopulmonary disease. Electronically Signed   By: Jonna ClarkBindu  Avutu M.D.   On: 03/15/2019 22:28    Procedures Procedures (including critical care time)  Medications Ordered in ED Medications - No data to display  ED Course  I have reviewed the triage vital signs and the nursing notes.  Pertinent labs & imaging results that were available during my care of the patient were reviewed by me and considered in my medical decision making (see chart for details).    MDM Rules/Calculators/A&P                      HEART pathway score: 403.  38 year old female presents to the ED due to sudden onset of central chest tightness associated with shortness of breath for the past 1 hour.  Patient has had previous episodes of chest pain, but she states this feels different.  Stable vitals.  Patient is afebrile with no tachycardia or hypoxia.  Patient in no acute distress and nonill appearing.  Normal heart sounds.  Lungs clear to auscultation bilaterally.  Anterior chest wall tenderness to palpation, the patient states it feels different than the chest pressure she is feeling.   Patient denies recent illness. PERC negative and low risk using Wells Criteria, doubt PE/DVT at this time. Dissection considered, but thought to be less likely given patient's physical exam and HPI.   CBC reassuring with no leukocytosis. Hgb mildly decreased at 11.9. CMP remarkable for hyponatremia at 134, elevated LFTs with AST at 231 and ALT at 63 likely due to chronic hepatitis (better than baseline), and increased bilirubin at 1.6. Initial troponin normal. Will get delta to rule out ACS. EKG personally reviewed which demonstrates sinus rhythm with no signs of ischemia. CXR personally reviewed which is negative for cardiomegaly, signs of infection, PTX, and widened mediastinum.   Patient handed off to Dr. Rhunette CroftNanavati at shift change to follow-up on delta troponin. If delta troponin normal, patient can be discharged home to follow-up with her cardiologist for further workup of her chest pain.  Final Clinical Impression(s) / ED Diagnoses Final diagnoses:  Atypical chest pain    Rx / DC Orders ED Discharge Orders    None       Mannie Stabileberman, Caroline C, PA-C 03/16/19 0850    Bethann BerkshireZammit, Joseph, MD 03/25/19 1028

## 2019-03-16 LAB — TROPONIN I (HIGH SENSITIVITY): Troponin I (High Sensitivity): <2 ng/L (ref <18)

## 2019-03-16 NOTE — Discharge Instructions (Addendum)
As discussed, your labs, chest x-ray, and EKG were reassuring with no signs of a heart attack. Schedule an appointment with your cardiologist for further evaluation of your chest pain. If your chest pain continues, you may take over the counter ibuprofen or tylenol as needed for pain. Return to the ER for new or worsening symptoms.

## 2019-03-24 ENCOUNTER — Other Ambulatory Visit: Payer: Self-pay

## 2019-03-24 ENCOUNTER — Ambulatory Visit (HOSPITAL_COMMUNITY)
Admission: RE | Admit: 2019-03-24 | Discharge: 2019-03-24 | Disposition: A | Discharge status: Home / Self Care | Payer: Medicaid Other | Source: Ambulatory Visit | Attending: Nurse Practitioner | Admitting: Nurse Practitioner

## 2019-03-24 DIAGNOSIS — R768 Other specified abnormal immunological findings in serum: Secondary | ICD-10-CM | POA: Insufficient documentation

## 2019-03-24 DIAGNOSIS — R634 Abnormal weight loss: Secondary | ICD-10-CM | POA: Insufficient documentation

## 2019-03-24 DIAGNOSIS — F1020 Alcohol dependence, uncomplicated: Secondary | ICD-10-CM | POA: Diagnosis present

## 2019-04-07 ENCOUNTER — Telehealth: Payer: Self-pay | Admitting: Cardiovascular Disease

## 2019-04-07 NOTE — Telephone Encounter (Signed)

## 2019-04-09 ENCOUNTER — Telehealth (INDEPENDENT_AMBULATORY_CARE_PROVIDER_SITE_OTHER): Payer: Medicaid Other | Admitting: Cardiovascular Disease

## 2019-04-09 ENCOUNTER — Encounter: Payer: Self-pay | Admitting: Cardiovascular Disease

## 2019-04-09 VITALS — BP 132/91 | HR 101 | Ht <= 58 in | Wt 92.0 lb

## 2019-04-09 DIAGNOSIS — Z720 Tobacco use: Secondary | ICD-10-CM

## 2019-04-09 DIAGNOSIS — R000 Tachycardia, unspecified: Secondary | ICD-10-CM

## 2019-04-09 DIAGNOSIS — R002 Palpitations: Secondary | ICD-10-CM

## 2019-04-09 DIAGNOSIS — R0609 Other forms of dyspnea: Secondary | ICD-10-CM

## 2019-04-09 DIAGNOSIS — I10 Essential (primary) hypertension: Secondary | ICD-10-CM

## 2019-04-09 DIAGNOSIS — Z9289 Personal history of other medical treatment: Secondary | ICD-10-CM

## 2019-04-09 MED ORDER — METOPROLOL TARTRATE 50 MG PO TABS: 75.0000 mg | ORAL_TABLET | Freq: Two times a day (BID) | ORAL | 3 refills | Status: DC

## 2019-04-09 NOTE — Progress Notes (Signed)
Virtual Visit via Telephone Note   This visit type was conducted due to national recommendations for restrictions regarding the COVID-19 Pandemic (e.g. social distancing) in an effort to limit this patient's exposure and mitigate transmission in our community.  Due to her co-morbid illnesses, this patient is at least at moderate risk for complications without adequate follow up.  This format is felt to be most appropriate for this patient at this time.  The patient did not have access to video technology/had technical difficulties with video requiring transitioning to audio format only (telephone).  All issues noted in this document were discussed and addressed.  No physical exam could be performed with this format.  Please refer to the patient's chart for her  consent to telehealth for [facility].   Date:  04/09/2019   ID:  Hannah Rhodes, DOB 02-23-1981, MRN [id number]  Patient Location: Home Provider Location: Office  PCP:  Kela Millin, MD  Cardiologist:  No primary care provider on file.  Electrophysiologist:  None   Evaluation Performed:  Follow-Up Visit  Chief Complaint: Chest pain and dyspnea  History of Present Illness:    Hannah Rhodes is a 39 y.o. female with a history of chest pain and dyspnea as well as anxiety and panic attacks.  She underwent a low risk exercise tolerance test on 01/10/17 and exercised for 9 minutes.  She was again evaluated in the ED on 03/15/2019 for shortness of breath and intermittent chest pressure.  There was reportedly tenderness to palpation of the chest wall.  High-sensitivity troponins were normal.  AST was elevated to 231.  ALT was elevated to 63.  Alkaline phosphatase was elevated 150 and total bilirubin was elevated 1.6.  Chest x-ray showed no active cardiopulmonary disease.  ECG performed on 03/15/2019 which I personally reviewed demonstrated sinus rhythm with no ischemic ST segment or T wave abnormalities, nor any arrhythmias.  She denies chest pain. HR fluctuates.  Sometimes when she is carrying out her routine activities of daily living she has to stop to catch her breath and allow her heart rate to get down to normal.  She denies fevers and chills.  She denies leg swelling and PND.   Past Medical History:  Diagnosis Date  . Anxiety   . Chronic hepatitis C (HCC)   . COPD (chronic obstructive pulmonary disease) (HCC)   . Generalized headaches   . HPV (human papilloma virus) infection   . Hypertension   . Panic attack   . Tachycardia    Past Surgical History:  Procedure Laterality Date  . CESAREAN SECTION    . CESAREAN SECTION    . SMALL INTESTINE SURGERY       Current Meds  Medication Sig  . amLODipine (NORVASC) 10 MG tablet Take 10 mg by mouth daily.  . metoprolol tartrate (LOPRESSOR) 50 MG tablet Take 1 tablet (50 mg total) by mouth 2 (two) times daily.  . nicotine (NICODERM CQ - DOSED IN MG/24 HOURS) 21 mg/24hr patch Place 21 mg onto the skin daily.  Marland Kitchen venlafaxine XR (EFFEXOR-XR) 37.5 MG 24 hr capsule Take by mouth.     Allergies:   Adhesive [tape]   Social History   Tobacco Use  . Smoking status: Current Every Day Smoker    Packs/day: 0.50  . Smokeless tobacco: Never Used  Substance Use Topics  . Alcohol use: Not Currently    Comment: 22 ounces of "4 lokos" nightly  . Drug use: Not Currently    Types: Marijuana,  Heroin, Oxycodone, Hydrocodone    Comment: "only tried heroine twice"     Family Hx: The patient's family history includes Anxiety disorder in her sister; CAD in her mother; Heart attack in her mother; Heart failure in her mother; Restless legs syndrome in her sister. There is no history of Colon cancer.  ROS:   Please see the history of present illness.     All other systems reviewed and are negative.   Prior CV studies:   The following studies were reviewed today:  Reviewed above  Labs/Other Tests and Data Reviewed:    EKG:  No ECG reviewed.  Recent Labs:  03/15/2019: ALT 63; BUN 5; Creatinine, Ser 0.52; Hemoglobin 11.9; Platelets 214; Potassium 3.9; Sodium 134   Recent Lipid Panel No results found for: CHOL, TRIG, HDL, CHOLHDL, LDLCALC, LDLDIRECT  Wt Readings from Last 3 Encounters:  04/09/19 92 lb (41.7 kg)  03/15/19 92 lb (41.7 kg)  08/26/18 97 lb (44 kg)     Objective:    Vital Signs:  BP (!) 132/91   Pulse (!) 101   Ht 4\' 10"  (1.473 m)   Wt 92 lb (41.7 kg)   BMI 19.23 kg/m    VITAL SIGNS:  reviewed  ASSESSMENT & PLAN:    1. Tachycardia and palpitations:  Currently experiencing some exertional dyspnea with metoprolol 50 mg twice daily due to tachycardia.  I will increase the dose to 75 mg twice daily. 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 Her tachycardia may be the result of neurologic damage suffered duringa coma6 years ago.  2. Exertional dyspnea and chest pain:  Currently denies chest pain.  She underwent a low risk exercise tolerance test on 01/10/17 and exercised for 9 minutes. No further cardiac testing is indicated. I suspect some component of her exertional dyspnea is due to emphysema from longterm tobacco use. We previously discussed tobacco cessation.  She is currently using nicotine patches.  3. Tobacco use disorder: She smokesahalf pack of cigarettes daily and has done so for over 20 years. Cessation counseling has been provided in the past. She's currently using nicotine patches.  4. Hypertension: Diastolic blood pressure is mildly elevated.  I am increasing Lopressor to 75 mg twice daily.    COVID-19 Education: The signs and symptoms of COVID-19 were discussed with the patient and how to seek care for testing (follow up with PCP or arrange E-visit).  The importance of social distancing was discussed today.  Time:   Today, I have spent 15 minutes with the patient with telehealth technology discussing the above problems.     Medication Adjustments/Labs and Tests Ordered: Current  medicines are reviewed at length with the patient today.  Concerns regarding medicines are outlined above.   Tests Ordered: No orders of the defined types were placed in this encounter.   Medication Changes: No orders of the defined types were placed in this encounter.   Follow Up:  Virtual Visit  in 1 year(s)  Signed, Kate Sable, MD  04/09/2019 12:22 PM    [facility]

## 2019-04-09 NOTE — Patient Instructions (Signed)
Medication Instructions:  INCREASE Lopressor to 75 mg twice a day   *If you need a refill on your cardiac medications before your next appointment, please call your pharmacy*  Lab Work: NONE If you have labs (blood work) drawn today and your tests are completely normal, you will receive your results only by: Marland Kitchen MyChart Message (if you have MyChart) OR . A paper copy in the mail If you have any lab test that is abnormal or we need to change your treatment, we will call you to review the results.  Testing/Procedures: NONE  Follow-Up: At [facility], you and your health needs are our priority.  As part of our continuing mission to provide you with exceptional heart care, we have created designated Provider Care Teams.  These Care Teams include your primary Cardiologist (physician) and Advanced Practice Providers (APPs -  Physician Assistants and Nurse Practitioners) who all work together to provide you with the care you need, when you need it.  Your next appointment:   12 month(s)  The format for your next appointment:   Virtual Visit   Provider:   Prentice Docker, MD  Other Instructions NONE      Thank you for choosing [facility] !

## 2019-04-09 NOTE — Addendum Note (Signed)
Addended by: Marlyn Corporal A on: 04/09/2019 12:40 PM   Modules accepted: Orders

## 2019-04-10 ENCOUNTER — Other Ambulatory Visit: Payer: Self-pay | Admitting: Nurse Practitioner

## 2019-04-10 DIAGNOSIS — R768 Other specified abnormal immunological findings in serum: Secondary | ICD-10-CM

## 2019-04-10 NOTE — Progress Notes (Signed)
Add on for HCV gt.  Please mail lab slip to patient.

## 2019-04-13 NOTE — Progress Notes (Signed)
Noted, lab orders mailed to pt.

## 2019-05-01 IMAGING — DX DG CHEST 2V
2 series · 2 of 2 positions shown · non-contrast
Comparison: 08/30/2017

CLINICAL DATA: Chest pain, dizziness, and shortness of breath.

EXAM:
CHEST - 2 VIEW

[chest pa]
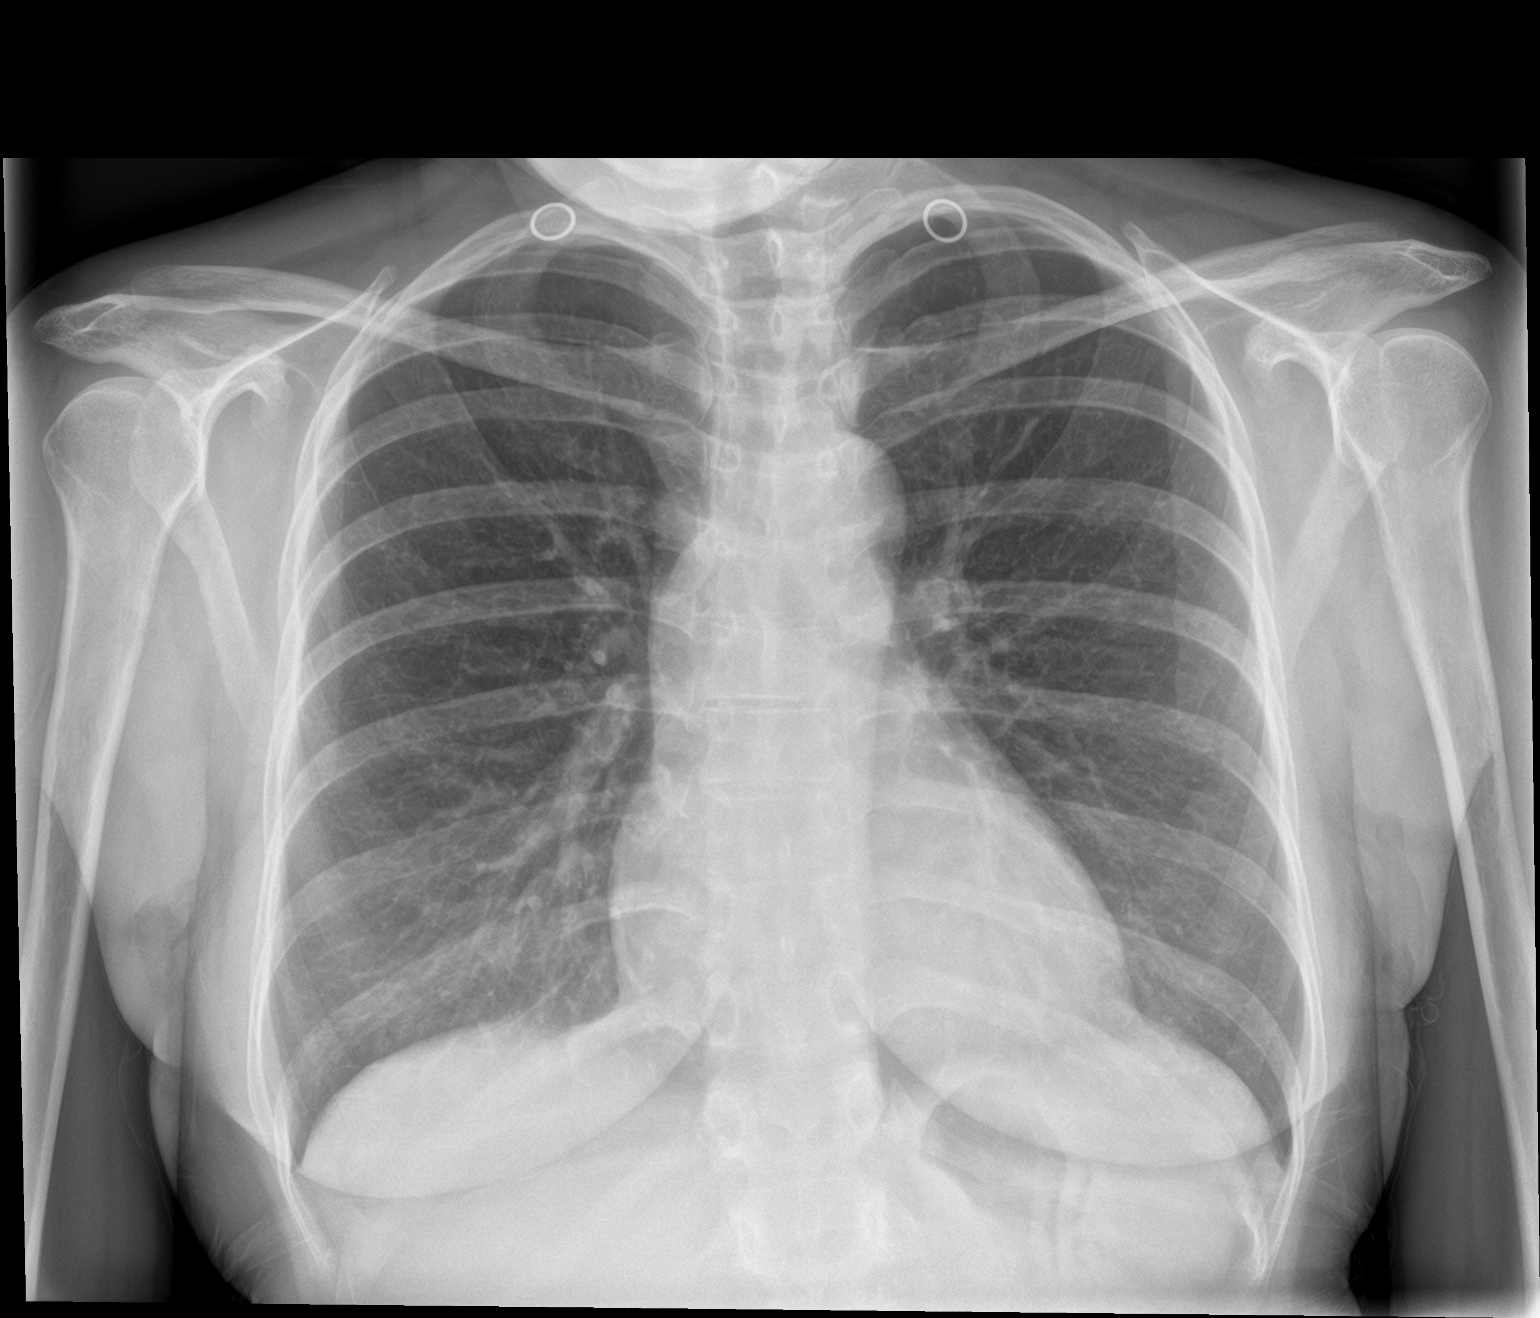

[chest lat]
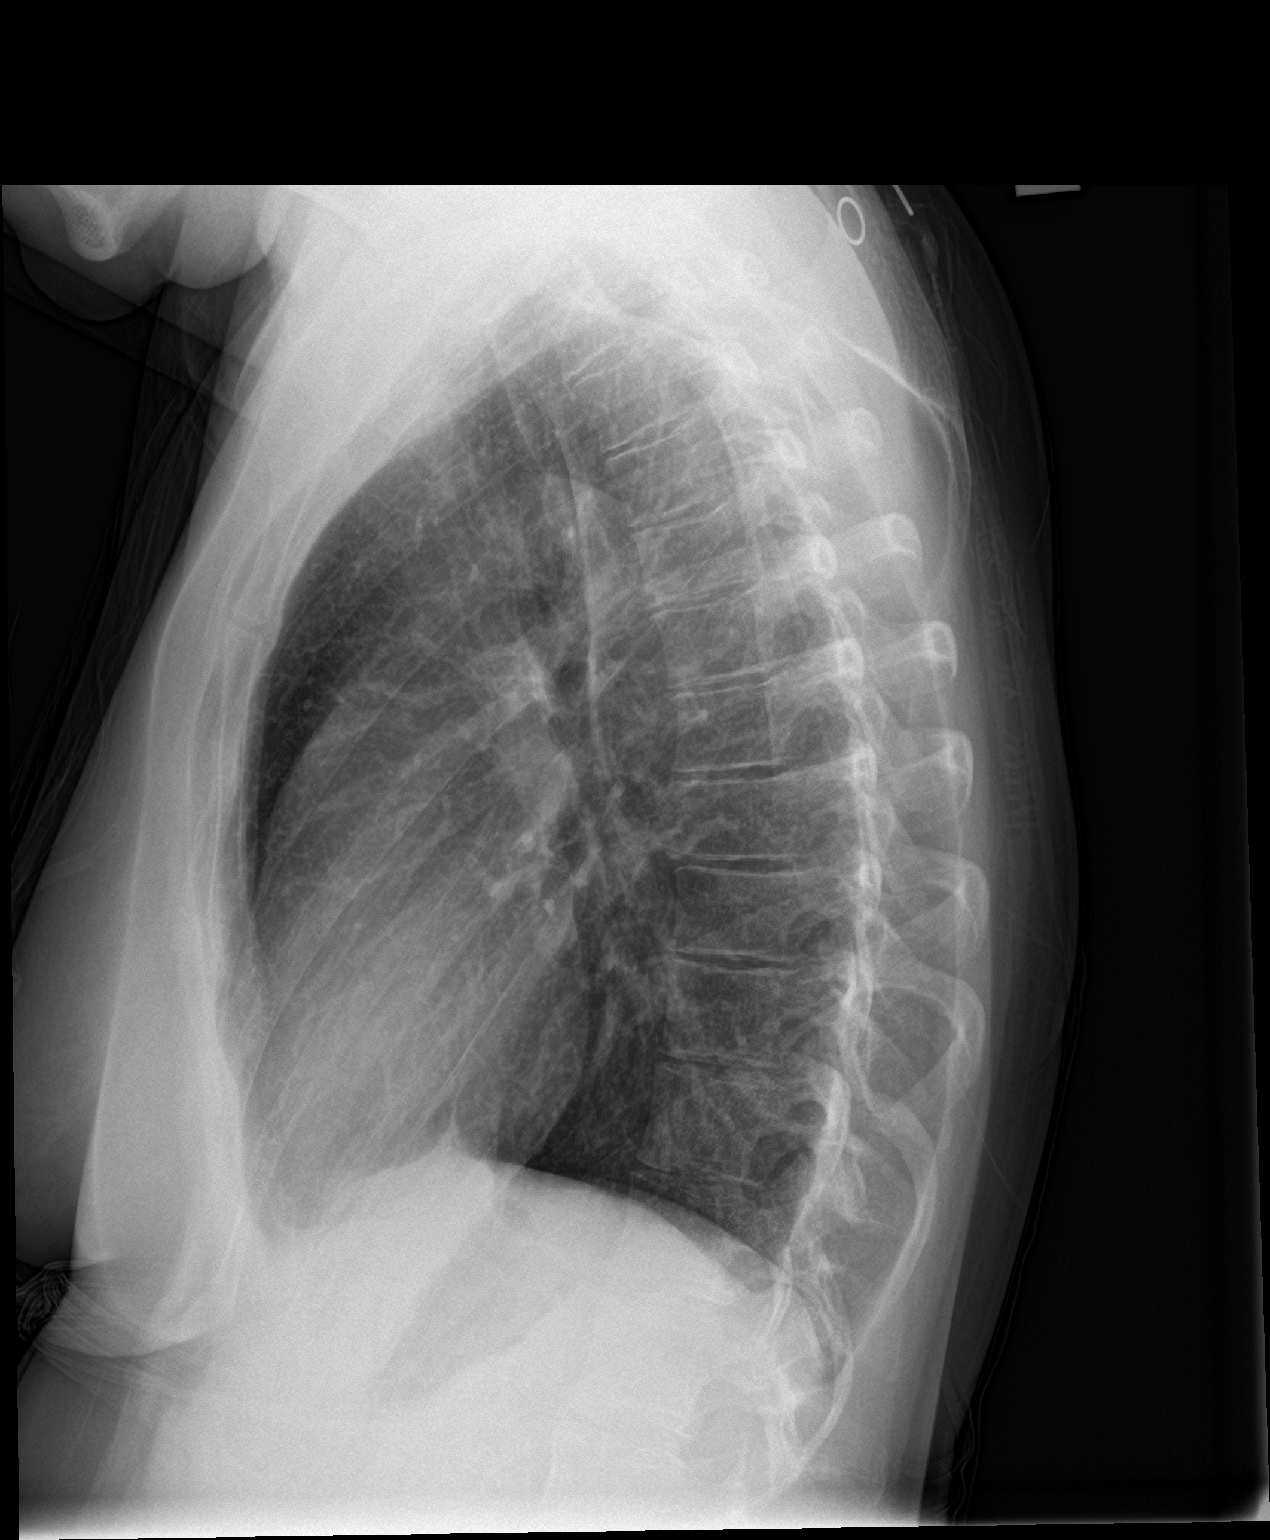

[2 of 2 positions shown; findings below may reference images not displayed]

FINDINGS: The heart size and mediastinal contours are within normal limits.
Both lungs are clear. The visualized skeletal structures are
unremarkable.
IMPRESSION: Negative.  No active cardiopulmonary disease.

## 2019-05-06 ENCOUNTER — Encounter: Payer: Self-pay | Admitting: Nurse Practitioner

## 2019-05-06 ENCOUNTER — Telehealth: Payer: Self-pay

## 2019-05-06 ENCOUNTER — Other Ambulatory Visit: Payer: Self-pay

## 2019-05-06 ENCOUNTER — Ambulatory Visit (INDEPENDENT_AMBULATORY_CARE_PROVIDER_SITE_OTHER): Payer: Medicaid Other | Admitting: Nurse Practitioner

## 2019-05-06 DIAGNOSIS — B182 Chronic viral hepatitis C: Secondary | ICD-10-CM

## 2019-05-06 DIAGNOSIS — R935 Abnormal findings on diagnostic imaging of other abdominal regions, including retroperitoneum: Secondary | ICD-10-CM | POA: Diagnosis not present

## 2019-05-06 DIAGNOSIS — N2889 Other specified disorders of kidney and ureter: Secondary | ICD-10-CM | POA: Diagnosis not present

## 2019-05-06 NOTE — Patient Instructions (Signed)
Your health issues we discussed today were:   Hepatitis C: 1. Have your labs drawn as soon as you can see if we can start deciding on options for possible hepatitis C treatment 2. It is imperative you continue to reduce and abstain from alcohol to help protect your liver 3. Further recommendations will follow you get your labs  Abnormal ultrasound of your abdomen: 1. As we discussed, your ultrasound showed some abnormal spots in your kidneys and your pancreas 2. They recommended an MRI to further evaluate and I have put in an order for this 3. Somebody should call you from radiology to schedule your MRI appointment 4. Further recommendations will follow your results.  Overall I recommend:  1. Continue your other current medications 2. Return for follow-up in 3 months 3. Call us if you have any questions or concerns   ---------------------------------------------------------------  COVID-19 Vaccine Information can be found at: [email or web address] For questions related to vaccine distribution or appointments, please email [email or web address] or call [phone].   ---------------------------------------------------------------   At [facility] we value your feedback. You may receive a survey about your visit today. Please share your experience as we strive to create trusting relationships with our patients to provide genuine, compassionate, quality care.  We appreciate your understanding and patience as we review any laboratory studies, imaging, and other diagnostic tests that are ordered as we care for you. Our office policy is 5 business days for review of these results, and any emergent or urgent results are addressed in a timely manner for your best interest. If you do not hear from our office in 1 week, please contact us.   We also encourage the use of MyChart, which contains your medical information for  your review as well. If you are not enrolled in this feature, an access code is on this after visit summary for your convenience. Thank you for allowing [place] to be involved in your care.  It was great to see you today!  I hope you have a great day!!

## 2019-05-06 NOTE — Progress Notes (Signed)
Referring Provider: Sandi Mealy, MD Primary Care Physician:  Sandi Mealy, MD Primary GI:  Dr. Gala Romney  Chief Complaint  Patient presents with  . Hepatitis C  . Weight Loss    HPI:   Hannah Rhodes is a 39 y.o. female who presents for follow-up on hepatitis C and weight loss.  The patient was last seen in our office 03/10/2019 for hepatitis C positive, alcoholism, weight loss.  The patient's last visit was a virtual office visit and new patient referral due to COVID-19/coronavirus pandemic.  Initial referral for hep C and weight loss.  ER visit in October 2019 at [facility] for abdominal pain.  Hepatitis C positive at that time and referred to GI in January 2020 for hep C but was unable to go due to transportation.  Previous cocaine use and positive for cocaine on UDS in 2018 in emergency department.  Overall weights indicate down 3 pounds compared to objective weights between January 2020 and November 2020.  Lab work-up showed hepatitis C viral load which was found to be 5.9 million copies but no genotype completed.  Acute hepatitis panel positive for hepatitis C antibody, negative hepatitis A IgM and negative hepatitis B core IgM and hepatitis B surface antigen.  No HIV testing previously completed.  Most recent CMP in June 2020 with dramatically elevated AST/ALT of 498/151 (previously elevated 72/30).  Alkaline phosphatase at that time was normal at 110 and bilirubin elevated at 3.0 although typically baseline 1.2-1.4.  Concerningly, 1 year ago, her AST/ALT was 821/444.  Alk phos at that time was 210.  CT scan completed which found no focal liver abnormality, no gallstones or gallbladder wall thickening, no biliary ductal dilation.  Did note involuting left ovarian cyst and no other abnormalities.  Right upper quadrant ultrasound completed 03/22/2017 found normal liver, CBD 3 mm without dilation, fatty liver.  At her last visit she noted she had not eaten in 5 days with poor appetite.  She  indicated she would get hungry, take 3 bites and and feel she needs any more she will be sick.  Has lost 20 pounds objectively over 3 years.  States she has lost more recently over the past 6 months was about 115 pounds in the beginning of the year.  Objectively, her weight seems to be stable since the beginning of the year.  No appetite.  Nausea and vomiting when she drinks alcohol, admits drinking a 22 ounce 4-Loco almost every night.  Has tried to quit drinking but she "cannot".  Previously seen by mental health and went worth which is mandatory due to almost losing her children 3 years ago.  States PCP does not want to refer her to alcohol treatment until her "liver is figured out."  Recommended complete hepatitis C work-up labs, reduce and eventually quit drinking.  Will need alcohol cessation treatment.  Weight loss and poor appetite likely due to chronic, ongoing alcohol abuse.  Follow-up in 6 to 8 weeks.  Hepatitis C risk factors identified including previous IV drug use and tattoos.  Labs, including CBC, CMP, HIV, hepatitis B serologies, hepatitis A, hepatitis C genotype were all ordered but have not been completed.  Most recent CMP in the hospital, AST/ALT 231/63, alk phos elevated at 150, total bilirubin at 1.6.  This was completed 03/15/2019.  Right upper quadrant ultrasound elastography completed 03/24/2019 which found mild hepatic steatosis, no hepatic masses, mild biliary ductal dilation and possible 2 cm pancreatic head mass or peripancreatic lymph node  with recommended MRI without and with contrast for further evaluation.  2.7 cm solid appearing left renal mass suspicious for renal neoplasm, also recommended MRI.  Median K PA of 9.0 which indicates "in the absence of other no clinical signs, rules out cACLD.  Her possible hepatitis C treatment checklist stands as follows:  HCV RNA: 322,470 copies HCV GT: ordered Elastography: ordered Tx history: No Hepatitis A serologies:  ordered Hepatitis B serologies: ordered HIV Serologies: ordered Contraindicated Medications: None at this time Contraception: S/P tubal ligation  Today she states she's doing ok. Hasn't had labs drawn yet. Objectively her weight is up a couple pounds. She corrected me saying her weight was up MORE in the ER and has come back down again. Denies abdominal pain, N/V, hematochezia, melena, fever, chills. Denies URI or flu-like symptoms. Denies loss of sense of taste or smell. Has not been tested previously for COVID-19. Denies chest pain, dyspnea, dizziness, lightheadedness, syncope, near syncope. Denies any other upper or lower GI symptoms.  Currently she states she quit drinking, but went to a superbowl party and drank at the party (about a couple shots and 2-3 beers).   Past Medical History:  Diagnosis Date  . Anxiety   . Chronic hepatitis C ([place])   . COPD (chronic obstructive pulmonary disease) ([place])   . Generalized headaches   . HPV (human papilloma virus) infection   . Hypertension   . Panic attack   . Tachycardia     Past Surgical History:  Procedure Laterality Date  . CESAREAN SECTION    . CESAREAN SECTION    . SMALL INTESTINE SURGERY      Current Outpatient Medications  Medication Sig Dispense Refill  . amLODipine (NORVASC) 10 MG tablet Take 10 mg by mouth daily.    . metoprolol tartrate (LOPRESSOR) 50 MG tablet Take 1.5 tablets (75 mg total) by mouth 2 (two) times daily. 270 tablet 3  . metroNIDAZOLE (FLAGYL) 500 MG tablet Take by mouth.    . nicotine (NICODERM CQ - DOSED IN MG/24 HOURS) 21 mg/24hr patch Place 21 mg onto the skin daily.    Marland Kitchen venlafaxine XR (EFFEXOR-XR) 37.5 MG 24 hr capsule Take by mouth.     No current facility-administered medications for this visit.    Allergies as of 05/06/2019 - Review Complete 05/06/2019  Allergen Reaction Noted  . Adhesive [tape]  03/10/2019    Family History  Problem Relation Age of Onset  . Anxiety disorder Sister   .  Restless legs syndrome Sister   . Heart failure Mother   . CAD Mother   . Heart attack Mother   . Colon cancer Neg Hx     Social History   Socioeconomic History  . Marital status: Single    Spouse name: Not on file  . Number of children: Not on file  . Years of education: Not on file  . Highest education level: Not on file  Occupational History  . Not on file  Tobacco Use  . Smoking status: Current Every Day Smoker    Packs/day: 0.50  . Smokeless tobacco: Never Used  Substance and Sexual Activity  . Alcohol use: Not Currently    Comment: As of 05/06/19: none since superbowl. Previously: 22 ounces of "4 lokos" nightly  . Drug use: Not Currently    Types: Marijuana, Heroin, Oxycodone, Hydrocodone    Comment: "only tried heroine twice"  . Sexual activity: Never  Other Topics Concern  . Not on file  Social History  Narrative  . Not on file   Social Determinants of Health   Financial Resource Strain:   . Difficulty of Paying Living Expenses: Not on file  Food Insecurity:   . Worried About Charity fundraiser in the Last Year: Not on file  . Ran Out of Food in the Last Year: Not on file  Transportation Needs:   . Lack of Transportation (Medical): Not on file  . Lack of Transportation (Non-Medical): Not on file  Physical Activity:   . Days of Exercise per Week: Not on file  . Minutes of Exercise per Session: Not on file  Stress:   . Feeling of Stress : Not on file  Social Connections:   . Frequency of Communication with Friends and Family: Not on file  . Frequency of Social Gatherings with Friends and Family: Not on file  . Attends Religious Services: Not on file  . Active Member of Clubs or Organizations: Not on file  . Attends Archivist Meetings: Not on file  . Marital Status: Not on file    Review of Systems: General: Negative for anorexia, fever, chills, fatigue, weakness. ENT: Negative for hoarseness, difficulty swallowing. CV: Negative for chest  pain, angina, palpitations, peripheral edema.  Respiratory: Negative for dyspnea at rest, cough, sputum, wheezing.  GI: See history of present illness. Endo: Negative for unusual weight change.  Heme: Negative for bruising or bleeding. Allergy: Negative for rash or hives.   Physical Exam: BP (!) 148/101   Pulse 94   Temp (!) 96.6 F (35.9 C) (Temporal)   Ht '4\' 10"'$  (1.473 m)   Wt 95 lb (43.1 kg)   BMI 19.86 kg/m  General:   Alert and oriented. Pleasant and cooperative. Well-nourished and well-developed.  Eyes:  Without icterus, sclera clear and conjunctiva pink.  Ears:  Normal auditory acuity. Cardiovascular:  S1, S2 present without murmurs appreciated. Extremities without clubbing or edema. Respiratory:  Clear to auscultation bilaterally. No wheezes, rales, or rhonchi. No distress.  Gastrointestinal:  +BS, soft, non-tender and non-distended. No HSM noted. No guarding or rebound. No masses appreciated.  Rectal:  Deferred  Musculoskalatal:  Symmetrical without gross deformities. Neurologic:  Alert and oriented x4;  grossly normal neurologically. Psych:  Alert and cooperative. Normal mood and affect. Heme/Lymph/Immune: No excessive bruising noted.    05/06/2019 11:13 AM   Disclaimer: This note was dictated with voice recognition software. Similar sounding words can inadvertently be transcribed and may not be corrected upon review.

## 2019-05-06 NOTE — Assessment & Plan Note (Signed)
Chronic hepatitis C based on previous serology work-up.  She has completed her abdominal ultrasound which shows likely mild fibrosis, no definite cirrhosis.  Liver function appears preserved although she does have intermittent elevated transaminases likely due to persistent alcohol intake.  We are awaiting lab results to be able to discern if she is a candidate for hepatitis C treatment.  Emphasized to her that we need labs back to in order to make these decisions and attempt prior off.  She verbalized understanding.  Complete labs today, follow-up in 3 months.

## 2019-05-06 NOTE — Telephone Encounter (Signed)
PA for MRI abd submitted via [employer]. Case pending. Clinical notes uploaded to case. Service order: [id number].

## 2019-05-06 NOTE — Assessment & Plan Note (Signed)
Abdominal ultrasound completed for liver evaluation found suspicious spots on both the kidney and pancreas and recommended MRI for further interpretation.  I had ordered the MRI now and updated priorities ASAP to be completed within the next 1 to 2 weeks.  We will forward ultrasound results to primary care and, when we receive them, MRI results.  Further recommendations to follow.  Follow-up in 3 months.

## 2019-05-11 NOTE — Telephone Encounter (Signed)
MRI scheduled for 05/21/19 at 7:00am, arrive at 6:30am. NPO 4 hours prior to test. Pt will need creatinine.   Called and informed pt of appt. She will be having previously ordered labs done this week. Appt letter mailed.

## 2019-05-11 NOTE — Telephone Encounter (Signed)
MRI approved. PA# [id number], valid 05/06/19-11/02/19.

## 2019-05-12 ENCOUNTER — Telehealth: Payer: Self-pay | Admitting: Internal Medicine

## 2019-05-12 NOTE — Telephone Encounter (Signed)
Pt called asking where she needs to go to have labs done and what the phone number and address were. She said [facility] and I am seeing [facility]. Please verify with patient where she needs to go for labs. [phone]

## 2019-05-12 NOTE — Telephone Encounter (Signed)
Spoke with pt. Labs were printed for [facility]. Pt is ok to go to [facility] and is aware.

## 2019-05-20 ENCOUNTER — Encounter (HOSPITAL_COMMUNITY): Payer: Self-pay | Admitting: Emergency Medicine

## 2019-05-20 ENCOUNTER — Emergency Department (HOSPITAL_COMMUNITY)
Admission: EM | Admit: 2019-05-20 | Discharge: 2019-05-20 | Disposition: A | Discharge status: Home / Self Care | Payer: Medicaid Other | Attending: Emergency Medicine | Admitting: Emergency Medicine

## 2019-05-20 ENCOUNTER — Other Ambulatory Visit: Payer: Self-pay

## 2019-05-20 ENCOUNTER — Emergency Department (HOSPITAL_COMMUNITY): Payer: Medicaid Other

## 2019-05-20 DIAGNOSIS — I10 Essential (primary) hypertension: Secondary | ICD-10-CM | POA: Diagnosis not present

## 2019-05-20 DIAGNOSIS — R079 Chest pain, unspecified: Secondary | ICD-10-CM | POA: Diagnosis present

## 2019-05-20 DIAGNOSIS — F17210 Nicotine dependence, cigarettes, uncomplicated: Secondary | ICD-10-CM | POA: Insufficient documentation

## 2019-05-20 DIAGNOSIS — R0602 Shortness of breath: Secondary | ICD-10-CM | POA: Insufficient documentation

## 2019-05-20 DIAGNOSIS — J449 Chronic obstructive pulmonary disease, unspecified: Secondary | ICD-10-CM | POA: Diagnosis not present

## 2019-05-20 LAB — CBC
HCT: 35.6 % — ABNORMAL LOW (ref 36.0–46.0)
Hemoglobin: 12.3 g/dL (ref 12.0–15.0)
MCH: 34.1 pg — ABNORMAL HIGH (ref 26.0–34.0)
MCHC: 34.6 g/dL (ref 30.0–36.0)
MCV: 98.6 fL (ref 80.0–100.0)
Platelets: 229 10*3/uL (ref 150–400)
RBC: 3.61 MIL/uL — ABNORMAL LOW (ref 3.87–5.11)
RDW: 14.3 % (ref 11.5–15.5)
WBC: 4 10*3/uL (ref 4.0–10.5)
nRBC: 0 % (ref 0.0–0.2)

## 2019-05-20 LAB — BASIC METABOLIC PANEL
Anion gap: 12 (ref 5–15)
BUN: 5 mg/dL — ABNORMAL LOW (ref 6–20)
CO2: 27 mmol/L (ref 22–32)
Calcium: 8.8 mg/dL — ABNORMAL LOW (ref 8.9–10.3)
Chloride: 95 mmol/L — ABNORMAL LOW (ref 98–111)
Creatinine, Ser: 0.5 mg/dL (ref 0.44–1.00)
GFR calc Af Amer: >60 mL/min (ref >60)
GFR calc non Af Amer: >60 mL/min (ref >60)
Glucose, Bld: 99 mg/dL (ref 70–99)
Potassium: 3.5 mmol/L (ref 3.5–5.1)
Sodium: 134 mmol/L — ABNORMAL LOW (ref 135–145)

## 2019-05-20 LAB — TROPONIN I (HIGH SENSITIVITY)
Troponin I (High Sensitivity): 3 ng/L (ref <18)
Troponin I (High Sensitivity): <2 ng/L (ref <18)

## 2019-05-20 LAB — POC URINE PREG, ED: Preg Test, Ur: NEGATIVE

## 2019-05-20 MED ORDER — LORAZEPAM 1 MG PO TABS
1.0000 mg | ORAL_TABLET | Freq: Once | ORAL | Status: AC
  Administered 2019-05-20: 1 mg via ORAL
  Filled 2019-05-20: qty 1

## 2019-05-20 NOTE — ED Triage Notes (Signed)
Pt c/o chest pain and nausea that started at 0430 this morning. Pt states she has hx of same and was told that it was related to her anxiety and HTN. Pt states that she is worried due to having a MRI scheduled for tomorrow morning.

## 2019-05-20 NOTE — Discharge Instructions (Addendum)
You were evaluated in the Emergency Department and after careful evaluation, we did not find any emergent condition requiring admission or further testing in the hospital. ° °Your exam/testing today was overall reassuring. ° °Please return to the Emergency Department if you experience any worsening of your condition.  We encourage you to follow up with a primary care provider.  Thank you for allowing us to be a part of your care. ° °

## 2019-05-20 NOTE — ED Provider Notes (Signed)
AP-EMERGENCY DEPT [facility] Emergency Department Provider Note MRN:  [id number]  Arrival date & time: 05/20/19     Chief Complaint   Chest Pain   History of Present Illness   Hannah Rhodes is a 39 y.o. year-old female with a history of hypertension, COPD, hepatitis C presenting to the ED with chief complaint of chest pain.  Location: Central chest Duration: 3 hours Onset: Sudden Timing: Constant Description: Anxious sensation Severity: Mild to moderate Exacerbating/Alleviating Factors: None Associated Symptoms: Shortness of breath Pertinent Negatives: Denies fever, no cough, no leg pain or swelling, no abdominal pain, no dizziness   Review of Systems  A complete 10 system review of systems was obtained and all systems are negative except as noted in the HPI and PMH.   Patient's Health History    Past Medical History:  Diagnosis Date  . Anxiety   . Chronic hepatitis C (HCC)   . COPD (chronic obstructive pulmonary disease) (HCC)   . Generalized headaches   . HPV (human papilloma virus) infection   . Hypertension   . Panic attack   . Tachycardia     Past Surgical History:  Procedure Laterality Date  . CESAREAN SECTION    . CESAREAN SECTION    . SMALL INTESTINE SURGERY      Family History  Problem Relation Age of Onset  . Anxiety disorder Sister   . Restless legs syndrome Sister   . Heart failure Mother   . CAD Mother   . Heart attack Mother   . Colon cancer Neg Hx     Social History   Socioeconomic History  . Marital status: Single    Spouse name: Not on file  . Number of children: Not on file  . Years of education: Not on file  . Highest education level: Not on file  Occupational History  . Not on file  Tobacco Use  . Smoking status: Current Every Day Smoker    Packs/day: 0.50  . Smokeless tobacco: Never Used  Substance and Sexual Activity  . Alcohol use: Not Currently    Comment: As of 05/06/19: none since superbowl. Previously: 22 ounces of  "4 lokos" nightly  . Drug use: Not Currently    Types: Marijuana, Heroin, Oxycodone, Hydrocodone    Comment: "only tried heroine twice"  . Sexual activity: Never  Other Topics Concern  . Not on file  Social History Narrative  . Not on file   Social Determinants of Health   Financial Resource Strain:   . Difficulty of Paying Living Expenses: Not on file  Food Insecurity:   . Worried About Programme researcher, broadcasting/film/video in the Last Year: Not on file  . Ran Out of Food in the Last Year: Not on file  Transportation Needs:   . Lack of Transportation (Medical): Not on file  . Lack of Transportation (Non-Medical): Not on file  Physical Activity:   . Days of Exercise per Week: Not on file  . Minutes of Exercise per Session: Not on file  Stress:   . Feeling of Stress : Not on file  Social Connections:   . Frequency of Communication with Friends and Family: Not on file  . Frequency of Social Gatherings with Friends and Family: Not on file  . Attends Religious Services: Not on file  . Active Member of Clubs or Organizations: Not on file  . Attends Banker Meetings: Not on file  . Marital Status: Not on file  Intimate Partner Violence:   .  Fear of Current or Ex-Partner: Not on file  . Emotionally Abused: Not on file  . Physically Abused: Not on file  . Sexually Abused: Not on file     Physical Exam   Vitals:   05/20/19 0647 05/20/19 0727  BP: (!) 150/100 (!) 138/102  Pulse: 97 83  Resp: 18 16  Temp: 99.4 F (37.4 C) 98.7 F (37.1 C)  SpO2: 95% 96%    CONSTITUTIONAL: Well-appearing, NAD NEURO:  Alert and oriented x 3, no focal deficits EYES:  eyes equal and reactive ENT/NECK:  no LAD, no JVD CARDIO: Regular rate, well-perfused, normal S1 and S2 PULM:  CTAB no wheezing or rhonchi GI/GU:  normal bowel sounds, non-distended, non-tender MSK/SPINE:  No gross deformities, no edema SKIN:  no rash, atraumatic PSYCH:  Appropriate speech and behavior  *Additional and/or  pertinent findings included in MDM below  Diagnostic and Interventional Summary    EKG Interpretation  Date/Time:  Wednesday May 20 2019 06:51:41 EST Ventricular Rate:  98 PR Interval:    QRS Duration: 98 QT Interval:  389 QTC Calculation: 497 R Axis:   -11 Text Interpretation: Sinus rhythm Probable left atrial enlargement Low voltage, extremity and precordial leads Minimal ST depression, lateral leads Borderline prolonged QT interval Baseline wander in lead(s) V6 Confirmed by Kennis Carina [phone]) on 05/20/2019 7:07:42 AM      Cardiac Monitoring Interpretation:  Labs Reviewed  BASIC METABOLIC PANEL - Abnormal; Notable for the following components:      Result Value   Sodium 134 (*)    Chloride 95 (*)    BUN 5 (*)    Calcium 8.8 (*)    All other components within normal limits  CBC - Abnormal; Notable for the following components:   RBC 3.61 (*)    HCT 35.6 (*)    MCH 34.1 (*)    All other components within normal limits  POC URINE PREG, ED  TROPONIN I (HIGH SENSITIVITY)  TROPONIN I (HIGH SENSITIVITY)    DG Chest 2 View  Final Result      Medications  LORazepam (ATIVAN) tablet 1 mg (1 mg Oral Given 05/20/19 0725)     Procedures  /  Critical Care Procedures  ED Course and Medical Decision Making  I have reviewed the triage vital signs, the nursing notes, and pertinent available records from the EMR.  Pertinent labs & imaging results that were available during my care of the patient were reviewed by me and considered in my medical decision making (see below for details).     Patient explains she is feeling very anxious about her MRI tomorrow.  She was recently told she has masses on her pancreas and kidney and she is nervous about finding cancer on her MRI.  She is in no acute distress with normal vital signs, no tachycardia, no evidence of DVT on exam, she may have an underlying malignancy but there is little to no concern for PE at this time.  Favoring large  component anxiety, screening with troponin, x-ray.  EKG is without significant change.  Work-up reassuring, patient continues to look and feel well, appropriate for discharge.  Elmer Sow. Pilar Plate, MD [facility] [email or web address]  Final Clinical Impressions(s) / ED Diagnoses     ICD-10-CM   1. Chest pain, unspecified type  R07.9     ED Discharge Orders    None       Discharge Instructions Discussed with and Provided to Patient:  Discharge Instructions     You were evaluated in the Emergency Department and after careful evaluation, we did not find any emergent condition requiring admission or further testing in the hospital.  Your exam/testing today was overall reassuring.  Please return to the Emergency Department if you experience any worsening of your condition.  We encourage you to follow up with a primary care provider.  Thank you for allowing [place] to be a part of your care.        Maudie Flakes, MD 05/20/19 [phone]

## 2019-05-21 ENCOUNTER — Other Ambulatory Visit: Payer: Self-pay | Admitting: Nurse Practitioner

## 2019-05-21 ENCOUNTER — Ambulatory Visit (HOSPITAL_COMMUNITY)
Admission: RE | Admit: 2019-05-21 | Discharge: 2019-05-21 | Disposition: A | Discharge status: Home / Self Care | Payer: Medicaid Other | Source: Ambulatory Visit | Attending: Nurse Practitioner | Admitting: Nurse Practitioner

## 2019-05-21 DIAGNOSIS — N2889 Other specified disorders of kidney and ureter: Secondary | ICD-10-CM | POA: Insufficient documentation

## 2019-05-21 MED ORDER — GADOBUTROL 1 MMOL/ML IV SOLN
4.0000 mL | Freq: Once | INTRAVENOUS | Status: AC | PRN
  Administered 2019-05-21: 4 mL via INTRAVENOUS

## 2019-06-02 ENCOUNTER — Emergency Department (HOSPITAL_COMMUNITY): Payer: Medicaid Other

## 2019-06-02 ENCOUNTER — Emergency Department (HOSPITAL_COMMUNITY)
Admission: EM | Admit: 2019-06-02 | Discharge: 2019-06-02 | Disposition: A | Discharge status: Home / Self Care | Payer: Medicaid Other | Attending: Emergency Medicine | Admitting: Emergency Medicine

## 2019-06-02 ENCOUNTER — Other Ambulatory Visit: Payer: Self-pay

## 2019-06-02 ENCOUNTER — Encounter (HOSPITAL_COMMUNITY): Payer: Self-pay | Admitting: Emergency Medicine

## 2019-06-02 DIAGNOSIS — R112 Nausea with vomiting, unspecified: Secondary | ICD-10-CM | POA: Insufficient documentation

## 2019-06-02 DIAGNOSIS — J449 Chronic obstructive pulmonary disease, unspecified: Secondary | ICD-10-CM | POA: Insufficient documentation

## 2019-06-02 DIAGNOSIS — Z79899 Other long term (current) drug therapy: Secondary | ICD-10-CM | POA: Insufficient documentation

## 2019-06-02 DIAGNOSIS — F17210 Nicotine dependence, cigarettes, uncomplicated: Secondary | ICD-10-CM | POA: Insufficient documentation

## 2019-06-02 DIAGNOSIS — I10 Essential (primary) hypertension: Secondary | ICD-10-CM | POA: Diagnosis not present

## 2019-06-02 DIAGNOSIS — R079 Chest pain, unspecified: Secondary | ICD-10-CM | POA: Insufficient documentation

## 2019-06-02 LAB — COMPREHENSIVE METABOLIC PANEL
ALT: 60 U/L — ABNORMAL HIGH (ref 0–44)
AST: 161 U/L — ABNORMAL HIGH (ref 15–41)
Albumin: 3.8 g/dL (ref 3.5–5.0)
Alkaline Phosphatase: 148 U/L — ABNORMAL HIGH (ref 38–126)
Anion gap: 12 (ref 5–15)
BUN: 5 mg/dL — ABNORMAL LOW (ref 6–20)
CO2: 27 mmol/L (ref 22–32)
Calcium: 8.6 mg/dL — ABNORMAL LOW (ref 8.9–10.3)
Chloride: 98 mmol/L (ref 98–111)
Creatinine, Ser: 0.57 mg/dL (ref 0.44–1.00)
GFR calc Af Amer: >60 mL/min (ref >60)
GFR calc non Af Amer: >60 mL/min (ref >60)
Glucose, Bld: 128 mg/dL — ABNORMAL HIGH (ref 70–99)
Potassium: 3.8 mmol/L (ref 3.5–5.1)
Sodium: 137 mmol/L (ref 135–145)
Total Bilirubin: 0.9 mg/dL (ref 0.3–1.2)
Total Protein: 7.4 g/dL (ref 6.5–8.1)

## 2019-06-02 LAB — CBC WITH DIFFERENTIAL/PLATELET
Abs Immature Granulocytes: 0.01 10*3/uL (ref 0.00–0.07)
Basophils Absolute: 0 10*3/uL (ref 0.0–0.1)
Basophils Relative: 1 %
Eosinophils Absolute: 0 10*3/uL (ref 0.0–0.5)
Eosinophils Relative: 1 %
HCT: 39.2 % (ref 36.0–46.0)
Hemoglobin: 13.5 g/dL (ref 12.0–15.0)
Immature Granulocytes: 0 %
Lymphocytes Relative: 57 %
Lymphs Abs: 2.7 10*3/uL (ref 0.7–4.0)
MCH: 33.6 pg (ref 26.0–34.0)
MCHC: 34.4 g/dL (ref 30.0–36.0)
MCV: 97.5 fL (ref 80.0–100.0)
Monocytes Absolute: 0.3 10*3/uL (ref 0.1–1.0)
Monocytes Relative: 7 %
Neutro Abs: 1.6 10*3/uL — ABNORMAL LOW (ref 1.7–7.7)
Neutrophils Relative %: 34 %
Platelets: 246 10*3/uL (ref 150–400)
RBC: 4.02 MIL/uL (ref 3.87–5.11)
RDW: 14.8 % (ref 11.5–15.5)
WBC: 4.6 10*3/uL (ref 4.0–10.5)
nRBC: 0.4 % — ABNORMAL HIGH (ref 0.0–0.2)

## 2019-06-02 LAB — LIPASE, BLOOD: Lipase: 17 U/L (ref 11–51)

## 2019-06-02 LAB — TROPONIN I (HIGH SENSITIVITY)
Troponin I (High Sensitivity): <2 ng/L (ref <18)
Troponin I (High Sensitivity): <2 ng/L (ref <18)

## 2019-06-02 LAB — HCG, QUANTITATIVE, PREGNANCY: hCG, Beta Chain, Quant, S: <1 m[IU]/mL (ref <5)

## 2019-06-02 MED ORDER — ONDANSETRON 4 MG PO TBDP: 4.0000 mg | ORAL_TABLET | Freq: Three times a day (TID) | ORAL | 0 refills | Status: DC | PRN

## 2019-06-02 MED ORDER — ALUM & MAG HYDROXIDE-SIMETH 200-200-20 MG/5ML PO SUSP
30.0000 mL | Freq: Once | ORAL | Status: AC
  Administered 2019-06-02: 30 mL via ORAL
  Filled 2019-06-02: qty 30

## 2019-06-02 MED ORDER — HYDROXYZINE HCL 25 MG PO TABS
12.5000 mg | ORAL_TABLET | Freq: Once | ORAL | Status: AC
  Administered 2019-06-02: 12.5 mg via ORAL
  Filled 2019-06-02: qty 1

## 2019-06-02 MED ORDER — SODIUM CHLORIDE 0.9 % IV BOLUS
500.0000 mL | Freq: Once | INTRAVENOUS | Status: AC
  Administered 2019-06-02: 500 mL via INTRAVENOUS

## 2019-06-02 MED ORDER — ONDANSETRON HCL 4 MG/2ML IJ SOLN
4.0000 mg | Freq: Once | INTRAMUSCULAR | Status: AC
  Administered 2019-06-02: 4 mg via INTRAVENOUS
  Filled 2019-06-02: qty 2

## 2019-06-02 MED ORDER — OMEPRAZOLE 20 MG PO CPDR: 20.0000 mg | DELAYED_RELEASE_CAPSULE | Freq: Every day | ORAL | 0 refills | Status: DC

## 2019-06-02 MED ORDER — METHOCARBAMOL 500 MG PO TABS: 500.0000 mg | ORAL_TABLET | Freq: Three times a day (TID) | ORAL | 0 refills | Status: DC | PRN

## 2019-06-02 MED ORDER — FAMOTIDINE IN NACL 20-0.9 MG/50ML-% IV SOLN
20.0000 mg | Freq: Once | INTRAVENOUS | Status: AC
  Administered 2019-06-02: 20 mg via INTRAVENOUS
  Filled 2019-06-02: qty 50

## 2019-06-02 NOTE — ED Triage Notes (Signed)
CP that woke her up at 0300, w n/v

## 2019-06-02 NOTE — ED Notes (Signed)
Ambulatory to restroom

## 2019-06-02 NOTE — Discharge Instructions (Signed)
You were seen in the emergency department today for chest pain. Your work-up in the emergency department has been overall reassuring. Your labs have been fairly normal and or similar to previous blood work you have had done.  Your liver function tests were elevated, however they are improved from prior labs you have had done.  Your blood sugar was a bit elevated and your calcium was a bit low, please have these rechecked by your primary care provider.  Your EKG and the enzyme we use to check your heart did not show an acute heart attack at this time. Your chest x-ray was normal.   We are sending you home with the following medicines: -Omeprazole: Take once daily in the morning prior to meals to help with stomach acidity -Zofran: Take every 8 hours as needed for nausea and vomiting -robaxin: this is the muscle relaxer to be taken every 8 hours as needed for chest wall pain, this is meant to help with muscle tightness. Be aware that this medication may make you drowsy therefore the first time you take this it should be at a time you are in an environment where you can rest. Do not drive or operate heavy machinery when taking this medication. Do not drink alcohol or take other sedating medications with this medicine such as narcotics or benzodiazepines.   We have prescribed you new medication(s) today. Discuss the medications prescribed today with your pharmacist as they can have adverse effects and interactions with your other medicines including over the counter and prescribed medications. Seek medical evaluation if you start to experience new or abnormal symptoms after taking one of these medicines, seek care immediately if you start to experience difficulty breathing, feeling of your throat closing, facial swelling, or rash as these could be indications of a more serious allergic reaction     We would like you to follow up closely with your primary care provider and or cardiologist within 1-3 days.  Return to the ER immediately should you experience any new or worsening symptoms including but not limited to return of pain, worsened pain, inability to keep fluids down, coughing up blood, shortness of breath, dizziness, lightheadedness, passing out, or any other concerns that you may have.

## 2019-06-02 NOTE — ED Provider Notes (Signed)
[facility] EMERGENCY DEPARTMENT Provider Note   CSN: [id number] Arrival date & time: 06/02/19  9407     History Chief Complaint  Patient presents with  . Chest Pain    Hannah Rhodes is a 39 y.o. female with a history of COPD, hypertension, anxiety, & chronic hepatitis C who presents to the ED with complaints of chest pain that began @ 0400 this AM. Patient states pain is to the central lower chest, constant, sharp/tight/heavy, 10/10 in severity, no specific alleviating/aggravating factors more so states everything seems to make it worse. She reports associated nausea with 3 episodes of non bloody emesis and a degree of anxiety, states she feels like there is a lump in her throat, but no difficulty swallowing. States history of somewhat similar pain, but not exactly the same. She states she is somewhat stressed as she has a job interview scheduled for this AM. She states she is still pending plan after having found masses to pancreas/kidneys recently, s/p MRI 03/04, awaiting further steps per her outpatient doctors. Denies dyspnea, diaphoresis, diarrhea, melena, hematochezia, unilateral leg pain/swelling, hemoptysis, recent surgery/trauma, recent long travel, hormone use, or hx of DVT/PE. Other than current abnormal findings of pancreas of uncertain etiology, denies history of cancer. Denies drug use. Reports EtOH use a few times per week- 1-2 drinks per sitting, none in the past 48 hours.   Per chart review for additional history.  MR  Abdomen w/wo contrast performed 05/21/19:  IMPRESSION: New 17 mm simple appearing cystic lesion in the pancreatic head compared to prior CT in 2019. Differential diagnosis includes pancreatic pseudocyst and cystic pancreatic neoplasm. Consider further evaluation with EUS/FNA.  Mild biliary ductal dilatation, with benign-appearing stricture of the intrapancreatic portion of the common bile duct. This shows no significant change compared to previous CT. This  could also be evaluated by ERCP.  Mild hepatic steatosis.  Small benign-appearing left renal cyst. No evidence of renal neoplasm.   Electronically Signed   By: Marlaine Hind M.D.   On: 05/21/2019 09:21     HPI     Past Medical History:  Diagnosis Date  . Anxiety   . Chronic hepatitis C ([place])   . COPD (chronic obstructive pulmonary disease) ([place])   . Generalized headaches   . HPV (human papilloma virus) infection   . Hypertension   . Panic attack   . Tachycardia     Patient Active Problem List   Diagnosis Date Noted  . Hepatitis C, chronic ([place]) 05/06/2019  . Abnormal ultrasound of abdomen 05/06/2019  . Hepatitis C antibody positive in blood 03/10/2019  . Alcoholism ([place]) 03/10/2019  . Loss of weight 03/10/2019  . DYSPNEA 10/24/2009  . ANXIETY STATE, UNSPECIFIED 10/21/2009  . NONDEPENDENT TOBACCO USE DISORDER 10/21/2009  . INSOMNIA UNSPECIFIED 10/21/2009  . PALPITATIONS 10/21/2009    Past Surgical History:  Procedure Laterality Date  . CESAREAN SECTION    . CESAREAN SECTION    . SMALL INTESTINE SURGERY       OB History   No obstetric history on file.     Family History  Problem Relation Age of Onset  . Anxiety disorder Sister   . Restless legs syndrome Sister   . Heart failure Mother   . CAD Mother   . Heart attack Mother   . Colon cancer Neg Hx     Social History   Tobacco Use  . Smoking status: Current Every Day Smoker    Packs/day: 0.50  . Smokeless tobacco: Never Used  Substance Use Topics  . Alcohol use: Not Currently    Comment: As of 05/06/19: none since superbowl. Previously: 22 ounces of "4 lokos" nightly  . Drug use: Not Currently    Types: Marijuana, Heroin, Oxycodone, Hydrocodone    Comment: "only tried heroine twice"    Home Medications Prior to Admission medications   Medication Sig Start Date End Date Taking? Authorizing Provider  amLODipine (NORVASC) 10 MG tablet Take 10 mg by mouth daily.    [provider]  buPROPion (WELLBUTRIN XL) 300 MG 24 hr tablet Take 300 mg by mouth daily.  05/19/19   [provider]  metoprolol tartrate (LOPRESSOR) 50 MG tablet Take 1.5 tablets (75 mg total) by mouth 2 (two) times daily. 04/09/19 07/08/19  Herminio Commons, MD  nicotine (NICODERM CQ - DOSED IN MG/24 HOURS) 21 mg/24hr patch Place 21 mg onto the skin daily.    [provider]  ondansetron (ZOFRAN) 4 MG tablet Take 4 mg by mouth daily as needed for nausea.  04/10/19 04/09/20  [provider]  venlafaxine XR (EFFEXOR-XR) 37.5 MG 24 hr capsule Take 37.5 mg by mouth daily with breakfast.  03/24/19 03/23/20  [provider]    Allergies    Adhesive [tape]  Review of Systems   Review of Systems  Constitutional: Negative for chills and fever.  Respiratory: Negative for shortness of breath.   Cardiovascular: Positive for chest pain. Negative for leg swelling.  Gastrointestinal: Positive for nausea and vomiting. Negative for abdominal pain, blood in stool, constipation and diarrhea.  Genitourinary: Negative for dysuria.  Musculoskeletal: Negative for myalgias (legs).  Neurological: Negative for dizziness, syncope, weakness and numbness.  Psychiatric/Behavioral: Negative for suicidal ideas. The patient is nervous/anxious.   All other systems reviewed and are negative.   Physical Exam Updated Vital Signs BP (!) 123/106 (BP Location: Left Arm)   Pulse 75   Temp 98.5 F (36.9 C) (Oral)   Resp 18   Ht 4' 10" (1.473 m)   Wt 43.1 kg   SpO2 98%   BMI 19.86 kg/m   Physical Exam Vitals and nursing note reviewed.  Constitutional:      General: She is not in acute distress.    Appearance: She is well-developed. She is not toxic-appearing.  HENT:     Head: Normocephalic and atraumatic.     Mouth/Throat:     Pharynx: Uvula midline.     Comments: Posterior oropharynx is symmetric appearing. Patient tolerating own secretions without difficulty. No trismus. No drooling. No hot  potato voice. No swelling beneath the tongue, submandibular compartment is soft.  Eyes:     General:        Right eye: No discharge.        Left eye: No discharge.     Conjunctiva/sclera: Conjunctivae normal.  Cardiovascular:     Rate and Rhythm: Normal rate and regular rhythm.     Pulses:          Radial pulses are 2+ on the right side and 2+ on the left side.       Posterior tibial pulses are 2+ on the right side and 2+ on the left side.  Pulmonary:     Effort: Pulmonary effort is normal. No respiratory distress.     Breath sounds: Normal breath sounds. No wheezing, rhonchi or rales.  Chest:     Chest wall: Tenderness (lower anterior chest wall) present.  Abdominal:     General: There is no distension.  Palpations: Abdomen is soft.     Tenderness: There is abdominal tenderness (diffuse upper abdomen, most prominent in the epigastric area). There is no guarding or rebound. Negative signs include Murphy's sign and McBurney's sign.  Musculoskeletal:     Cervical back: Neck supple.     Right lower leg: No tenderness. No edema.     Left lower leg: No tenderness. No edema.  Skin:    General: Skin is warm and dry.     Findings: No rash.  Neurological:     Mental Status: She is alert.     Comments: Clear speech.  Sensation grossly intact bilateral upper and lower extremities.  5 out of 5 symmetric grip strength.  Psychiatric:        Mood and Affect: Mood is anxious.        Behavior: Behavior normal.     ED Results / Procedures / Treatments   Labs (all labs ordered are listed, but only abnormal results are displayed) Labs Reviewed  COMPREHENSIVE METABOLIC PANEL - Abnormal; Notable for the following components:      Result Value   Glucose, Bld 128 (*)    BUN 5 (*)    Calcium 8.6 (*)    AST 161 (*)    ALT 60 (*)    Alkaline Phosphatase 148 (*)    All other components within normal limits  CBC WITH DIFFERENTIAL/PLATELET - Abnormal; Notable for the following components:    nRBC 0.4 (*)    Neutro Abs 1.6 (*)    All other components within normal limits  LIPASE, BLOOD  HCG, QUANTITATIVE, PREGNANCY  TROPONIN I (HIGH SENSITIVITY)    EKG EKG Interpretation  Date/Time:  Tuesday June 02 2019 08:40:41 EDT Ventricular Rate:  81 PR Interval:    QRS Duration: 81 QT Interval:  410 QTC Calculation: 476 R Axis:   -13 Text Interpretation: Sinus rhythm Low voltage, extremity and precordial leads No acute changes No significant change since last tracing Confirmed by Varney Biles ([id number]) on 06/02/2019 10:25:42 AM   Radiology DG Chest Port 1 View  Result Date: 06/02/2019 CLINICAL DATA:  Chest pain EXAM: PORTABLE CHEST 1 VIEW COMPARISON:  05/20/2019 FINDINGS: The heart size and mediastinal contours are within normal limits. Both lungs are clear. The visualized skeletal structures are unremarkable. IMPRESSION: No acute abnormality of the lungs in AP portable projection. Electronically Signed   By: Eddie Candle M.D.   On: 06/02/2019 09:42    Procedures Procedures (including critical care time)  Medications Ordered in ED Medications - No data to display  ED Course  I have reviewed the triage vital signs and the nursing notes.  Pertinent labs & imaging results that were available during my care of the patient were reviewed by me and considered in my medical decision making (see chart for details).    Hannah Rhodes was evaluated in Emergency Department on 06/02/2019 for the symptoms described in the history of present illness. He/she was evaluated in the context of the global COVID-19 pandemic, which necessitated consideration that the patient might be at risk for infection with the SARS-CoV-2 virus that causes COVID-19. Institutional protocols and algorithms that pertain to the evaluation of patients at risk for COVID-19 are in a state of rapid change based on information released by regulatory bodies including the CDC and federal and state organizations. These policies  and algorithms were followed during the patient's care in the ED.  MDM Rules/Calculators/A&P  Patient presents to the emergency department with chest pain. Patient nontoxic appearing, in no apparent distress, vitals without significant abnormality, diastolic BP elevated some. Reproducible pain with lower chest wall tenderness as well as diffuse upper abdominal tenderness w/o peritoneal signs, otherwise fairly benign physical exam.   Chart review for additional history:  Prior stress test 01/10/2017: Good ETT, ECG uninterpretible with stress due to artifact, however immediate post stress ECG without ischemic abnormalities. No chest pain during study.  No prior cardiac catheterizations.  Patient's primary cardiologist: Dr. Bronson Ing   DDX: ACS, pulmonary embolism, dissection, pneumothorax, pneumonia, anemia, MSK, GERD, PUD, pancreatitis, cholecystitis, cholelithiasis, anxiety. Evaluation initiated with labs, EKG, and CXR. Patient on cardiac monitor.   Labs & imaging reviewed & personally interpreted.  CBC: No leukocytosis. No anemia.  CMP: LFTs & alk phos are elevated but improved from prior labs on record. Mild hyperglycemia & hypocalcemia. No significant electrolyte derangement. Creatinine WNL.  Lipase: WNL Troponin: < 2, < 2 EKG: no STEMI, no significant change since last tracing.  CXR: Agree with radiologist's read after personal interpretation.  No acute process..  Negative for infiltrate, effusion, pneumothorax, or fracture/dislocation.   Heart Pathway utilized HEAR score 2, EKG without obvious acute ischemia, delta troponin negative, doubt ACS. Patient currently being worked up for pancreatic pseudocyst vs cystic pancreatic neoplasm on recent MRI, pending FNA, however does not definitively have cancer and is not receiving active treatment, she is not tachycardic (did not have her metoprolol this AM) or hypoxic and is otherwise low risk wells- doubt pulmonary embolism.  Pain is not a tearing sensation, symmetric pulses, no neuro sxs, no widening of mediastinum on CXR, doubt dissection.   11:54: RE-EVAL: Patient states the pain in her upper abdomen is now resolved, she has not had any subsequent nausea/vomiting and is tolerating p.o, repeat abdominal exam is nontender without peritoneal signs, do not suspect acute surgical process.  She still remains with some chest discomfort which is reproducible with anterior chest wall palpation.  Initially she did not recall any heavy lifting/injury, however after being in the ER she does recall that she was doing a lot of heavy lifting of asked of soda on _0 /16/21 [address] Glynda Jaeger, PA-C 06/02/19 1159    Varney Biles, MD 06/02/19 1424

## 2019-06-08 ENCOUNTER — Other Ambulatory Visit: Payer: Self-pay | Admitting: *Deleted

## 2019-06-08 DIAGNOSIS — K862 Cyst of pancreas: Secondary | ICD-10-CM

## 2019-06-09 ENCOUNTER — Ambulatory Visit: Payer: Medicaid Other | Admitting: Nurse Practitioner

## 2019-06-23 ENCOUNTER — Encounter: Payer: Self-pay | Admitting: Gastroenterology

## 2019-07-21 ENCOUNTER — Other Ambulatory Visit (INDEPENDENT_AMBULATORY_CARE_PROVIDER_SITE_OTHER): Payer: Medicaid Other

## 2019-07-21 ENCOUNTER — Ambulatory Visit: Payer: Medicaid Other | Admitting: Gastroenterology

## 2019-07-21 ENCOUNTER — Encounter: Payer: Self-pay | Admitting: Gastroenterology

## 2019-07-21 VITALS — BP 110/60 | HR 102 | Temp 98.4°F | Ht <= 58 in | Wt 91.0 lb

## 2019-07-21 DIAGNOSIS — R7989 Other specified abnormal findings of blood chemistry: Secondary | ICD-10-CM

## 2019-07-21 DIAGNOSIS — K838 Other specified diseases of biliary tract: Secondary | ICD-10-CM | POA: Diagnosis not present

## 2019-07-21 DIAGNOSIS — R945 Abnormal results of liver function studies: Secondary | ICD-10-CM

## 2019-07-21 DIAGNOSIS — K862 Cyst of pancreas: Secondary | ICD-10-CM

## 2019-07-21 DIAGNOSIS — G8929 Other chronic pain: Secondary | ICD-10-CM

## 2019-07-21 DIAGNOSIS — K831 Obstruction of bile duct: Secondary | ICD-10-CM

## 2019-07-21 DIAGNOSIS — R109 Unspecified abdominal pain: Secondary | ICD-10-CM

## 2019-07-21 DIAGNOSIS — B182 Chronic viral hepatitis C: Secondary | ICD-10-CM

## 2019-07-21 DIAGNOSIS — R935 Abnormal findings on diagnostic imaging of other abdominal regions, including retroperitoneum: Secondary | ICD-10-CM | POA: Diagnosis not present

## 2019-07-21 NOTE — Patient Instructions (Addendum)
Your provider has requested that you go to the basement level for lab work before leaving today. Press "B" on the elevator. The lab is located at the first door on the left as you exit the elevator.  You have been scheduled for an endoscopy. Please follow written instructions given to you at your visit today. If you use inhalers (even only as needed), please bring them with you on the day of your procedure.  We have placed a referral to Infectious Disease. Their office will contact you with an appointment.   If you are age 40 or older, your body mass index should be between 23-30. Your Body mass index is 19.02 kg/m. If this is out of the aforementioned range listed, please consider follow up with your Primary Care Provider.  If you are age 54 or younger, your body mass index should be between 19-25. Your Body mass index is 19.02 kg/m. If this is out of the aformentioned range listed, please consider follow up with your Primary Care Provider.   Due to recent changes in healthcare laws, you may see the results of your imaging and laboratory studies on MyChart before your provider has had a chance to review them.  We understand that in some cases there may be results that are confusing or concerning to you. Not all laboratory results come back in the same time frame and the provider may be waiting for multiple results in order to interpret others.  Please give [place] 48 hours in order for your provider to thoroughly review all the results before contacting the office for clarification of your results.    Thank you for choosing me and [facility].  Dr. Meridee Score

## 2019-07-22 ENCOUNTER — Encounter: Payer: Self-pay | Admitting: Gastroenterology

## 2019-07-22 DIAGNOSIS — K862 Cyst of pancreas: Secondary | ICD-10-CM | POA: Insufficient documentation

## 2019-07-22 DIAGNOSIS — G8929 Other chronic pain: Secondary | ICD-10-CM | POA: Insufficient documentation

## 2019-07-22 DIAGNOSIS — R7989 Other specified abnormal findings of blood chemistry: Secondary | ICD-10-CM | POA: Insufficient documentation

## 2019-07-22 DIAGNOSIS — R945 Abnormal results of liver function studies: Secondary | ICD-10-CM | POA: Insufficient documentation

## 2019-07-22 DIAGNOSIS — K831 Obstruction of bile duct: Secondary | ICD-10-CM | POA: Insufficient documentation

## 2019-07-22 DIAGNOSIS — R109 Unspecified abdominal pain: Secondary | ICD-10-CM | POA: Insufficient documentation

## 2019-07-22 DIAGNOSIS — R935 Abnormal findings on diagnostic imaging of other abdominal regions, including retroperitoneum: Secondary | ICD-10-CM | POA: Insufficient documentation

## 2019-07-22 DIAGNOSIS — K838 Other specified diseases of biliary tract: Secondary | ICD-10-CM | POA: Insufficient documentation

## 2019-07-22 LAB — GAMMA GT: GGT: 998 U/L — ABNORMAL HIGH (ref 7–51)

## 2019-07-22 LAB — COMPREHENSIVE METABOLIC PANEL
ALT: 42 U/L — ABNORMAL HIGH (ref 0–35)
AST: 128 U/L — ABNORMAL HIGH (ref 0–37)
Albumin: 3.7 g/dL (ref 3.5–5.2)
Alkaline Phosphatase: 157 U/L — ABNORMAL HIGH (ref 39–117)
BUN: 7 mg/dL (ref 6–23)
CO2: 37 mEq/L — ABNORMAL HIGH (ref 19–32)
Calcium: 9.4 mg/dL (ref 8.4–10.5)
Chloride: 94 mEq/L — ABNORMAL LOW (ref 96–112)
Creatinine, Ser: 0.63 mg/dL (ref 0.40–1.20)
GFR: 104.83 mL/min (ref >60.00)
Glucose, Bld: 128 mg/dL — ABNORMAL HIGH (ref 70–99)
Potassium: 3.4 mEq/L — ABNORMAL LOW (ref 3.5–5.1)
Sodium: 138 mEq/L (ref 135–145)
Total Bilirubin: 0.5 mg/dL (ref 0.2–1.2)
Total Protein: 7.1 g/dL (ref 6.0–8.3)

## 2019-07-22 NOTE — Progress Notes (Signed)
[facility] VISIT   Primary Care Provider Leeanne Rio, MD [address] [place] [address] [phone]  Referring Provider Carlis Stable, NP [address] [place],  [place] [address] [phone]  Patient Profile: Hannah Rhodes is a 39 y.o. female with a pmh significant for chronic hepatitis C (never treated), chronic alcohol use disorder, COPD, hypertension, anxiety/panic, chronic abdominal pain, recent finding of pancreatic cyst of unclear etiology.  The patient presents to the [facility] for an evaluation and management of problem(s) noted below:  Problem List 1. Pancreatic cyst   2. Common bile duct dilation   3. Bile duct stricture   4. Abnormal MRI of abdomen   5. Chronic hepatitis C without hepatic coma ([facility])   6. Abnormal LFTs   7. Chronic abdominal pain     History of Present Illness This is the patient's first visit to the outpatient [facility] clinic.  The patient is followed in [facility] for her issues of chronic abdominal pain as well as chronic hepatitis C.  She underwent elastography in January of this year with finding of a pancreatic lesion as well as a potential kidney lesion.  This was followed up with MRI imaging in March that showed a 17 mm pancreatic cyst as well as some CBD dilation with a patient who still has the gallbladder.  There was concern for potential CBD stricturing in the intrapancreatic portion of the CBD.  Patient has had abnormal liver tests which have been attributed to her previous chronic hepatitis C.  She comes in for evaluation of the pancreatic cyst but also expresses a desire to consider hepatitis C treatment.  She has never undergone treatment but looks like the [facility] team was planning further evaluation and potential treatment referral since they had pursued elastography.  It does not look like her elastography suggest evidence of cirrhosis but she has a potential for chronic liver  disease in the setting of her prior alcohol use and reported hepatitis C never been treated.  Patient has never had pancreatitis.  She had never been told she had pancreatic issues.  No family history of pancreas problems or GI malignancies.  She is never had an upper or lower endoscopy.  She does have abdominal discomfort that come and go.  Sometimes these are postprandial but other times it can occur at any time.  There is no alleviating or aggravating factors for the discomfort that is generalized throughout her abdomen.  GI Review of Systems Positive as above Negative for pyrosis, dysphagia, odynophagia, vomiting, melena, hematochezia  Review of Systems General: Denies fevers/chills/weight loss HEENT: Denies oral lesions Cardiovascular: Denies chest pain Pulmonary: Denies shortness of breath Gastroenterological: See HPI Genitourinary: Denies darkened urine Hematological: Denies easy bruising/bleeding Dermatological: Denies jaundice Psychological: Mood is stable   Medications Current Outpatient Medications  Medication Sig Dispense Refill  . amLODipine (NORVASC) 10 MG tablet Take 10 mg by mouth daily.    Marland Kitchen buPROPion (WELLBUTRIN XL) 300 MG 24 hr tablet Take 300 mg by mouth daily.     Marland Kitchen gabapentin (NEURONTIN) 100 MG capsule Take 1 capsule by mouth in the morning and at bedtime.    . methocarbamol (ROBAXIN) 500 MG tablet Take 1 tablet (500 mg total) by mouth every 8 (eight) hours as needed for muscle spasms. 15 tablet 0  . nicotine (NICODERM CQ - DOSED IN MG/24 HOURS) 21 mg/24hr patch Place 21 mg onto the skin daily.    . metoprolol tartrate (  LOPRESSOR) 50 MG tablet Take 1.5 tablets (75 mg total) by mouth 2 (two) times daily. 270 tablet 3   No current facility-administered medications for this visit.    Allergies Allergies  Allergen Reactions  . Adhesive [Tape]     Histories Past Medical History:  Diagnosis Date  . Anxiety   . Chronic hepatitis C ([place])   . COPD (chronic  obstructive pulmonary disease) ([place])   . Generalized headaches   . HPV (human papilloma virus) infection   . Hypertension   . Panic attack   . Tachycardia    Past Surgical History:  Procedure Laterality Date  . CESAREAN SECTION    . CESAREAN SECTION    . SMALL INTESTINE SURGERY     Social History   Socioeconomic History  . Marital status: Single    Spouse name: Not on file  . Number of children: 7  . Years of education: Not on file  . Highest education level: Not on file  Occupational History  . Occupation: Homemaker  Tobacco Use  . Smoking status: Current Every Day Smoker    Packs/day: 0.50    Types: Cigarettes  . Smokeless tobacco: Never Used  Substance and Sexual Activity  . Alcohol use: Yes    Comment: daily  . Drug use: Not Currently    Types: Marijuana, Heroin, Oxycodone, Hydrocodone    Comment: "only tried heroine twice"  . Sexual activity: Not Currently  Other Topics Concern  . Not on file  Social History Narrative  . Not on file   Social Determinants of Health   Financial Resource Strain:   . Difficulty of Paying Living Expenses:   Food Insecurity:   . Worried About Charity fundraiser in the Last Year:   . Arboriculturist in the Last Year:   Transportation Needs:   . Film/video editor (Medical):   Marland Kitchen Lack of Transportation (Non-Medical):   Physical Activity:   . Days of Exercise per Week:   . Minutes of Exercise per Session:   Stress:   . Feeling of Stress :   Social Connections:   . Frequency of Communication with Friends and Family:   . Frequency of Social Gatherings with Friends and Family:   . Attends Religious Services:   . Active Member of Clubs or Organizations:   . Attends Archivist Meetings:   Marland Kitchen Marital Status:   Intimate Partner Violence:   . Fear of Current or Ex-Partner:   . Emotionally Abused:   Marland Kitchen Physically Abused:   . Sexually Abused:    Family History  Problem Relation Age of Onset  . Anxiety disorder  Sister   . Restless legs syndrome Sister   . Heart failure Mother   . CAD Mother   . Heart attack Mother   . Colon cancer Neg Hx   . Esophageal cancer Neg Hx   . Inflammatory bowel disease Neg Hx   . Liver disease Neg Hx   . Pancreatic cancer Neg Hx   . Stomach cancer Neg Hx   . Rectal cancer Neg Hx    I have reviewed her medical, social, and family history in detail and updated the electronic medical record as necessary.    PHYSICAL EXAMINATION  BP 110/60   Pulse (!) 102   Temp 98.4 F (36.9 C)   Ht '4\' 10"'$  (1.473 m)   Wt 91 lb (41.3 kg)   BMI 19.02 kg/m  Wt Readings from Last 3 Encounters:  07/21/19 91 lb (41.3 kg)  06/02/19 95 lb (43.1 kg)  05/20/19 96 lb (43.5 kg)  GEN: NAD, appears stated age, doesn't appear chronically ill PSYCH: Cooperative, without pressured speech EYE: Conjunctivae pink, sclerae anicteric ENT: MMM CV: Nontachycardic RESP: No audible wheezing GI: NABS, soft, minimal tenderness to palpation, without rebound  MSK/EXT: No significant lower extremity edema SKIN: Potential small spider angiomata noted on upper thorax; no jaundice NEURO:  Alert & Oriented x 3, no focal deficits, no evidence of asterixis   REVIEW OF DATA  I reviewed the following data at the time of this encounter:  GI Procedures and Studies  No relevant GI studies to review  Laboratory Studies  Reviewed those in epic  Imaging Studies  March 2021 MRI abdomen IMPRESSION: New 17 mm simple appearing cystic lesion in the pancreatic head compared to prior CT in 2019. Differential diagnosis includes pancreatic pseudocyst and cystic pancreatic neoplasm. Consider further evaluation with EUS/FNA. Mild biliary ductal dilatation, with benign-appearing stricture of the intrapancreatic portion of the common bile duct. This shows no significant change compared to previous CT. This could also be evaluated by ERCP. Mild hepatic steatosis. Small benign-appearing left renal cyst. No  evidence of renal neoplasm.  January 2021 abdominal ultrasound with elastography IMPRESSION: ULTRASOUND ABDOMEN: Mildly hepatic steatosis.  No hepatic mass identified. Mild biliary ductal dilatation, and possible 2 cm pancreatic head mass or peripancreatic lymph node. Abdomen MRI without and with contrast is recommended further characterization. 2.7 cm solid-appearing left renal mass, suspicious for renal neoplasm. This can also be further characterized by abdomen MRI without and with contrast. ULTRASOUND HEPATIC ELASTOGRAPHY: Median kPa:  9.0 Diagnostic category: ?9 kPa: in the absence of other known clinical signs, rules out cACLD   ASSESSMENT  Ms. Rios is a 39 y.o. female with a pmh significant for chronic hepatitis C (never treated), chronic alcohol use disorder, COPD, hypertension, anxiety/panic, chronic abdominal pain, recent finding of pancreatic cyst of unclear etiology.  The patient is seen today for evaluation and management of:  1. Pancreatic cyst   2. Common bile duct dilation   3. Bile duct stricture   4. Abnormal MRI of abdomen   5. Chronic hepatitis C without hepatic coma ([facility])   6. Abnormal LFTs   7. Chronic abdominal pain    The patient is hemodynamically stable.  The etiology of the pancreatic cyst is unclear at this time.  Although she does not have a large cyst and there is no solid mass or component to it I have some concerns based on the findings on the MRI CP of the biliary ductal dilation and potential narrowing of the intrapancreatic portion of the CBD with the cyst being in that location.  I think an endoscopic ultrasound to evaluate that area in the setting of still having her gallbladder is reasonable to rule out an ampullary lesion as well as rule out any sort of lesions or masses in the distal CBD.  We can be prepared for fine-needle biopsy if needed.  I may consider cyst drainage if it looks like this is compressing or leading to the bile duct dilation.   Depending on how things look I may not sample the cyst but it would depend on how things look at that time.  The risks and benefits of endoscopic evaluation were discussed with the patient; these include but are not limited to the risk of perforation, infection, bleeding, missed lesions, lack of diagnosis, severe illness requiring hospitalization, as well as anesthesia and  sedation related illnesses.  The patient is agreeable to proceed.  I will also proceed with further work-up that would be required for the patient to undergo hepatitis C treatment as she is very interested in learning about this and having this done.  She will follow up with the [facility] team but I will go ahead and complete the hepatitis serologies as well as obtain a viral load and genotype so that the patient can be referred to infectious disease to move forward with potential treatment discussions.  Further evaluation of her chronic GI symptoms will be pursued by the regional GI team.  All patient questions were answered, to the best of my ability, and the patient agrees to the aforementioned plan of action with follow-up as indicated.   PLAN  Laboratories as outlined below to get patient ready for potential hepatitis C evaluation and treatment via infectious disease ID referral placed Proceed with scheduling EUS with possible FNA/FNB to evaluate the dilated biliary tree and the cyst in Follow-up of cyst will be dictated by endoscopic ultrasound fine Chronic GI issues to be further followed up by [facility] primary   Orders Placed This Encounter  Procedures  . Procedural/ Surgical Case Request: UPPER ESOPHAGEAL ENDOSCOPIC ULTRASOUND (EUS)  . Comp Met (CMET)  . Hepatitis A antibody, total  . Hepatitis B Surface AntiBODY  . Hepatitis B Surface AntiGEN  . Hepatitis B Core Antibody, total  . HIV antibody (with reflex)  . Gamma GT  . HCV RNA NAA Qual rfx to Quant  . Hepatitis C Genotype  . Ambulatory referral to  Gastroenterology  . Ambulatory referral to Infectious Disease    New Prescriptions   No medications on file   Modified Medications   No medications on file    Planned Follow Up No follow-ups on file.   Total Time in Face-to-Face and in Coordination of Care for patient including independent/personal interpretation/review of prior testing, medical history, examination, medication adjustment, communicating results with the patient directly, and documentation with the EHR is 45 minutes.   Justice Britain, MD [facility] Office # [phone]

## 2019-07-24 ENCOUNTER — Emergency Department (HOSPITAL_COMMUNITY)
Admission: EM | Admit: 2019-07-24 | Discharge: 2019-07-24 | Disposition: A | Discharge status: Home / Self Care | Payer: Medicaid Other | Attending: Emergency Medicine | Admitting: Emergency Medicine

## 2019-07-24 ENCOUNTER — Other Ambulatory Visit: Payer: Self-pay

## 2019-07-24 ENCOUNTER — Emergency Department (HOSPITAL_COMMUNITY): Payer: Medicaid Other

## 2019-07-24 ENCOUNTER — Telehealth: Payer: Self-pay

## 2019-07-24 ENCOUNTER — Encounter (HOSPITAL_COMMUNITY): Payer: Self-pay | Admitting: *Deleted

## 2019-07-24 DIAGNOSIS — J449 Chronic obstructive pulmonary disease, unspecified: Secondary | ICD-10-CM | POA: Insufficient documentation

## 2019-07-24 DIAGNOSIS — F17210 Nicotine dependence, cigarettes, uncomplicated: Secondary | ICD-10-CM | POA: Insufficient documentation

## 2019-07-24 DIAGNOSIS — I10 Essential (primary) hypertension: Secondary | ICD-10-CM | POA: Diagnosis not present

## 2019-07-24 DIAGNOSIS — R0602 Shortness of breath: Secondary | ICD-10-CM | POA: Insufficient documentation

## 2019-07-24 DIAGNOSIS — R0789 Other chest pain: Secondary | ICD-10-CM | POA: Diagnosis present

## 2019-07-24 DIAGNOSIS — F419 Anxiety disorder, unspecified: Secondary | ICD-10-CM | POA: Insufficient documentation

## 2019-07-24 DIAGNOSIS — Z79899 Other long term (current) drug therapy: Secondary | ICD-10-CM | POA: Diagnosis not present

## 2019-07-24 DIAGNOSIS — R112 Nausea with vomiting, unspecified: Secondary | ICD-10-CM | POA: Insufficient documentation

## 2019-07-24 LAB — HEPATITIS C GENOTYPE

## 2019-07-24 LAB — COMPREHENSIVE METABOLIC PANEL
ALT: 57 U/L — ABNORMAL HIGH (ref 0–44)
AST: 185 U/L — ABNORMAL HIGH (ref 15–41)
Albumin: 3.2 g/dL — ABNORMAL LOW (ref 3.5–5.0)
Alkaline Phosphatase: 167 U/L — ABNORMAL HIGH (ref 38–126)
Anion gap: 17 — ABNORMAL HIGH (ref 5–15)
BUN: 6 mg/dL (ref 6–20)
CO2: 29 mmol/L (ref 22–32)
Calcium: 8.7 mg/dL — ABNORMAL LOW (ref 8.9–10.3)
Chloride: 93 mmol/L — ABNORMAL LOW (ref 98–111)
Creatinine, Ser: 0.57 mg/dL (ref 0.44–1.00)
GFR calc Af Amer: >60 mL/min (ref >60)
GFR calc non Af Amer: >60 mL/min (ref >60)
Glucose, Bld: 125 mg/dL — ABNORMAL HIGH (ref 70–99)
Potassium: 3.7 mmol/L (ref 3.5–5.1)
Sodium: 139 mmol/L (ref 135–145)
Total Bilirubin: 1.1 mg/dL (ref 0.3–1.2)
Total Protein: 6.5 g/dL (ref 6.5–8.1)

## 2019-07-24 LAB — CBC WITH DIFFERENTIAL/PLATELET
Abs Immature Granulocytes: 0.01 10*3/uL (ref 0.00–0.07)
Basophils Absolute: 0.1 10*3/uL (ref 0.0–0.1)
Basophils Relative: 1 %
Eosinophils Absolute: 0 10*3/uL (ref 0.0–0.5)
Eosinophils Relative: 0 %
HCT: 34.1 % — ABNORMAL LOW (ref 36.0–46.0)
Hemoglobin: 11.6 g/dL — ABNORMAL LOW (ref 12.0–15.0)
Immature Granulocytes: 0 %
Lymphocytes Relative: 35 %
Lymphs Abs: 1.8 10*3/uL (ref 0.7–4.0)
MCH: 33.7 pg (ref 26.0–34.0)
MCHC: 34 g/dL (ref 30.0–36.0)
MCV: 99.1 fL (ref 80.0–100.0)
Monocytes Absolute: 0.4 10*3/uL (ref 0.1–1.0)
Monocytes Relative: 8 %
Neutro Abs: 2.9 10*3/uL (ref 1.7–7.7)
Neutrophils Relative %: 56 %
Platelets: 287 10*3/uL (ref 150–400)
RBC: 3.44 MIL/uL — ABNORMAL LOW (ref 3.87–5.11)
RDW: 16.3 % — ABNORMAL HIGH (ref 11.5–15.5)
WBC: 5.1 10*3/uL (ref 4.0–10.5)
nRBC: 0 % (ref 0.0–0.2)

## 2019-07-24 LAB — HEPATITIS B SURFACE ANTIGEN: Hepatitis B Surface Ag: NONREACTIVE

## 2019-07-24 LAB — HCV REAL-TIME, PCR, QUANT
Hepatitis C Quantitation: 635000 IU/mL
log 10: 5.803 log10 IU/mL

## 2019-07-24 LAB — HIV ANTIBODY (ROUTINE TESTING W REFLEX): HIV 1&2 Ab, 4th Generation: NONREACTIVE

## 2019-07-24 LAB — HEPATITIS B CORE ANTIBODY, TOTAL: Hep B Core Total Ab: NONREACTIVE

## 2019-07-24 LAB — HCV RNA NAA QUAL RFX TO QUANT: HCV RNA NAA Qualitative: POSITIVE — AB

## 2019-07-24 LAB — TROPONIN I (HIGH SENSITIVITY)
Troponin I (High Sensitivity): 3 ng/L (ref <18)
Troponin I (High Sensitivity): 3 ng/L (ref <18)

## 2019-07-24 LAB — HEPATITIS A ANTIBODY, TOTAL: Hepatitis A AB,Total: NONREACTIVE

## 2019-07-24 LAB — HEPATITIS B SURFACE ANTIBODY,QUALITATIVE: Hep B S Ab: NONREACTIVE

## 2019-07-24 MED ORDER — PROMETHAZINE HCL 25 MG/ML IJ SOLN
12.5000 mg | Freq: Once | INTRAMUSCULAR | Status: AC
  Administered 2019-07-24: 13:00:00 12.5 mg via INTRAMUSCULAR
  Filled 2019-07-24: qty 1

## 2019-07-24 NOTE — Telephone Encounter (Signed)
The pt EUS has been rescheduled to 09/28/19 at [facility] at 730 am due to Dr Sable Feil schedule change.  COVID test on 7/8/21at 10 am.  The pt is currently in the ED I will call at a later time.

## 2019-07-24 NOTE — ED Notes (Signed)
Patient is drinking sprite without nausea or vomiting.

## 2019-07-24 NOTE — Discharge Instructions (Addendum)
Your work-up today was reassuring.  Call your primary doctor for a follow-up appointment.

## 2019-07-24 NOTE — ED Provider Notes (Signed)
[facility] EMERGENCY DEPARTMENT Provider Note   CSN: [id number] Arrival date & time: 07/24/19  1104     History Chief Complaint  Patient presents with  . Panic Attack    Hannah Rhodes is a 39 y.o. female.  HPI      Hannah Rhodes is a 39 y.o. female with past medical history significant for COPD, chronic hepatitis C, anxiety, hypertension, alcoholism and recurrent panic attacks who presents to the Emergency Department complaining of sudden onset of chest pain, dizziness  and anxiety that started at 8 AM this morning.  She states shortly after this she developed substernal chest pain and multiple episodes of vomiting.  She states the symptoms have continued throughout the day.  She describes the chest pain as sharp and constant and her dizziness is mostly with position change.  She states she is currently not taking any medications for her anxiety.  She has noted to be hypertensive on arrival but states that she was unable to take her blood pressure medication this morning due to her persistent vomiting.  She also endorses recent increased stressors with family life she believes may have triggered her anxiety. No SI or HI   Past Medical History:  Diagnosis Date  . Anxiety   . Chronic hepatitis C ([place])   . COPD (chronic obstructive pulmonary disease) ([place])   . Generalized headaches   . HPV (human papilloma virus) infection   . Hypertension   . Panic attack   . Tachycardia     Patient Active Problem List   Diagnosis Date Noted  . Pancreatic cyst 07/22/2019  . Chronic abdominal pain 07/22/2019  . Abnormal LFTs 07/22/2019  . Abnormal MRI of abdomen 07/22/2019  . Bile duct stricture 07/22/2019  . Common bile duct dilation 07/22/2019  . Hepatitis C, chronic ([place]) 05/06/2019  . Abnormal ultrasound of abdomen 05/06/2019  . Hepatitis C antibody positive in blood 03/10/2019  . Alcoholism ([place]) 03/10/2019  . Loss of weight 03/10/2019  . DYSPNEA 10/24/2009  . ANXIETY STATE,  UNSPECIFIED 10/21/2009  . NONDEPENDENT TOBACCO USE DISORDER 10/21/2009  . INSOMNIA UNSPECIFIED 10/21/2009  . PALPITATIONS 10/21/2009    Past Surgical History:  Procedure Laterality Date  . CESAREAN SECTION    . CESAREAN SECTION    . SMALL INTESTINE SURGERY       OB History   No obstetric history on file.     Family History  Problem Relation Age of Onset  . Anxiety disorder Sister   . Restless legs syndrome Sister   . Heart failure Mother   . CAD Mother   . Heart attack Mother   . Colon cancer Neg Hx   . Esophageal cancer Neg Hx   . Inflammatory bowel disease Neg Hx   . Liver disease Neg Hx   . Pancreatic cancer Neg Hx   . Stomach cancer Neg Hx   . Rectal cancer Neg Hx     Social History   Tobacco Use  . Smoking status: Current Every Day Smoker    Packs/day: 0.50    Types: Cigarettes  . Smokeless tobacco: Never Used  Substance Use Topics  . Alcohol use: Yes    Comment: daily  . Drug use: Not Currently    Types: Marijuana, Heroin, Oxycodone, Hydrocodone    Comment: "only tried heroine twice"    Home Medications Prior to Admission medications   Medication Sig Start Date End Date Taking? Authorizing Provider  amLODipine (NORVASC) 10 MG tablet Take 10 mg by mouth  daily.    [provider]  buPROPion (WELLBUTRIN XL) 300 MG 24 hr tablet Take 300 mg by mouth daily.  05/19/19   [provider]  gabapentin (NEURONTIN) 100 MG capsule Take 1 capsule by mouth in the morning and at bedtime. 06/23/19 06/22/20  [provider]  methocarbamol (ROBAXIN) 500 MG tablet Take 1 tablet (500 mg total) by mouth every 8 (eight) hours as needed for muscle spasms. 06/02/19   Petrucelli, Samantha R, PA-C  metoprolol tartrate (LOPRESSOR) 50 MG tablet Take 1.5 tablets (75 mg total) by mouth 2 (two) times daily. 04/09/19 07/08/19  Laqueta Linden, MD  nicotine (NICODERM CQ - DOSED IN MG/24 HOURS) 21 mg/24hr patch Place 21 mg onto the skin daily.    [provider]    Allergies    Adhesive [tape]  Review of Systems   Review of Systems  Constitutional: Negative for activity change, appetite change and fever.  Respiratory: Positive for shortness of breath. Negative for chest tightness.   Cardiovascular: Positive for chest pain.  Gastrointestinal: Positive for nausea and vomiting. Negative for abdominal pain.  Genitourinary: Negative for dysuria and flank pain.  Musculoskeletal: Negative for arthralgias and back pain.  Skin: Negative for color change and rash.  Neurological: Positive for dizziness. Negative for seizures, syncope, weakness and light-headedness.  Psychiatric/Behavioral: Negative for agitation and confusion.    Physical Exam Updated Vital Signs BP (!) 131/109   Pulse 92   Temp 98.6 F (37 C) (Oral)   Resp (!) 22   Ht 4\' 10"  (1.473 m)   Wt 40.8 kg   LMP  (LMP Unknown)   SpO2 100%   BMI 18.81 kg/m   Physical Exam Vitals and nursing note reviewed.  Constitutional:      Appearance: She is not ill-appearing or toxic-appearing.     Comments: Patient is thin and appears older than her stated age.  HENT:     Head: Atraumatic.     Mouth/Throat:     Mouth: Mucous membranes are moist.     Pharynx: Oropharynx is clear.  Eyes:     Extraocular Movements: Extraocular movements intact.     Conjunctiva/sclera: Conjunctivae normal.  Cardiovascular:     Rate and Rhythm: Normal rate and regular rhythm.     Pulses: Normal pulses.  Pulmonary:     Effort: Pulmonary effort is normal. No respiratory distress.     Breath sounds: Normal breath sounds. No wheezing.  Chest:     Chest wall: No tenderness.  Abdominal:     General: There is no distension.     Palpations: Abdomen is soft.     Tenderness: There is no abdominal tenderness.  Musculoskeletal:     Cervical back: Normal range of motion. No tenderness.  Neurological:     Mental Status: She is alert.     ED Results / Procedures / Treatments   Labs (all labs  ordered are listed, but only abnormal results are displayed) Labs Reviewed  CBC WITH DIFFERENTIAL/PLATELET - Abnormal; Notable for the following components:      Result Value   RBC 3.44 (*)    Hemoglobin 11.6 (*)    HCT 34.1 (*)    RDW 16.3 (*)    All other components within normal limits  COMPREHENSIVE METABOLIC PANEL - Abnormal; Notable for the following components:   Chloride 93 (*)    Glucose, Bld 125 (*)    Calcium 8.7 (*)    Albumin 3.2 (*)  AST 185 (*)    ALT 57 (*)    Alkaline Phosphatase 167 (*)    Anion gap 17 (*)    All other components within normal limits  TROPONIN I (HIGH SENSITIVITY)  TROPONIN I (HIGH SENSITIVITY)    EKG    Date: 07/24/2019  Rate: 101  Rhythm: normal sinus tachycardia  QRS Axis: low voltage   Intervals: normal  ST/T Wave abnormalities: normal  Conduction Disutrbances: none  Narrative Interpretation: sinus tachycardia with occasional PVC's   EKG reviewed by Dr. Estell Harpin.     Radiology DG Chest Portable 1 View  Result Date: 07/24/2019 CLINICAL DATA:  Panic attack. EXAM: PORTABLE CHEST 1 VIEW COMPARISON:  Single view of the chest 06/02/2019. PA and lateral chest 02/09/2018. FINDINGS: The heart size and mediastinal contours are within normal limits. The lungs are clear. The visualized skeletal structures are unremarkable. IMPRESSION: Negative chest. Electronically Signed   By: Drusilla Kanner M.D.   On: 07/24/2019 13:12    Procedures Procedures (including critical care time)  Medications Ordered in ED Medications  promethazine (PHENERGAN) injection 12.5 mg (has no administration in time range)    ED Course  I have reviewed the triage vital signs and the nursing notes.  Pertinent labs & imaging results that were available during my care of the patient were reviewed by me and considered in my medical decision making (see chart for details).    MDM Rules/Calculators/A&P                      Patient here with chest pain and  vomiting and history of anxiety.  She does endorse recent increased stressors.  No active vomiting during ER stay.  Will obtain chest x-ray, EKG, and laboratory studies.  Patient is well appearing and nontoxic.  On recheck, patient has received antiemetic and reports feeling better.  She is tolerating oral fluids without further vomiting.  No abdominal pain or other symptoms to suggest acute abdomen.  Transaminases are elevated but this appears to be chronic and consistent with history of alcoholism and hepatitis C.  Delta troponin is reassuring.  Doubt ACS.  Patient appears calm.  She is awaiting PCP follow-up for further evaluation of her anxiety.  Return precautions discussed.  Final Clinical Impression(s) / ED Diagnoses Final diagnoses:  Anxiety  Non-intractable vomiting with nausea, unspecified vomiting type    Rx / DC Orders ED Discharge Orders    None       Pauline Aus, PA-C 07/24/19 1641    Bethann Berkshire, MD 07/25/19 [phone]

## 2019-07-24 NOTE — ED Triage Notes (Signed)
C/o panic attack onset this am, states she vomited afterward

## 2019-07-27 NOTE — Telephone Encounter (Signed)
The patient has been notified of this information and all questions answered. The pt has been advised of the information and verbalized understanding.    

## 2019-08-05 ENCOUNTER — Encounter: Payer: Self-pay | Admitting: Nurse Practitioner

## 2019-08-05 ENCOUNTER — Ambulatory Visit (INDEPENDENT_AMBULATORY_CARE_PROVIDER_SITE_OTHER): Payer: Medicaid Other | Admitting: Nurse Practitioner

## 2019-08-05 ENCOUNTER — Other Ambulatory Visit: Payer: Self-pay

## 2019-08-05 VITALS — BP 126/86 | HR 81 | Temp 97.5°F | Ht <= 58 in | Wt 94.6 lb

## 2019-08-05 DIAGNOSIS — B182 Chronic viral hepatitis C: Secondary | ICD-10-CM | POA: Diagnosis not present

## 2019-08-05 DIAGNOSIS — R935 Abnormal findings on diagnostic imaging of other abdominal regions, including retroperitoneum: Secondary | ICD-10-CM

## 2019-08-05 DIAGNOSIS — R634 Abnormal weight loss: Secondary | ICD-10-CM

## 2019-08-05 NOTE — Progress Notes (Signed)
Referring Provider: Leeanne Rio, MD Primary Care Physician:  Leeanne Rio, MD Primary GI:  Dr. Gala Romney  Chief Complaint  Patient presents with  . pancreas cyst    doing ok    HPI:   Hannah Rhodes is a 39 y.o. female who presents for follow-up on chronic hepatitis C, abnormal ultrasound, kidney/ureteral disorder.  The patient was last seen in our office 05/06/2019 for the same.  Noted history of positive hepatitis C and alcoholism.  Noted previous cocaine use with UDS in 2018+ for cocaine.  Hepatitis C was confirmed positive with RNA at 5.9 million copies but no genotype was completed initially.  Follow-up labs found negative hepatitis A IgM, negative hepatitis B serologies.  Initial drastic elevation of AST/ALT at 498/151 in June 2020 (previously 72/30).  Alk phos at that time was normal at 110 and bilirubin elevated at 3.0 although typically baseline is 1.2-1.4.  Another previous lab test in 2019 found AST/ALT at 821/444, alk phos 210.  CT found no focal liver abnormality or biliary duct dilation.  Most recent CMP found AST/ALT of 231/63, alk phos at 150, total bilirubin 1.6 which was completed on 03/15/2019.  Other labs including HIV, hep C genotype, etc. were not completed.  Right upper quadrant ultrasound 03/24/2019 found mild hepatic steatosis, no masses, mild biliary ductal dilation and possible 2 cm pancreatic head mass or peripancreatic lymph node with recommended MRI to further evaluate.  2.7 cm solid appearing left renal mass suspicious for renal neoplasm and also recommended MRI.  Her elastography found a median K PA of 9.0 which indicates "in the absence of other clinical signs, rules out cACLD."  At her last visit she noted she still has yet to complete her labs.  Weight is up a couple pounds objectively.  However, she stated her weight had actually been higher in ER and is now back down again suggesting she is having persistent subjective weight loss.  No other overt GI  complaints.  States she currently quit drinking but went to a [employer] and drank at the party (a couple shots in 2-3 beers).  Recommended again to have hepatitis C labs completed, reduce and abstain from alcohol, follow-up MRI, follow-up in 3 months.  It appears she did finally complete her labs on 07/21/2019 after a visit in [place].  Hepatitis C genotype found to be 1B, RNA positive, GGT elevated at 998, HIV negative, hepatitis B serologies all negative indicating no previous or current infection but also no immunity.  Hepatitis A was normal.  CMP found normal creatinine 0.63, elevated alk phos at 157, improved but persistently elevated AST/ALT at 128/42, total bilirubin normal at 0.5.  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  Cystic lesion of the pancreas again seen in the pancreatic head adjacent to the area with a thick CBD stricture without definite communication.  No complex features such as septations, mural nodules, or soft tissue component noted.  No evidence of pancreatic ductal dilation.  Differentials include pancreatic pseudocyst and cystic pancreatic neoplasm and recommended EUS/FNA.  Also noted small benign-appearing left renal cyst without evidence of renal neoplasm.  The patient was referred to [facility] for EUS/FNA. She saw Dr. Rush Landmark on 07/21/2019.  At that time noted unclear etiology of pancreatic cyst.  Recommended EUS with possible FNA depending on how things look.  Also recommended completing hepatitis C serologies and consideration of referral to infectious disease versus primary GI for treatment.  EUS is scheduled for 09/28/2019.  Today she states she's doing ok overall. Denies abdominal pain, N/V, hematochezia, melena, fever,  chills, unintentional weight loss (her weight has been increasing now). Denies yellowing of skin/eyes, darkened urine. Denies URI or flu-like symptoms. Denies loss of sense of taste or smell. The patient has not received COVID-19 vaccination(s). They are not interested in vaccine information. Denies chest pain, dyspnea, dizziness, lightheadedness, syncope, near syncope. Denies any other upper or lower GI symptoms.   Past Medical History:  Diagnosis Date  . Anxiety   . Chronic hepatitis C ([place])   . COPD (chronic obstructive pulmonary disease) ([place])   . Generalized headaches   . HPV (human papilloma virus) infection   . Hypertension   . Panic attack   . Tachycardia     Past Surgical History:  Procedure Laterality Date  . CESAREAN SECTION    . CESAREAN SECTION    . SMALL INTESTINE SURGERY      Current Outpatient Medications  Medication Sig Dispense Refill  . amLODipine (NORVASC) 10 MG tablet Take 10 mg by mouth daily.    Marland Kitchen buPROPion (WELLBUTRIN XL) 300 MG 24 hr tablet Take 300 mg by mouth daily.     Marland Kitchen gabapentin (NEURONTIN) 100 MG capsule Take 1 capsule by mouth in the morning and at bedtime.    . methocarbamol (ROBAXIN) 500 MG tablet Take 1 tablet (500 mg total) by mouth every 8 (eight) hours as needed for muscle spasms. 15 tablet 0  . metoprolol tartrate (LOPRESSOR) 50 MG tablet Take 1.5 tablets (75 mg total) by mouth 2 (two) times daily. 270 tablet 3  . nicotine (NICODERM CQ - DOSED IN MG/24 HOURS) 21 mg/24hr patch Place 21 mg onto the skin daily.     No current facility-administered medications for this visit.    Allergies as of 08/05/2019 - Review Complete 08/05/2019  Allergen Reaction Noted  . Adhesive [tape]  03/10/2019    Family History  Problem Relation Age of Onset  . Anxiety disorder Sister   . Restless legs syndrome Sister   . Heart failure Mother   . CAD Mother   . Heart attack Mother   . Colon cancer Neg Hx   . Esophageal cancer Neg Hx   . Inflammatory bowel  disease Neg Hx   . Liver disease Neg Hx   . Pancreatic cancer Neg Hx   . Stomach cancer Neg Hx   . Rectal cancer Neg Hx     Social History   Socioeconomic History  . Marital status: Single    Spouse name: Not on file  . Number of children: 7  . Years of education: Not on file  . Highest education level: Not on file  Occupational History  . Occupation: Homemaker  Tobacco Use  . Smoking status: Current Every Day Smoker    Packs/day: 0.50    Types: Cigarettes  . Smokeless tobacco: Never Used  Substance and Sexual Activity  . Alcohol use: Yes    Comment: As of 08/05/19: on some weekends (typically 2-3 beers a sitting); Previously: ETOH abuse  . Drug use: Not Currently    Types: Marijuana, Heroin, Oxycodone, Hydrocodone    Comment: "only tried heroine twice"  . Sexual activity: Not Currently  Other Topics Concern  . Not on file  Social History Narrative  .  Not on file   Social Determinants of Health   Financial Resource Strain:   . Difficulty of Paying Living Expenses:   Food Insecurity:   . Worried About Charity fundraiser in the Last Year:   . Arboriculturist in the Last Year:   Transportation Needs:   . Film/video editor (Medical):   Marland Kitchen Lack of Transportation (Non-Medical):   Physical Activity:   . Days of Exercise per Week:   . Minutes of Exercise per Session:   Stress:   . Feeling of Stress :   Social Connections:   . Frequency of Communication with Friends and Family:   . Frequency of Social Gatherings with Friends and Family:   . Attends Religious Services:   . Active Member of Clubs or Organizations:   . Attends Archivist Meetings:   Marland Kitchen Marital Status:     Subjective: Review of Systems  Constitutional: Negative for chills, fever, malaise/fatigue and weight loss.  HENT: Negative for congestion and sore throat.   Respiratory: Negative for cough and shortness of breath.   Cardiovascular: Negative for chest pain and palpitations.    Gastrointestinal: Negative for abdominal pain, blood in stool, diarrhea, melena, nausea and vomiting.  Musculoskeletal: Negative for joint pain and myalgias.  Skin: Negative for rash.  Neurological: Negative for dizziness and weakness.  Endo/Heme/Allergies: Does not bruise/bleed easily.  Psychiatric/Behavioral: Negative for depression. The patient is not nervous/anxious.   All other systems reviewed and are negative.    Objective: BP 126/86   Pulse 81   Temp (!) 97.5 F (36.4 C) (Temporal)   Ht '4\' 10"'$  (1.473 m)   Wt 94 lb 9.6 oz (42.9 kg)   LMP 06/22/2019 (Approximate)   BMI 19.77 kg/m  Physical Exam Vitals and nursing note reviewed.  Constitutional:      General: She is not in acute distress.    Appearance: Normal appearance. She is well-developed. She is not ill-appearing, toxic-appearing or diaphoretic.  HENT:     Head: Normocephalic and atraumatic.     Nose: No congestion or rhinorrhea.  Eyes:     General: No scleral icterus. Cardiovascular:     Rate and Rhythm: Normal rate and regular rhythm.     Heart sounds: Normal heart sounds.  Pulmonary:     Effort: Pulmonary effort is normal. No respiratory distress.     Breath sounds: Normal breath sounds.  Abdominal:     General: Bowel sounds are normal.     Palpations: Abdomen is soft. There is no hepatomegaly, splenomegaly or mass.     Tenderness: There is no abdominal tenderness. There is no guarding or rebound.     Hernia: No hernia is present.  Skin:    General: Skin is warm and dry.     Coloration: Skin is not jaundiced.     Findings: No rash.  Neurological:     General: No focal deficit present.     Mental Status: She is alert and oriented to person, place, and time.  Psychiatric:        Attention and Perception: Attention normal.        Mood and Affect: Mood normal.        Speech: Speech normal.        Behavior: Behavior normal.        Thought Content: Thought content normal.        Cognition and Memory:  Cognition and memory normal.       08/05/2019 11:30  AM   Disclaimer: This note was dictated with voice recognition software. Similar sounding words can inadvertently be transcribed and may not be corrected upon review.

## 2019-08-05 NOTE — Assessment & Plan Note (Signed)
Her hepatitis C serologies were finally completed done in [place] with [facility].  They have since referred her to infectious disease and she has an appointment scheduled for tomorrow for possible treatment.  Further recommendations per infectious disease.

## 2019-08-05 NOTE — Assessment & Plan Note (Signed)
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  No evidence of stones.  Cystic lesion in the pancreas as well.  No complex features.  The patient saw Dr. Corliss Parish in [place] who has scheduled her for an EUS and possible FNA in July.  Today she feels well.  No overt GI or hepatic complaints.  Her LFTs do seem somewhat improved at her last labs.  These are just completed about a week and a half ago so I will not redraw labs today.  Recommend she continue to follow-up with Dr. Meridee Score based on his recommendations, I stressed the importance of going to her procedural appointment.  Follow-up in our office in 6 months, call for any problems before then.

## 2019-08-05 NOTE — Patient Instructions (Signed)
Your health issues we discussed today were:   Abnormalities of your liver drainage ducts, liver enzymes, pancreas: 1. It is very important, as we discussed, to have your endoscopic ultrasound (EUS) completed in July 2. Follow-up with Dr. Meridee Score based on his recommendations 3. Call us if you have any worsening or severe symptoms  Weight loss: 1. I am glad your weight has stabilized and is now increasing 2. Continue to monitor your weight and notify us of any worsening or persistent weight loss  Hepatitis C: 1. Keep your appointment tomorrow with infectious disease for treatment of hepatitis C 2. Call us if you have any problems  Overall I recommend:  1. Continue your other current medications 2. Follow-up in 6 months 3. Call us if you have any questions or concerns   At [facility] we value your feedback. You may receive a survey about your visit today. Please share your experience as we strive to create trusting relationships with our patients to provide genuine, compassionate, quality care.  We appreciate your understanding and patience as we review any laboratory studies, imaging, and other diagnostic tests that are ordered as we care for you. Our office policy is 5 business days for review of these results, and any emergent or urgent results are addressed in a timely manner for your best interest. If you do not hear from our office in 1 week, please contact us.   We also encourage the use of MyChart, which contains your medical information for your review as well. If you are not enrolled in this feature, an access code is on this after visit summary for your convenience. Thank you for allowing [place] to be involved in your care.  It was great to see you today!  I hope you have a great Summer!!

## 2019-08-05 NOTE — Assessment & Plan Note (Signed)
Weight loss is stabilized and is now putting on weight.  Her weight today is stable and within a pound of her weight at her last office visit with [place].  Recommend she continue to monitor, notify us of any ongoing weight loss.  Follow-up in 6 months.

## 2019-08-06 ENCOUNTER — Encounter: Payer: Self-pay | Admitting: Family

## 2019-08-06 ENCOUNTER — Telehealth: Payer: Self-pay | Admitting: Pharmacy Technician

## 2019-08-06 ENCOUNTER — Ambulatory Visit (INDEPENDENT_AMBULATORY_CARE_PROVIDER_SITE_OTHER): Payer: Medicaid Other | Admitting: Family

## 2019-08-06 DIAGNOSIS — B182 Chronic viral hepatitis C: Secondary | ICD-10-CM

## 2019-08-06 MED ORDER — MAVYRET 100-40 MG PO TABS
3.0000 | ORAL_TABLET | Freq: Every day | ORAL | 1 refills | Status: DC
Start: 2019-08-06 — End: 2020-03-24

## 2019-08-06 NOTE — Patient Instructions (Signed)
Nice to meet you.  We will plan to treat you with [place].  Limit acetaminophen (Tylenol) usage to no more than 2 grams (2,000 mg) per day.  Avoid alcohol.  Do not share toothbrushes or razors.  Practice safe sex to protect against transmission as well as sexually transmitted disease.    Hepatitis C Hepatitis C is a viral infection of the liver. It can lead to scarring of the liver (cirrhosis), liver failure, or liver cancer. Hepatitis C may go undetected for months or years because people with the infection may not have symptoms, or they may have only mild symptoms. What are the causes? This condition is caused by the hepatitis C virus (HCV). The virus can spread from person to person (is contagious) through:  Blood.  Childbirth. A woman who has hepatitis C can pass it to her baby during birth.  Bodily fluids, such as breast milk, tears, semen, vaginal fluids, and saliva.  Blood transfusions or organ transplants done in the [place] before 1992.  What increases the risk? The following factors may make you more likely to develop this condition:  Having contact with unclean (contaminated) needles or syringes. This may result from: ? Acupuncture. ? Tattoing. ? Body piercing. ? Injecting drugs.  Having unprotected sex with someone who is infected.  Needing treatment to filter your blood (kidney dialysis).  Having HIV (human immunodeficiency virus) or AIDS (acquired immunodeficiency syndrome).  Working in a job that involves contact with blood or bodily fluids, such as health care.  What are the signs or symptoms? Symptoms of this condition include:  Fatigue.  Loss of appetite.  Nausea.  Vomiting.  Abdominal pain.  Dark yellow urine.  Yellowish skin and eyes (jaundice).  Itchy skin.  Clay-colored bowel movements.  Joint pain.  Bleeding and bruising easily.  Fluid building up in your stomach (ascites).  In some cases, you may not have any  symptoms. How is this diagnosed? This condition is diagnosed with:  Blood tests.  Other tests to check how well your liver is functioning. They may include: ? Magnetic resonance elastography (MRE). This imaging test uses MRIs and sound waves to measure liver stiffness. ? Transient elastography. This imaging test uses ultrasounds to measure liver stiffness. ? Liver biopsy. This test requires taking a small tissue sample from your liver to examine it under a microscope.  How is this treated? Your health care provider may perform noninvasive tests or a liver biopsy to help decide the best course of treatment. Treatment may include:  Antiviral medicines and other medicines.  Follow-up treatments every 6-12 months for infections or other liver conditions.  Receiving a donated liver (liver transplant).  Follow these instructions at home: Medicines  Take over-the-counter and prescription medicines only as told by your health care provider.  Take your antiviral medicine as told by your health care provider. Do not stop taking the antiviral even if you start to feel better.  Do not take any medicines unless approved by your health care provider, including over-the-counter medicines and birth control pills. Activity  Rest as needed.  Do not have sex unless approved by your health care provider.  Ask your health care provider when you may return to school or work. Eating and drinking  Eat a balanced diet with plenty of fruits and vegetables, whole grains, and lowfat (lean) meats or non-meat proteins (such as beans or tofu).  Drink enough fluids to keep your urine clear or pale yellow.  Do not drink alcohol. General  instructions  Do not share toothbrushes, nail clippers, or razors.  Wash your hands frequently with soap and water. If soap and water are not available, use hand sanitizer.  Cover any cuts or open sores on your skin to prevent spreading the virus.  Keep all follow-up  visits as told by your health care provider. This is important. You may need follow-up visits every 6-12 months. How is this prevented? There is no vaccine for hepatitis C. The only way to prevent the disease is to reduce the risk of exposure to the virus. Make sure you:  Wash your hands frequently with soap and water. If soap and water are not available, use hand sanitizer.  Do not share needles or syringes.  Practice safe sex and use condoms.  Avoid handling blood or bodily fluids without gloves or other protection.  Avoid getting tattoos or piercings in shops or other locations that are not clean.  Contact a health care provider if:  You have a fever.  You develop abdominal pain.  You pass dark urine.  You pass clay-colored stools.  You develop joint pain. Get help right away if:  You have increasing fatigue or weakness.  You lose your appetite.  You cannot eat or drink without vomiting.  You develop jaundice or your jaundice gets worse.  You bruise or bleed easily. Summary  Hepatitis C is a viral infection of the liver. It can lead to scarring of the liver (cirrhosis), liver failure, or liver cancer.  The hepatitis C virus (HCV) causes this condition. The virus can pass from person to person (is contagious).  You should not take any medicines unless approved by your health care provider. This includes over-the-counter medicines and birth control pills. This information is not intended to replace advice given to you by your health care provider. Make sure you discuss any questions you have with your health care provider. Document Released: 03/02/2000 Document Revised: 04/10/2016 Document Reviewed: 04/10/2016 Elsevier Interactive Patient Education  [employer].

## 2019-08-06 NOTE — Progress Notes (Signed)
Subjective:    Patient ID: Hannah Rhodes, female    DOB: 03-04-1981, 39 y.o.   MRN: [id number]  Chief Complaint  Patient presents with  . New Patient (Initial Visit)    Hep C    HPI:  Hannah Rhodes is a 39 y.o. female with previous medical history of chronic abdominal pain, pancreatic cyst, alcoholism, and positive hepatitis C antibody presenting today for evaluation and treatment of hepatitis C.  Hannah Rhodes recently completed blood work on 07/21/2019 showing genotype 1b hepatitis C with a viral load of 635,000.  AST elevated at 185 with ALT of 57.  MRI of the abdomen completed for possible pancreatic and left renal masses on ultrasound.  Results show no hepatic masses identified and mild diffuse hepatic steatosis.  There was a cystic lesion in the pancreatic head adjacent to the area of the common bile duct stricture with differentials including pancreatic pseudocyst or cystic pancreatic neoplasm.  She is now scheduled with gastroenterology for EUS and possible fine-needle aspiration.  First diagnosed in October of 2019 when she was seen in the ED for generalized abdominal pain. Risk factors include past IVDU and homemade tattoo. No blood transfusion prior to 1992, sharing of razors/toothbruses, or sexual contact with known positive partner. No treatment to date. No personal or family history of liver disease. Has chronic epigastric pain. No jaundice, scleral icterus, nausea, or fatigue. Currently drinks about 2-3 beers per sitting a couple of times per week. No current recreational or illicit drug use and smoke tobacco about 1/2 pack per day.    Allergies  Allergen Reactions  . Adhesive [Tape]       Outpatient Medications Prior to Visit  Medication Sig Dispense Refill  . amLODipine (NORVASC) 10 MG tablet Take 10 mg by mouth daily.    Marland Kitchen buPROPion (WELLBUTRIN XL) 300 MG 24 hr tablet Take 300 mg by mouth daily.     Marland Kitchen gabapentin (NEURONTIN) 100 MG capsule Take 1 capsule by mouth in the  morning and at bedtime.    . methocarbamol (ROBAXIN) 500 MG tablet Take 1 tablet (500 mg total) by mouth every 8 (eight) hours as needed for muscle spasms. 15 tablet 0  . nicotine (NICODERM CQ - DOSED IN MG/24 HOURS) 21 mg/24hr patch Place 21 mg onto the skin daily.    . metoprolol tartrate (LOPRESSOR) 50 MG tablet Take 1.5 tablets (75 mg total) by mouth 2 (two) times daily. 270 tablet 3   No facility-administered medications prior to visit.     Past Medical History:  Diagnosis Date  . Anxiety   . Chronic hepatitis C ([place])   . COPD (chronic obstructive pulmonary disease) ([place])   . Generalized headaches   . HPV (human papilloma virus) infection   . Hypertension   . Panic attack   . Tachycardia       Past Surgical History:  Procedure Laterality Date  . CESAREAN SECTION    . CESAREAN SECTION    . SMALL INTESTINE SURGERY       Family History  Problem Relation Age of Onset  . Anxiety disorder Sister   . Restless legs syndrome Sister   . Heart failure Mother   . CAD Mother   . Heart attack Mother   . Colon cancer Neg Hx   . Esophageal cancer Neg Hx   . Inflammatory bowel disease Neg Hx   . Liver disease Neg Hx   . Pancreatic cancer Neg Hx   . Stomach cancer Neg Hx   .  Rectal cancer Neg Hx       Social History   Socioeconomic History  . Marital status: Single    Spouse name: Not on file  . Number of children: 7  . Years of education: Not on file  . Highest education level: Not on file  Occupational History  . Occupation: Homemaker  Tobacco Use  . Smoking status: Current Every Day Smoker    Packs/day: 0.50    Types: Cigarettes  . Smokeless tobacco: Never Used  Substance and Sexual Activity  . Alcohol use: Yes    Comment: As of 08/05/19: on some weekends (typically 2-3 beers a sitting); Previously: ETOH abuse  . Drug use: Not Currently    Types: Marijuana, Heroin, Oxycodone, Hydrocodone    Comment: "only tried heroine twice"  . Sexual activity: Not  Currently  Other Topics Concern  . Not on file  Social History Narrative  . Not on file   Social Determinants of Health   Financial Resource Strain:   . Difficulty of Paying Living Expenses:   Food Insecurity:   . Worried About Charity fundraiser in the Last Year:   . Arboriculturist in the Last Year:   Transportation Needs:   . Film/video editor (Medical):   Marland Kitchen Lack of Transportation (Non-Medical):   Physical Activity:   . Days of Exercise per Week:   . Minutes of Exercise per Session:   Stress:   . Feeling of Stress :   Social Connections:   . Frequency of Communication with Friends and Family:   . Frequency of Social Gatherings with Friends and Family:   . Attends Religious Services:   . Active Member of Clubs or Organizations:   . Attends Archivist Meetings:   Marland Kitchen Marital Status:   Intimate Partner Violence:   . Fear of Current or Ex-Partner:   . Emotionally Abused:   Marland Kitchen Physically Abused:   . Sexually Abused:       Review of Systems  Constitutional: Negative for appetite change, chills, diaphoresis, fatigue, fever and unexpected weight change.  Eyes:       Negative for acute change in vision  Respiratory: Negative for chest tightness, shortness of breath and wheezing.   Cardiovascular: Negative for chest pain.  Gastrointestinal: Negative for diarrhea, nausea and vomiting.  Genitourinary: Negative for dysuria, pelvic pain and vaginal discharge.  Musculoskeletal: Negative for neck pain and neck stiffness.  Skin: Negative for rash.  Neurological: Negative for seizures, syncope, weakness and headaches.  Hematological: Negative for adenopathy. Does not bruise/bleed easily.  Psychiatric/Behavioral: Negative for hallucinations.       Objective:    BP 104/78   Pulse (!) 106   Temp 98.9 F (37.2 C)   Ht '4\' 11"'$  (1.499 m)   Wt 94 lb (42.6 kg)   SpO2 97%   BMI 18.99 kg/m  Nursing note and vital signs reviewed.  Physical Exam Constitutional:       General: She is not in acute distress.    Appearance: She is well-developed.  Eyes:     Conjunctiva/sclera: Conjunctivae normal.  Cardiovascular:     Rate and Rhythm: Normal rate and regular rhythm.     Heart sounds: Normal heart sounds. No murmur. No friction rub. No gallop.   Pulmonary:     Effort: Pulmonary effort is normal. No respiratory distress.     Breath sounds: Normal breath sounds. No wheezing or rales.  Chest:     Chest wall: No  tenderness.  Abdominal:     General: Bowel sounds are normal.     Palpations: Abdomen is soft.     Tenderness: There is no abdominal tenderness.  Musculoskeletal:     Cervical back: Neck supple.  Lymphadenopathy:     Cervical: No cervical adenopathy.  Skin:    General: Skin is warm and dry.     Findings: No rash.  Neurological:     Mental Status: She is alert and oriented to person, place, and time.  Psychiatric:        Behavior: Behavior normal.        Thought Content: Thought content normal.        Judgment: Judgment normal.        Assessment & Plan:   Patient Active Problem List   Diagnosis Date Noted  . Pancreatic cyst 07/22/2019  . Chronic abdominal pain 07/22/2019  . Abnormal LFTs 07/22/2019  . Abnormal MRI of abdomen 07/22/2019  . Bile duct stricture 07/22/2019  . Common bile duct dilation 07/22/2019  . Hepatitis C, chronic ([place]) 05/06/2019  . Abnormal ultrasound of abdomen 05/06/2019  . Hepatitis C antibody positive in blood 03/10/2019  . Alcoholism ([place]) 03/10/2019  . Loss of weight 03/10/2019  . DYSPNEA 10/24/2009  . ANXIETY STATE, UNSPECIFIED 10/21/2009  . NONDEPENDENT TOBACCO USE DISORDER 10/21/2009  . INSOMNIA UNSPECIFIED 10/21/2009  . PALPITATIONS 10/21/2009     Problem List Items Addressed This Visit      Digestive   Hepatitis C, chronic ([place])    Ms. Denise is a 39 year old female with genotype 1b hepatitis C with initial viral load of 635,000 likely obtained from IV drug use.  MRI abdomen with no  hepatic masses or lesions.  Has chronic abdominal pain likely associated with pancreatic lesion and is treatment nave.  We discussed the pathogenesis, transmission, prevention, risks if left untreated, and treatment options for hepatitis C.  She met with pharmacy staff today to complete financial assistance paperwork.  Plan for 8 weeks of Mavyret.  Follow-up pending medication initiation.      Relevant Medications   Glecaprevir-Pibrentasvir (MAVYRET) 100-40 MG TABS       I am having Montgomery Abbett start on Stor[place]. I am also having her maintain her nicotine, amLODipine, metoprolol tartrate, buPROPion, methocarbamol, and gabapentin.   Meds ordered this encounter  Medications  . Glecaprevir-Pibrentasvir (MAVYRET) 100-40 MG TABS    Sig: Take 3 tablets by mouth daily. Take 3 tablets by mouth daily with a meal.    Dispense:  84 tablet    Refill:  1    Order Specific Question:   Supervising Provider    Answer:   Carlyle Basques [[id number]]     Follow-up: 1 month after starting medication.     Terri Piedra, MSN, FNP-C Nurse Practitioner [facility] for Infectious Disease [place] number: [phone]

## 2019-08-06 NOTE — Telephone Encounter (Addendum)
[facility] Patient Advocate Encounter    Findings of the benefits investigation:   Insurance: [employer]- active Estimated copay amount: $3.00 Prior Authorization: will begin on nctracks  Readiness form signed and will begin prior authorization process with her most recent labs.  Beulah Gandy, CPhT Specialty Pharmacy Patient [facility] for Infectious Disease Phone: [phone] Fax: [phone] 08/06/2019 9:56 AM

## 2019-08-06 NOTE — Assessment & Plan Note (Signed)
Ms. Hannah Rhodes is a 39 year old female with genotype 1b hepatitis C with initial viral load of 635,000 likely obtained from IV drug use.  MRI abdomen with no hepatic masses or lesions.  Has chronic abdominal pain likely associated with pancreatic lesion and is treatment nave.  We discussed the pathogenesis, transmission, prevention, risks if left untreated, and treatment options for hepatitis C.  She met with pharmacy staff today to complete financial assistance paperwork.  Plan for 8 weeks of Mavyret.  Follow-up pending medication initiation.

## 2019-08-10 ENCOUNTER — Telehealth: Payer: Self-pay | Admitting: Pharmacy Technician

## 2019-08-10 NOTE — Telephone Encounter (Signed)
[facility] Patient Advocate Encounter   Received notification from [employer] that prior authorization for Mavyret is required.   PA submitted on 08/10/2019 Key [id number] W Status is pending on [place] tracks    [facility] will continue to follow.  Hannah Rhodes, CPhT Specialty Pharmacy Patient [facility] for Infectious Disease Phone: [phone] Fax: [phone] 08/10/2019 1:02 PM

## 2019-08-13 ENCOUNTER — Encounter: Payer: Self-pay | Admitting: Pharmacy Technician

## 2019-08-13 ENCOUNTER — Telehealth: Payer: Self-pay | Admitting: Pharmacist

## 2019-08-13 MED FILL — MAVYRET 100-40 MG TABS: 100-40 | 28 days supply | Qty: 84 | Fill #0

## 2019-08-13 NOTE — Telephone Encounter (Cosign Needed)
I spoke with Hannah Rhodes this morning about her new medication Mavyret. She will start Mavyret 3 tablets once daily with food tomorrow, 08/14/2019 and will continue for a total of 8 weeks. She should be receiving the medication in the mail later today or tomorrow morning. Discussed the importance of medication adherence and not missing any doses in order to achieve SVR. Advised her to take Mavyret around the same time everyday. Counseled patient on potential side-effects including fatigue, headache and nausea and advised her to take Mavyret with food to help with nausea.  Also instructed patient to call Cassie if she starts any new medications. Patient verbalized understanding. Patient has a history of chronic alcoholism and she agreed that she will limit/stop her alcohol intake. All of her questions and concerns were addressed at this time. She will follow up with Cassie on 6/28 at 1:45PM.   Fabio Neighbors, PharmD PGY1 Ambulatory Care Resident

## 2019-08-13 NOTE — Telephone Encounter (Signed)
[facility] Patient Advocate Encounter  Prior Authorization for Mavyret has been approved.    PA# [id number] Effective dates: 08/10/2019 through 10/09/2019  Patients co-pay is $3.00   [facility] will continue to follow.  Beulah Gandy, CPhT Specialty Pharmacy Patient [facility] for Infectious Disease Phone: [phone] Fax: [phone] 08/13/2019 10:16 AM

## 2019-09-03 ENCOUNTER — Other Ambulatory Visit (HOSPITAL_COMMUNITY): Payer: Medicaid Other

## 2019-09-07 MED FILL — MAVYRET 100-40 MG TABS: 100-40 | 28 days supply | Qty: 84 | Fill #1

## 2019-09-14 ENCOUNTER — Ambulatory Visit: Payer: Medicaid Other | Admitting: Pharmacist

## 2019-09-24 ENCOUNTER — Encounter: Payer: Self-pay | Admitting: Emergency Medicine

## 2019-09-24 ENCOUNTER — Other Ambulatory Visit (HOSPITAL_COMMUNITY)
Admit: 2019-09-24 | Discharge: 2019-09-24 | Disposition: A | Discharge status: Home / Self Care | Payer: Medicaid Other | Attending: Gastroenterology | Admitting: Gastroenterology

## 2019-09-24 ENCOUNTER — Ambulatory Visit
Admission: EM | Admit: 2019-09-24 | Discharge: 2019-09-24 | Disposition: A | Discharge status: Home / Self Care | Payer: Medicaid Other | Attending: Emergency Medicine | Admitting: Emergency Medicine

## 2019-09-24 DIAGNOSIS — Z20822 Contact with and (suspected) exposure to covid-19: Secondary | ICD-10-CM | POA: Diagnosis not present

## 2019-09-24 DIAGNOSIS — Z01812 Encounter for preprocedural laboratory examination: Secondary | ICD-10-CM

## 2019-09-24 NOTE — ED Triage Notes (Signed)
Needs covid test done for a procedure she is having on Monday

## 2019-09-24 NOTE — Discharge Instructions (Addendum)

## 2019-09-24 NOTE — ED Provider Notes (Signed)
[facility]   [id number] 09/24/19 Arrival Time: 1010   Chief Complaint  Patient presents with  . Covid Exposure     SUBJECTIVE: History from: patient.  Hannah Rhodes is a 39 y.o. female .who presents for COVID testing for the procedure.  Denies sick exposure to COVID, flu or strep.  Denies recent travel.  Denies aggravating or alleviating symptoms.  Denies previous COVID infection.   Denies fever, chills, fatigue, nasal congestion, rhinorrhea, sore throat, cough, SOB, wheezing, chest pain, nausea, vomiting, changes in bowel or bladder habits.     ROS: As per HPI.  All other pertinent ROS negative.       Past Medical History:  Diagnosis Date  . Anxiety   . Chronic hepatitis C (HCC)   . COPD (chronic obstructive pulmonary disease) (HCC)   . Generalized headaches   . HPV (human papilloma virus) infection   . Hypertension   . Panic attack   . Tachycardia    Past Surgical History:  Procedure Laterality Date  . CESAREAN SECTION    . CESAREAN SECTION    . SMALL INTESTINE SURGERY     Allergies  Allergen Reactions  . Adhesive [Tape] Rash    Tears skin   No current facility-administered medications on file prior to encounter.   Current Outpatient Medications on File Prior to Encounter  Medication Sig Dispense Refill  . amLODipine (NORVASC) 10 MG tablet Take 10 mg by mouth daily.    Marland Kitchen buPROPion (WELLBUTRIN XL) 300 MG 24 hr tablet Take 300 mg by mouth daily as needed (anxiety).     . gabapentin (NEURONTIN) 100 MG capsule Take 100 mg by mouth 3 (three) times daily as needed (pain).     . Glecaprevir-Pibrentasvir (MAVYRET) 100-40 MG TABS Take 3 tablets by mouth daily. Take 3 tablets by mouth daily with a meal. 84 tablet 1  . melatonin 5 MG TABS Take 5 mg by mouth at bedtime as needed (sleep).    . methocarbamol (ROBAXIN) 500 MG tablet Take 1 tablet (500 mg total) by mouth every 8 (eight) hours as needed for muscle spasms. 15 tablet 0  . metoprolol tartrate (LOPRESSOR)  50 MG tablet Take 1.5 tablets (75 mg total) by mouth 2 (two) times daily. 270 tablet 3  . nicotine (NICODERM CQ - DOSED IN MG/24 HOURS) 21 mg/24hr patch Place 21 mg onto the skin daily.     Social History   Socioeconomic History  . Marital status: Single    Spouse name: Not on file  . Number of children: 7  . Years of education: Not on file  . Highest education level: Not on file  Occupational History  . Occupation: Homemaker  Tobacco Use  . Smoking status: Current Every Day Smoker    Packs/day: 0.50    Types: Cigarettes  . Smokeless tobacco: Never Used  Vaping Use  . Vaping Use: Former  Substance and Sexual Activity  . Alcohol use: Yes    Comment: As of 08/05/19: on some weekends (typically 2-3 beers a sitting); Previously: ETOH abuse  . Drug use: Not Currently    Types: Marijuana, Heroin, Oxycodone, Hydrocodone    Comment: "only tried heroine twice"  . Sexual activity: Not Currently  Other Topics Concern  . Not on file  Social History Narrative  . Not on file   Social Determinants of Health   Financial Resource Strain:   . Difficulty of Paying Living Expenses:   Food Insecurity:   . Worried About Radiation protection practitioner  of Food in the Last Year:   . Ran Out of Food in the Last Year:   Transportation Needs:   . Lack of Transportation (Medical):   Marland Kitchen Lack of Transportation (Non-Medical):   Physical Activity:   . Days of Exercise per Week:   . Minutes of Exercise per Session:   Stress:   . Feeling of Stress :   Social Connections:   . Frequency of Communication with Friends and Family:   . Frequency of Social Gatherings with Friends and Family:   . Attends Religious Services:   . Active Member of Clubs or Organizations:   . Attends Banker Meetings:   Marland Kitchen Marital Status:   Intimate Partner Violence:   . Fear of Current or Ex-Partner:   . Emotionally Abused:   Marland Kitchen Physically Abused:   . Sexually Abused:    Family History  Problem Relation Age of Onset  . Anxiety  disorder Sister   . Restless legs syndrome Sister   . Heart failure Mother   . CAD Mother   . Heart attack Mother   . Colon cancer Neg Hx   . Esophageal cancer Neg Hx   . Inflammatory bowel disease Neg Hx   . Liver disease Neg Hx   . Pancreatic cancer Neg Hx   . Stomach cancer Neg Hx   . Rectal cancer Neg Hx     OBJECTIVE:  Vitals:   09/24/19 1019 09/24/19 1020  BP: 122/77   Pulse: 81   Resp: 17   Temp: 98.6 F (37 C)   TempSrc: Oral   SpO2: 97%   Weight:  95 lb (43.1 kg)  Height:  4\' 10"  (1.473 m)     General appearance: alert; appears fatigued, but nontoxic; speaking in full sentences and tolerating own secretions HEENT: NCAT; Ears: EACs clear, TMs pearly gray; Eyes: PERRL.  EOM grossly intact. Sinuses: nontender; Nose: nares patent without rhinorrhea, Throat: oropharynx clear, tonsils non erythematous or enlarged, uvula midline  Neck: supple without LAD Lungs: unlabored respirations, symmetrical air entry; cough: absent; no respiratory distress; CTAB Heart: regular rate and rhythm.  Radial pulses 2+ symmetrical bilaterally Skin: warm and dry Psychological: alert and cooperative; normal mood and affect  LABS:  No results found for this or any previous visit (from the past 24 hour(s)).   ASSESSMENT & PLAN:  1. Encounter for preoperative screening laboratory testing for COVID-19 virus     No orders of the defined types were placed in this encounter.  Discharge Instructions  COVID testing ordered.  It will take between 2-7 days for test results.  Someone will contact you regarding abnormal results.    In the meantime: You should remain isolated in your home for 10 days from symptom onset AND greater than 24 hours after symptoms resolution (absence of fever without the use of fever-reducing medication and improvement in respiratory symptoms), whichever is longer Get plenty of rest and push fluids Use medications daily for symptom relief Use OTC medications like  ibuprofen or tylenol as needed fever or pain Call or go to the ED if you have any new or worsening symptoms such as fever, worsening cough, shortness of breath, chest tightness, chest pain, turning blue, changes in mental status, etc...   Reviewed expectations re: course of current medical issues. Questions answered. Outlined signs and symptoms indicating need for more acute intervention. Patient verbalized understanding. After Visit Summary given.      Note: This document was prepared using  software and may include unintentional dictation errors.    Durward Parcel, FNP 09/24/19 1031

## 2019-09-24 NOTE — Progress Notes (Addendum)
Patient went to Urgent care for her preoperative screening laboratory testing for COVID-19 virus. Please refer to Theresia Majors FNP ov notes. Nothing further needed.

## 2019-09-25 ENCOUNTER — Encounter (HOSPITAL_COMMUNITY): Payer: Self-pay | Admitting: Gastroenterology

## 2019-09-25 LAB — NOVEL CORONAVIRUS, NAA: SARS-CoV-2, NAA: NOT DETECTED

## 2019-09-25 LAB — SARS-COV-2, NAA 2 DAY TAT

## 2019-09-25 NOTE — Progress Notes (Signed)
Anesthesia Chart Review: SAME DAY WORK-UP (ENDO)    Case: [id number] Date/Time: 09/28/19 0730   Procedure: UPPER ESOPHAGEAL ENDOSCOPIC ULTRASOUND (EUS) (N/A )   Anesthesia type: Monitor Anesthesia Care   Pre-op diagnosis: Bile Duct Dilation, Abnormal LFT's Chronic abd pain, Hepatitis C   [facility]: [facility] ROOM [facility] / [facility]   Surgeons: Mansouraty, Netty Starring., MD      DISCUSSION: Patient is a 39 year old female scheduled for the above procedure. 08/05/19 GI notes indicate that she had an "Abnormal abdominal MRI with relatively new biliary ductal dilation with CBD measuring 10 mm with a smooth tapering to a severe stricture of the intrapancreatic portion of the CBD.  No evidence of stones.  Cystic lesion in the pancreas as well.  No complex features.  The patient saw Dr. Corliss Parish in [place] who has scheduled her for an EUS and possible FNA in July."  History includes smoking, anxiety with panic attacks, HTN, chronic hepatitis C (started on Mavyret 08/14/19 and prescribed for 8 weeks), fatty liver, alcoholism, tachycardia, COPD, GERD, small intestine surgery. Former illicit drug use (including cocaine, marijuana, oxycodone, hydrocodone, and "tried heroine twice"; denied illicit drug use since late 2020). Social history documentation indicates that she has been sober from alcohol since 09/20/19 (after what sounds like a relapse since May 2021).  Labs over the past six months reveal chronically elevated LFTs without thrombocytopenia.  She went to ED on 09/24/19 for COVID-19 testing for procedure--test is still in process. Anesthesia team to evaluate on the day of surgery.   VS: Ht 4\' 10"  (1.473 m)   Wt 43.1 kg   LMP  (LMP Unknown) Comment: Last one in May 2021  BMI 19.86 kg/m   BP Readings from Last 3 Encounters:  09/24/19 122/77  08/06/19 104/78  08/05/19 126/86     PROVIDERS: 08/07/19, MD is PCP [facility], see Care Everywhere) - [facility], [facility], MD  is GI - Casimiro Needle, FNP with ID on 08/06/19 for chronic hepatitis C treatment. 08/08/19, MD is cardiologist. Last tele visit 04/09/19 with one year follow-up planned.   LABS: Labs per GI/Anesthesia. Review of labs over the past six months show AST 128-231, ALT 42-63 (known alcohol abuse, hepatitis C). PLT count 287, H/H 11.6/34.1, glucose 125 on 07/24/19.     IMAGES: 1V CXR 07/24/19: FINDINGS: The heart size and mediastinal contours are within normal limits. The lungs are clear. The visualized skeletal structures are unremarkable. IMPRESSION: Negative chest.  MRI Abd 05/21/19 (done due to possible pancreatic and left renal mass on 07/21/19): IMPRESSION: - New 17 mm simple appearing cystic lesion in the pancreatic head compared to prior CT in 2019. Differential diagnosis includes pancreatic pseudocyst and cystic pancreatic neoplasm. Consider further evaluation with EUS/FNA. - Mild biliary ductal dilatation, with benign-appearing stricture of the intrapancreatic portion of the common bile duct. This shows no significant change compared to previous CT. This could also be evaluated by ERCP. - Mild hepatic steatosis. - Small benign-appearing left renal cyst. No evidence of renal neoplasm.   EKG: 07/24/19 Ventricular rate 101 bpm Sinus tachycardia with occasional and consecutive Premature ventricular complexes Biatrial enlargement Low voltage QRS Abnormal ECG Confirmed by 09/23/19 [id number]) on 07/27/2019 1:25:02 PM   CV: ETT 01/10/17:  Study Highlights Blood pressure demonstrated a hypertensive response to exercise.  Good exercise tolerance (achieved 9 minutes)  ECG uninterpretable with stress due to excessive artifact, in spite of multiple different maneuvers employed.  Immediate post-stress ECG was without ischemic abnormalities.  Clinical correlation recommended.  No chest pain during study. - Notes recorded by Laqueta Linden, MD on 01/11/2017 at  12:41 AM EDT "Very good exercise tolerance without symptoms suspicious for heart disease."   Past Medical History:  Diagnosis Date  . Alcoholism (HCC)   . Anxiety   . Chronic hepatitis C (HCC)   . COPD (chronic obstructive pulmonary disease) (HCC)    no inaler  . DDD (degenerative disc disease), lumbar   . Fatty liver   . Generalized headaches   . GERD (gastroesophageal reflux disease)    diet controlled, no med  . HPV (human papilloma virus) infection   . Hypertension   . Insomnia   . Panic attack   . Pneumonia 03/2010  . Smoker   . Tachycardia     Past Surgical History:  Procedure Laterality Date  . CESAREAN SECTION    . CESAREAN SECTION    . SMALL INTESTINE SURGERY    . WISDOM TOOTH EXTRACTION      MEDICATIONS: No current facility-administered medications for this encounter.   Marland Kitchen amLODipine (NORVASC) 10 MG tablet  . buPROPion (WELLBUTRIN XL) 300 MG 24 hr tablet  . gabapentin (NEURONTIN) 100 MG capsule  . Glecaprevir-Pibrentasvir (MAVYRET) 100-40 MG TABS  . melatonin 5 MG TABS  . methocarbamol (ROBAXIN) 500 MG tablet  . metoprolol tartrate (LOPRESSOR) 50 MG tablet  . nicotine (NICODERM CQ - DOSED IN MG/24 HOURS) 21 mg/24hr patch    Shonna Chock, PA-C Surgical Short Stay/Anesthesiology [facility] Phone [phone] [facility] Phone [phone] 09/25/2019 2:53 PM

## 2019-09-25 NOTE — Anesthesia Preprocedure Evaluation (Addendum)
Anesthesia Evaluation  Patient identified by MRN, date of birth, ID band Patient awake    Reviewed: Allergy & Precautions, NPO status , Patient's Chart, lab work & pertinent test results  Airway Mallampati: I  TM Distance: >3 FB Neck ROM: Full    Dental no notable dental hx. (+) Edentulous Upper, Edentulous Lower   Pulmonary shortness of breath, COPD, Current Smoker and Patient abstained from smoking.,    Pulmonary exam normal breath sounds clear to auscultation       Cardiovascular hypertension, Pt. on medications and Pt. on home beta blockers Normal cardiovascular exam Rhythm:Regular Rate:Normal     Neuro/Psych  Headaches, PSYCHIATRIC DISORDERS Anxiety    GI/Hepatic GERD  ,(+)     substance abuse  alcohol use, Hepatitis -, CHx of substance use   Endo/Other  negative endocrine ROS  Renal/GU negative Renal ROS     Musculoskeletal   Abdominal   Peds  Hematology negative hematology ROS (+)   Anesthesia Other Findings   Reproductive/Obstetrics                            Anesthesia Physical Anesthesia Plan  ASA: III  Anesthesia Plan: MAC   Post-op Pain Management:    Induction: Intravenous  PONV Risk Score and Plan: Treatment may vary due to age or medical condition  Airway Management Planned: Nasal Cannula and Natural Airway  Additional Equipment: None  Intra-op Plan:   Post-operative Plan:   Informed Consent: I have reviewed the patients History and Physical, chart, labs and discussed the procedure including the risks, benefits and alternatives for the proposed anesthesia with the patient or authorized representative who has indicated his/her understanding and acceptance.     Dental advisory given  Plan Discussed with:   Anesthesia Plan Comments: (PAT note written 09/25/2019 by Myra Gianotti, PA-C. )      Anesthesia Quick Evaluation

## 2019-09-25 NOTE — Progress Notes (Signed)
Patient denies shortness of breath, fever, cough or chest pain.  PCP - Dr Sundra Aland Cardiologist - n/a  Chest x-ray - 07/24/19 (1V) EKG - 07/27/19 Stress Test - 01/10/17 ECHO - n/a Cardiac Cath - n/a  Anesthesia review: Yes  STOP now taking any Aspirin (unless otherwise instructed by your surgeon), Aleve, Naproxen, Ibuprofen, Motrin, Advil, Goody's, BC's, all herbal medications, fish oil, and all vitamins.   Coronavirus Screening Results pending on 09/24/19 Covid test. Do you have any of the following symptoms:  Cough yes/no: No Fever (>100.32F)  yes/no: No Runny nose yes/no: No Sore throat yes/no: No Difficulty breathing/shortness of breath  yes/no: No  Have you traveled in the last 14 days and where? yes/no: No  Patient verbalized understanding of instructions that were given via phone.

## 2019-09-28 ENCOUNTER — Ambulatory Visit (HOSPITAL_COMMUNITY): Payer: Medicaid Other | Admitting: Vascular Surgery

## 2019-09-28 ENCOUNTER — Encounter (HOSPITAL_COMMUNITY)
Admission: RE | Disposition: A | Discharge status: Home / Self Care | Payer: Self-pay | Source: Home / Self Care | Attending: Gastroenterology

## 2019-09-28 ENCOUNTER — Other Ambulatory Visit: Payer: Self-pay

## 2019-09-28 ENCOUNTER — Ambulatory Visit (HOSPITAL_COMMUNITY)
Admission: RE | Admit: 2019-09-28 | Discharge: 2019-09-28 | Disposition: A | Discharge status: Home / Self Care | Payer: Medicaid Other | Attending: Gastroenterology | Admitting: Gastroenterology

## 2019-09-28 ENCOUNTER — Other Ambulatory Visit: Payer: Self-pay | Admitting: Physician Assistant

## 2019-09-28 ENCOUNTER — Encounter (HOSPITAL_COMMUNITY): Payer: Self-pay | Admitting: Gastroenterology

## 2019-09-28 ENCOUNTER — Encounter: Payer: Self-pay | Admitting: Gastroenterology

## 2019-09-28 DIAGNOSIS — K3189 Other diseases of stomach and duodenum: Secondary | ICD-10-CM | POA: Insufficient documentation

## 2019-09-28 DIAGNOSIS — N281 Cyst of kidney, acquired: Secondary | ICD-10-CM | POA: Insufficient documentation

## 2019-09-28 DIAGNOSIS — G8929 Other chronic pain: Secondary | ICD-10-CM | POA: Diagnosis not present

## 2019-09-28 DIAGNOSIS — I10 Essential (primary) hypertension: Secondary | ICD-10-CM | POA: Insufficient documentation

## 2019-09-28 DIAGNOSIS — K2980 Duodenitis without bleeding: Secondary | ICD-10-CM | POA: Insufficient documentation

## 2019-09-28 DIAGNOSIS — R109 Unspecified abdominal pain: Secondary | ICD-10-CM

## 2019-09-28 DIAGNOSIS — J449 Chronic obstructive pulmonary disease, unspecified: Secondary | ICD-10-CM | POA: Insufficient documentation

## 2019-09-28 DIAGNOSIS — Z8249 Family history of ischemic heart disease and other diseases of the circulatory system: Secondary | ICD-10-CM | POA: Diagnosis not present

## 2019-09-28 DIAGNOSIS — K2100 Gastro-esophageal reflux disease with esophagitis, without bleeding: Secondary | ICD-10-CM | POA: Insufficient documentation

## 2019-09-28 DIAGNOSIS — K862 Cyst of pancreas: Secondary | ICD-10-CM | POA: Diagnosis present

## 2019-09-28 DIAGNOSIS — K760 Fatty (change of) liver, not elsewhere classified: Secondary | ICD-10-CM | POA: Diagnosis not present

## 2019-09-28 DIAGNOSIS — Z888 Allergy status to other drugs, medicaments and biological substances status: Secondary | ICD-10-CM | POA: Insufficient documentation

## 2019-09-28 DIAGNOSIS — K209 Esophagitis, unspecified without bleeding: Secondary | ICD-10-CM

## 2019-09-28 DIAGNOSIS — K838 Other specified diseases of biliary tract: Secondary | ICD-10-CM | POA: Diagnosis not present

## 2019-09-28 DIAGNOSIS — Z79899 Other long term (current) drug therapy: Secondary | ICD-10-CM | POA: Insufficient documentation

## 2019-09-28 DIAGNOSIS — B182 Chronic viral hepatitis C: Secondary | ICD-10-CM | POA: Insufficient documentation

## 2019-09-28 DIAGNOSIS — R7989 Other specified abnormal findings of blood chemistry: Secondary | ICD-10-CM

## 2019-09-28 DIAGNOSIS — K766 Portal hypertension: Secondary | ICD-10-CM | POA: Diagnosis not present

## 2019-09-28 DIAGNOSIS — F17210 Nicotine dependence, cigarettes, uncomplicated: Secondary | ICD-10-CM | POA: Insufficient documentation

## 2019-09-28 HISTORY — DX: Other intervertebral disc degeneration, lumbar region without mention of lumbar back pain or lower extremity pain: M51.369

## 2019-09-28 HISTORY — DX: Other intervertebral disc degeneration, lumbar region: M51.36

## 2019-09-28 HISTORY — DX: Nicotine dependence, unspecified, uncomplicated: F17.200

## 2019-09-28 HISTORY — DX: Fatty (change of) liver, not elsewhere classified: K76.0

## 2019-09-28 HISTORY — DX: Insomnia, unspecified: G47.00

## 2019-09-28 HISTORY — PX: BIOPSY: SHX5522

## 2019-09-28 HISTORY — PX: ESOPHAGOGASTRODUODENOSCOPY (EGD) WITH PROPOFOL: SHX5813

## 2019-09-28 HISTORY — PX: EUS: SHX5427

## 2019-09-28 HISTORY — DX: Alcohol dependence, uncomplicated: F10.20

## 2019-09-28 HISTORY — DX: Gastro-esophageal reflux disease without esophagitis: K21.9

## 2019-09-28 HISTORY — PX: FINE NEEDLE ASPIRATION: SHX5430

## 2019-09-28 LAB — POCT PREGNANCY, URINE: Preg Test, Ur: NEGATIVE

## 2019-09-28 SURGERY — ESOPHAGOGASTRODUODENOSCOPY (EGD) WITH PROPOFOL
Anesthesia: Monitor Anesthesia Care

## 2019-09-28 MED ORDER — GLUCAGON HCL RDNA (DIAGNOSTIC) 1 MG IJ SOLR
INTRAMUSCULAR | Status: AC
  Filled 2019-09-28: qty 1

## 2019-09-28 MED ORDER — LACTATED RINGERS IV SOLN
INTRAVENOUS | Status: AC | PRN
  Administered 2019-09-28: 1000 mL via INTRAVENOUS

## 2019-09-28 MED ORDER — PROPOFOL 10 MG/ML IV BOLUS
INTRAVENOUS | Status: DC | PRN
  Administered 2019-09-28: 20 mg via INTRAVENOUS
  Administered 2019-09-28: 40 mg via INTRAVENOUS
  Administered 2019-09-28 (×4): 20 mg via INTRAVENOUS

## 2019-09-28 MED ORDER — CIPROFLOXACIN IN D5W 400 MG/200ML IV SOLN
400.0000 mg | Freq: Once | INTRAVENOUS | Status: AC
  Administered 2019-09-28: 400 mg via INTRAVENOUS

## 2019-09-28 MED ORDER — GLUCAGON HCL RDNA (DIAGNOSTIC) 1 MG IJ SOLR
INTRAMUSCULAR | Status: DC | PRN
Start: 2019-09-28 — End: 2019-09-28
  Administered 2019-09-28: .25 mg via INTRAVENOUS

## 2019-09-28 MED ORDER — OMEPRAZOLE 40 MG PO CPDR: 40.0000 mg | DELAYED_RELEASE_CAPSULE | Freq: Every day | ORAL | 6 refills | Status: DC

## 2019-09-28 MED ORDER — PROPOFOL 500 MG/50ML IV EMUL
INTRAVENOUS | Status: DC | PRN
  Administered 2019-09-28: 125 ug/kg/min via INTRAVENOUS

## 2019-09-28 MED ORDER — GLYCOPYRROLATE 0.2 MG/ML IJ SOLN
INTRAMUSCULAR | Status: DC | PRN
  Administered 2019-09-28: .1 mg via INTRAVENOUS

## 2019-09-28 MED ORDER — LIDOCAINE 2% (20 MG/ML) 5 ML SYRINGE
INTRAMUSCULAR | Status: DC | PRN
  Administered 2019-09-28: 100 mg via INTRAVENOUS

## 2019-09-28 MED ORDER — CIPROFLOXACIN IN D5W 400 MG/200ML IV SOLN
INTRAVENOUS | Status: AC
  Filled 2019-09-28: qty 200

## 2019-09-28 MED ORDER — SODIUM CHLORIDE 0.9 % IV SOLN: INTRAVENOUS | Status: DC

## 2019-09-28 MED ORDER — CIPROFLOXACIN HCL 500 MG PO TABS: 500.0000 mg | ORAL_TABLET | Freq: Two times a day (BID) | ORAL | 0 refills | Status: AC

## 2019-09-28 NOTE — Anesthesia Postprocedure Evaluation (Signed)
Anesthesia Post Note  Patient: Hannah Rhodes  Procedure(s) Performed: ESOPHAGOGASTRODUODENOSCOPY (EGD) WITH PROPOFOL (N/A ) BIOPSY UPPER ENDOSCOPIC ULTRASOUND (EUS) LINEAR FULL UPPER ENDOSCOPIC ULTRASOUND (EUS) RADIAL FINE NEEDLE ASPIRATION (FNA) LINEAR     Patient location during evaluation: Endoscopy Anesthesia Type: MAC Level of consciousness: awake and alert Pain management: pain level controlled Vital Signs Assessment: post-procedure vital signs reviewed and stable Respiratory status: spontaneous breathing, nonlabored ventilation, respiratory function stable and patient connected to nasal cannula oxygen Cardiovascular status: blood pressure returned to baseline and stable Postop Assessment: no apparent nausea or vomiting Anesthetic complications: no   No complications documented.  Last Vitals:  Vitals:   09/28/19 0900 09/28/19 0920  BP: (!) 117/91 132/85  Pulse: 85 79  Resp: 18 12  Temp:  (!) 36.4 C  SpO2: 100% 100%    Last Pain:  Vitals:   09/28/19 0920  TempSrc:   PainSc: 0-No pain                 Barnet Glasgow

## 2019-09-28 NOTE — H&P (Signed)
GASTROENTEROLOGY PROCEDURE H&P NOTE   Primary Care Physician: Suzan Slick, MD  HPI: Hannah Rhodes is a 39 y.o. female who presents for EGD/EUS to evaluate dilated biliary tree, abnormal LFTs, chronic abdominal pain, pancreatic cyst, rule out chronic pancreatitis.  Past Medical History:  Diagnosis Date  . Alcoholism (HCC)   . Anxiety   . Chronic hepatitis C (HCC)   . COPD (chronic obstructive pulmonary disease) (HCC)    no inaler  . DDD (degenerative disc disease), lumbar   . Fatty liver   . Generalized headaches   . GERD (gastroesophageal reflux disease)    diet controlled, no med  . HPV (human papilloma virus) infection   . Hypertension   . Insomnia   . Panic attack   . Pneumonia 03/2010  . Smoker   . Tachycardia    Past Surgical History:  Procedure Laterality Date  . CESAREAN SECTION    . CESAREAN SECTION    . SMALL INTESTINE SURGERY    . WISDOM TOOTH EXTRACTION     Current Facility-Administered Medications  Medication Dose Route Frequency Provider Last Rate Last Admin  . 0.9 %  sodium chloride infusion   Intravenous Continuous Mansouraty, Netty Starring., MD      . lactated ringers infusion    Continuous PRN Mansouraty, Netty Starring., MD   1,000 mL at 09/28/19 8841   Allergies  Allergen Reactions  . Adhesive [Tape] Rash    Tears skin Ok with paper tape   Family History  Problem Relation Age of Onset  . Anxiety disorder Sister   . Restless legs syndrome Sister   . Heart failure Mother   . CAD Mother   . Heart attack Mother   . Colon cancer Neg Hx   . Esophageal cancer Neg Hx   . Inflammatory bowel disease Neg Hx   . Liver disease Neg Hx   . Pancreatic cancer Neg Hx   . Stomach cancer Neg Hx   . Rectal cancer Neg Hx    Social History   Socioeconomic History  . Marital status: Single    Spouse name: Not on file  . Number of children: 7  . Years of education: Not on file  . Highest education level: Not on file  Occupational History  . Occupation:  Homemaker  Tobacco Use  . Smoking status: Current Every Day Smoker    Packs/day: 0.50    Years: 24.00    Pack years: 12.00    Types: Cigarettes  . Smokeless tobacco: Never Used  . Tobacco comment: Cut back to 5 cig/day  Vaping Use  . Vaping Use: Former  Substance and Sexual Activity  . Alcohol use: Yes    Comment: As of 08/05/19: on some weekends (typically 2-3 beers a sitting); Previously: ETOH abuse, No ETOH since 09/20/19  . Drug use: Not Currently    Types: Marijuana, Heroin, Oxycodone, Hydrocodone, Cocaine    Comment: "only tried heroine twice" Last use end of 2020  . Sexual activity: Not Currently  Other Topics Concern  . Not on file  Social History Narrative  . Not on file   Social Determinants of Health   Financial Resource Strain:   . Difficulty of Paying Living Expenses:   Food Insecurity:   . Worried About Programme researcher, broadcasting/film/video in the Last Year:   . Barista in the Last Year:   Transportation Needs:   . Freight forwarder (Medical):   Marland Kitchen Lack of Transportation (Non-Medical):  Physical Activity:   . Days of Exercise per Week:   . Minutes of Exercise per Session:   Stress:   . Feeling of Stress :   Social Connections:   . Frequency of Communication with Friends and Family:   . Frequency of Social Gatherings with Friends and Family:   . Attends Religious Services:   . Active Member of Clubs or Organizations:   . Attends Banker Meetings:   Marland Kitchen Marital Status:   Intimate Partner Violence:   . Fear of Current or Ex-Partner:   . Emotionally Abused:   Marland Kitchen Physically Abused:   . Sexually Abused:     Physical Exam: Vital signs in last 24 hours: Temp:  [98.7 F (37.1 C)] 98.7 F (37.1 C) (07/12 0641) Resp:  [16] 16 (07/12 0641) BP: (117)/(82) 117/82 (07/12 0641) SpO2:  [98 %] 98 % (07/12 0641) Weight:  [42.2 kg] 42.2 kg (07/12 0641)   GEN: NAD EYE: Sclerae anicteric ENT: MMM CV: Non-tachycardic GI: Soft, TTP in abdomen noted NEURO:   Alert & Oriented x 3  Lab Results: No results for input(s): WBC, HGB, HCT, PLT in the last 72 hours. BMET No results for input(s): NA, K, CL, CO2, GLUCOSE, BUN, CREATININE, CALCIUM in the last 72 hours. LFT No results for input(s): PROT, ALBUMIN, AST, ALT, ALKPHOS, BILITOT, BILIDIR, IBILI in the last 72 hours. PT/INR No results for input(s): LABPROT, INR in the last 72 hours.   Impression / Plan: This is a 39 y.o.female who presents for EGD/EUS to evaluate dilated biliary tree, abnormal LFTs, chronic abdominal pain, pancreatic cyst, rule out chronic pancreatitis.  The risks of an EUS including intestinal perforation, bleeding, infection, aspiration, and medication effects were discussed as was the possibility it may not give a definitive diagnosis if a biopsy is performed.  When a biopsy of the pancreas is done as part of the EUS, there is an additional risk of pancreatitis at the rate of about 1-2%.  It was explained that procedure related pancreatitis is typically mild, although it can be severe and even life threatening, which is why we do not perform random pancreatic biopsies and only biopsy a lesion/area we feel is concerning enough to warrant the risk.  The risks and benefits of endoscopic evaluation were discussed with the patient; these include but are not limited to the risk of perforation, infection, bleeding, missed lesions, lack of diagnosis, severe illness requiring hospitalization, as well as anesthesia and sedation related illnesses.  The patient is agreeable to proceed.    Corliss Parish, MD [facility]oscopy Office # [phone]

## 2019-09-28 NOTE — Op Note (Signed)
[facility] Patient Name: Hannah Rhodes Procedure Date : 09/28/2019 MRN: [id number] Attending MD: Justice Britain , MD Date of Birth: [birth year] CSN: [id number] Age: 39 Admit Type: Outpatient Procedure:                Upper EUS Indications:              Common bile duct dilation (acquired) seen on MRCP,                            Pancreatic cyst on MRCP, Abnormal liver function                            test, Generalized abdominal pain Providers:                Justice Britain, MD, Carlyn Reichert, RN, Theodora Blow, Technician Referring MD:             Norvel Richards, MD, Catalina Antigua, MD, NP                            Gordy Levan Medicines:                Monitored Anesthesia Care, Cipro 400 mg IV,                            Glucagon 9.76 mg IV Complications:            No immediate complications. Estimated Blood Loss:     Estimated blood loss was minimal. Procedure:                Pre-Anesthesia Assessment:                           - Prior to the procedure, a History and Physical                            was performed, and patient medications and                            allergies were reviewed. The patient's tolerance of                            previous anesthesia was also reviewed. The risks                            and benefits of the procedure and the sedation                            options and risks were discussed with the patient.                            All questions were answered, and informed consent  was obtained. Prior Anticoagulants: The patient has                            taken no previous anticoagulant or antiplatelet                            agents. ASA Grade Assessment: III - A patient with                            severe systemic disease. After reviewing the risks                            and benefits, the patient was deemed in                            satisfactory  condition to undergo the procedure.                           After obtaining informed consent, the endoscope was                            passed under direct vision. Throughout the                            procedure, the patient's blood pressure, pulse, and                            oxygen saturations were monitored continuously. The                            [id number]-H190 ([id number]) Olympus gastroscope was                            introduced through the mouth, and advanced to the                            second part of duodenum. The [id number]-[id number] ([id number])                            [place] was introduced through the                            mouth, and advanced to the second part of duodenum.                            The GF-[id number] ([id number]) Olympus Linear EUS was                            introduced through the mouth, and advanced to the                            duodenum for ultrasound examination from the  stomach and duodenum. The GF-[id number]-AL5 ([id number])                            Olympus Radial EUS scope was introduced through the                            mouth, and advanced to the duodenum for ultrasound                            examination. The upper EUS was accomplished without                            difficulty. The patient tolerated the procedure. Scope In: Scope Out: Findings:      ENDOSCOPIC FINDING: :      LA Grade B (one or more mucosal breaks greater than 5 mm, not extending       between the tops of two mucosal folds) esophagitis with no bleeding was       found in the distal esophagus.      The Z-line was regular and was found 36 cm from the incisors.      No other gross lesions were noted in the entire esophagus.      There is no endoscopic evidence of varices in the entire esophagus.      Mild portal hypertensive gastropathy was found in the gastric body.      Patchy mildly erythematous mucosa without bleeding was  found in the       gastric body and in the gastric antrum (with some nodularity noted as       well).      No other gross lesions were noted in the entire examined stomach.       Biopsies were taken with a cold forceps for histology and Helicobacter       pylori testing.      There is no endoscopic evidence of varices in the entire examined       stomach.      No gross lesions were noted in the duodenal bulb, in the first portion       of the duodenum and in the second portion of the duodenum.      Mucosal changes characterized by granularity and altered texture were       found at the major papilla. Biopsies were taken with a cold forceps for       histology to rule out adenomatous change.      ENDOSONOGRAPHIC FINDING: :      An anechoic lesion suggestive of a cyst was identified in the pancreatic       head-neck region. It communicates with the pancreatic duct. The lesion       measured 22 mm by 18 mm in maximal cross-sectional diameter. There was a       single compartment thinly septated. The outer wall of the lesion was       thin. There was no associated mass. There was no internal debris within       the fluid-filled cavity. As a result of the lesion enlarging from       previous MRICP 60-month ago, decision made to pursue attempt at FNA. In       the gastric region, the confluence was too close for safe aspiration. In  the duodenum, aspiration could be attempted more safely. Diagnostic       needle aspiration for fluid was performed. Color Doppler imaging was       utilized prior to needle puncture to confirm a lack of significant       vascular structures within the needle path. One pass was made with the       22 gauge Expect needle using a transduodenal approach. A stylet was used       initially. The amount of fluid collected was 9 mL. The fluid was clear,       yellow and slightly viscous. Sample(s) were sent for amylase       concentration, cytology and CEA and attempt  at sending Glucose.      Pancreatic parenchyma was evaluated and did not show significant       abnormalities.      There was some mild dilation in the common bile duct (6.8 mm -> 7.6 mm)       and in the common hepatic duct (6.6 mm).      There was a suggestion of a narrowing in the lower third of the main       bile duct but no associated mass/lesion. The previously noted pancreatic       cyst was in the region however, but not clear it was/is causing       compression here.      A small amount of hyperechoic material consistent with sludge was       visualized endosonographically in the gallbladder.      Localized wall thickening was visualized endosonographically at the       ampulla. This appeared to primarily be due to thickening within the       luminal interface/superficial mucosa (Layer 1) and deep mucosa (Layer       2). The thickness of the abnormal layers. The duodenal wall measured up       to 14 mm in thickness.      Endosonographic imaging in the visualized portion of the liver showed no       mass-lesion.      No malignant-appearing lymph nodes were visualized in the celiac region       (level 20), peripancreatic region and porta hepatis region.      The celiac region was visualized. Impression:               EGD Impression:                           - LA Grade B esophagitis with no bleeding distally.                           - Z-line regular, 36 cm from the incisors.                           - No varices.                           - Portal hypertensive gastropathy.                           - Erythematous mucosa in the gastric body and  antrum (some nodularity noted). Biopsied.                           - No gross lesions in the duodenal bulb, in the                            first portion of the duodenum and in the second                            portion of the duodenum.                           - Mucosal changes at the ampulla.  Biopsied to rule                            out adenoma.                           EUS Impression:                           - A cystic lesion was seen in the pancreatic                            head-neck region. Cytology results are pending.                            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 Fine needle aspiration for fluid                            performed.                           - Pancreatic parenchyma was mostly normal.                           - There was slight dilation in the common bile duct                            and in the common hepatic duct.                           - There was a suggestion of some narrowing in the                            lower third of the main bile duct (in region of the                            cystic  region but not clearly causing compression.                           - Hyperechoic material consistent with sludge was                            visualized endosonographically in the gallbladder.                           - Wall thickening was seen at the ampulla. It                            appeared to primarily be within the luminal                            interface/superficial mucosa (Layer 1) and deep                            mucosa (Layer 2). Query if this will be an                            ampullary adenoma as concerned above.                           - No malignant-appearing lymph nodes were                            visualized in the celiac region (level 20),                            peripancreatic region and porta hepatis region. Recommendation:           - The patient will be observed post-procedure,                            until all discharge criteria are met.                           - Discharge patient to home.                           - Patient has a contact  number available for                            emergencies. The signs and symptoms of potential                            delayed complications were discussed with the                            patient. Return to normal activities tomorrow.                            Written discharge instructions were provided to the  patient.                           - Low fat diet for 1 week.                           - Observe patient's clinical course.                           - Monitor for signs/symptoms of bleeding,                            perforation, infection, or pancreatitis. If issues                            please call our number to get further assistance as                            needed.                           - Await cytology results and await path results.                           - Start Omeprazole 40 mg daily to heal esophagitis                            and for likely gastritis.                           - Ciprofloxacin 500 mg twice daily for next 5-days                            to decrease risk of post-EUS infectious                            complications.                           - If cytology is consistent with IPMN then would                            normally recommend repeat imaging in 3-6 months. If                            patient has an ampullary adenoma found on biopsies,                            then consideration of ampullectomy would be had.                            However, if there is both of these occuring at same                            time, then decision tree as to whether  a Whipple                            may be something to consider should be had.                           - Recommend seeing how LFT pattern does over the                            course of the next few months as she completes her                            Hepatitis C treatment to see how much of the                            changes  were a result of this. Likely may benefit                            from consideration of repeat Liver Elastography at                            completion to see her degree of fibrosis as she                            already has what appears to be Portal gastropathy.                           - The findings and recommendations were discussed                            with the patient.                           - The findings and recommendations were discussed                            with the designated responsible adult. Procedure Code(s):        --- Professional ---                           [phone], Esophagogastroduodenoscopy, flexible,                            transoral; with transendoscopic ultrasound-guided                            intramural or transmural fine needle                            aspiration/biopsy(s), (includes endoscopic                            ultrasound examination limited to the esophagus,  stomach or duodenum, and adjacent structures) Diagnosis Code(s):        --- Professional ---                           K20.90, Esophagitis, unspecified without bleeding                           K76.6, Portal hypertension                           K31.89, Other diseases of stomach and duodenum                           I89.9, Noninfective disorder of lymphatic vessels                            and lymph nodes, unspecified                           K83.8, Other specified diseases of biliary tract                           K86.2, Cyst of pancreas                           K86.9, Disease of pancreas, unspecified                           R94.5, Abnormal results of liver function studies                           R10.84, Generalized abdominal pain                           R93.2, Abnormal findings on diagnostic imaging of                            liver and biliary tract CPT copyright 2019 [employer]. All rights  reserved. The codes documented in this report are preliminary and upon coder review may  be revised to meet current compliance requirements. Justice Britain, MD 09/28/2019 9:15:40 AM Number of Addenda: 0

## 2019-09-28 NOTE — Transfer of Care (Signed)
Immediate Anesthesia Transfer of Care Note  Patient: Hannah Rhodes  Procedure(s) Performed: ESOPHAGOGASTRODUODENOSCOPY (EGD) WITH PROPOFOL (N/A ) BIOPSY UPPER ENDOSCOPIC ULTRASOUND (EUS) LINEAR FULL UPPER ENDOSCOPIC ULTRASOUND (EUS) RADIAL FINE NEEDLE ASPIRATION (FNA) LINEAR  Patient Location: PACU  Anesthesia Type:MAC  Level of Consciousness: drowsy and patient cooperative  Airway & Oxygen Therapy: Patient Spontanous Breathing  Post-op Assessment: Report given to RN and Post -op Vital signs reviewed and stable  Post vital signs: Reviewed and stable  Last Vitals:  Vitals Value Taken Time  BP 125/89 09/28/19 0845  Temp    Pulse 81 09/28/19 0845  Resp 16 09/28/19 0845  SpO2 100 % 09/28/19 0845  Vitals shown include unvalidated device data.  Last Pain:  Vitals:   09/28/19 0641  TempSrc: Oral  PainSc: 0-No pain         Complications: No complications documented.

## 2019-09-29 LAB — SURGICAL PATHOLOGY

## 2019-09-29 LAB — CYTOLOGY - NON PAP

## 2019-09-30 ENCOUNTER — Encounter (HOSPITAL_COMMUNITY): Payer: Self-pay | Admitting: Gastroenterology

## 2019-10-12 ENCOUNTER — Encounter: Payer: Self-pay | Admitting: Gastroenterology

## 2019-11-02 ENCOUNTER — Telehealth: Payer: Self-pay | Admitting: Gastroenterology

## 2019-11-02 NOTE — Telephone Encounter (Signed)
Patient called states she received a letter in reference to results and she is wanting to go over it with a nurse

## 2019-11-02 NOTE — Telephone Encounter (Signed)
No answer and no voice mail.

## 2019-11-03 NOTE — Telephone Encounter (Signed)
Tired again to call pt on all numbers.  The number provided with the last call has no voice mail set up. Will wait for further communication from the pt

## 2019-11-03 NOTE — Telephone Encounter (Signed)
Left message on machine to call back  

## 2019-11-05 ENCOUNTER — Telehealth: Payer: Self-pay | Admitting: Gastroenterology

## 2019-11-05 ENCOUNTER — Telehealth: Payer: Self-pay

## 2019-11-05 NOTE — Telephone Encounter (Signed)
I spoke with the pt and she has been given the information. I will call her in 6 months to set up repeat MRI.

## 2019-11-05 NOTE — Telephone Encounter (Signed)
-----   Message from Lemar Lofty., MD sent at 11/05/2019  5:36 AM EDT ----- Nazly Digilio,I thought I had sent this to the patient previously.Please let her know that the laboratories were consistent with a cyst.  They were not consistent with a high risk cyst lesion but still something that will require follow-up periodically.Recommend a repeat MRI/MRCP in 6 months.The cyst may return but based on the current fluid studies it does not require surgery.Happy to talk with her in clinic sooner if she would like to follow-up.Have her follow-up for sure after the 28-month MRI/MRCP.Thanks.GM ----- Message ----- From: Loretha Stapler, RN Sent: 11/04/2019   1:40 PM EDT To: Lemar Lofty., MD  Dr Meridee Score have you seen the final studies? The pt called for any further information.

## 2019-11-05 NOTE — Telephone Encounter (Signed)
See alternate note 8/19 

## 2019-11-09 ENCOUNTER — Encounter (HOSPITAL_COMMUNITY): Payer: Self-pay

## 2019-11-09 ENCOUNTER — Other Ambulatory Visit: Payer: Self-pay

## 2019-11-09 ENCOUNTER — Emergency Department (HOSPITAL_COMMUNITY)
Admission: EM | Admit: 2019-11-09 | Discharge: 2019-11-09 | Disposition: A | Discharge status: Home / Self Care | Payer: Medicaid Other | Attending: Emergency Medicine | Admitting: Emergency Medicine

## 2019-11-09 DIAGNOSIS — K219 Gastro-esophageal reflux disease without esophagitis: Secondary | ICD-10-CM | POA: Diagnosis not present

## 2019-11-09 DIAGNOSIS — J449 Chronic obstructive pulmonary disease, unspecified: Secondary | ICD-10-CM | POA: Insufficient documentation

## 2019-11-09 DIAGNOSIS — F17210 Nicotine dependence, cigarettes, uncomplicated: Secondary | ICD-10-CM | POA: Diagnosis not present

## 2019-11-09 DIAGNOSIS — I10 Essential (primary) hypertension: Secondary | ICD-10-CM | POA: Insufficient documentation

## 2019-11-09 DIAGNOSIS — R1010 Upper abdominal pain, unspecified: Secondary | ICD-10-CM | POA: Insufficient documentation

## 2019-11-09 DIAGNOSIS — Z20822 Contact with and (suspected) exposure to covid-19: Secondary | ICD-10-CM | POA: Insufficient documentation

## 2019-11-09 DIAGNOSIS — R112 Nausea with vomiting, unspecified: Secondary | ICD-10-CM | POA: Insufficient documentation

## 2019-11-09 DIAGNOSIS — R197 Diarrhea, unspecified: Secondary | ICD-10-CM

## 2019-11-09 DIAGNOSIS — Z79899 Other long term (current) drug therapy: Secondary | ICD-10-CM | POA: Insufficient documentation

## 2019-11-09 LAB — CBC WITH DIFFERENTIAL/PLATELET
Abs Immature Granulocytes: 0.04 10*3/uL (ref 0.00–0.07)
Basophils Absolute: 0.1 10*3/uL (ref 0.0–0.1)
Basophils Relative: 1 %
Eosinophils Absolute: 0.1 10*3/uL (ref 0.0–0.5)
Eosinophils Relative: 1 %
HCT: 37.5 % (ref 36.0–46.0)
Hemoglobin: 12.7 g/dL (ref 12.0–15.0)
Immature Granulocytes: 1 %
Lymphocytes Relative: 44 %
Lymphs Abs: 3.1 10*3/uL (ref 0.7–4.0)
MCH: 31.8 pg (ref 26.0–34.0)
MCHC: 33.9 g/dL (ref 30.0–36.0)
MCV: 93.8 fL (ref 80.0–100.0)
Monocytes Absolute: 0.5 10*3/uL (ref 0.1–1.0)
Monocytes Relative: 8 %
Neutro Abs: 3.1 10*3/uL (ref 1.7–7.7)
Neutrophils Relative %: 45 %
Platelets: 243 10*3/uL (ref 150–400)
RBC: 4 MIL/uL (ref 3.87–5.11)
RDW: 13.7 % (ref 11.5–15.5)
WBC: 6.9 10*3/uL (ref 4.0–10.5)
nRBC: 0 % (ref 0.0–0.2)

## 2019-11-09 LAB — COMPREHENSIVE METABOLIC PANEL
ALT: 31 U/L (ref 0–44)
AST: 83 U/L — ABNORMAL HIGH (ref 15–41)
Albumin: 4.1 g/dL (ref 3.5–5.0)
Alkaline Phosphatase: 93 U/L (ref 38–126)
Anion gap: 13 (ref 5–15)
BUN: 8 mg/dL (ref 6–20)
CO2: 22 mmol/L (ref 22–32)
Calcium: 8.9 mg/dL (ref 8.9–10.3)
Chloride: 99 mmol/L (ref 98–111)
Creatinine, Ser: 0.62 mg/dL (ref 0.44–1.00)
GFR calc Af Amer: >60 mL/min (ref >60)
GFR calc non Af Amer: >60 mL/min (ref >60)
Glucose, Bld: 111 mg/dL — ABNORMAL HIGH (ref 70–99)
Potassium: 3.9 mmol/L (ref 3.5–5.1)
Sodium: 134 mmol/L — ABNORMAL LOW (ref 135–145)
Total Bilirubin: 2.7 mg/dL — ABNORMAL HIGH (ref 0.3–1.2)
Total Protein: 8 g/dL (ref 6.5–8.1)

## 2019-11-09 LAB — LIPASE, BLOOD: Lipase: 27 U/L (ref 11–51)

## 2019-11-09 LAB — I-STAT BETA HCG BLOOD, ED (MC, WL, AP ONLY): I-stat hCG, quantitative: <5 m[IU]/mL (ref <5)

## 2019-11-09 LAB — SARS CORONAVIRUS 2 BY RT PCR (HOSPITAL ORDER, PERFORMED IN ~~LOC~~ HOSPITAL LAB): SARS Coronavirus 2: NEGATIVE

## 2019-11-09 MED ORDER — SODIUM CHLORIDE 0.9 % IV BOLUS
1000.0000 mL | Freq: Once | INTRAVENOUS | Status: AC
  Administered 2019-11-09: 1000 mL via INTRAVENOUS

## 2019-11-09 MED ORDER — ONDANSETRON 4 MG PO TBDP
4.0000 mg | ORAL_TABLET | Freq: Three times a day (TID) | ORAL | 0 refills | Status: DC | PRN
Start: 2019-11-09 — End: 2020-01-23

## 2019-11-09 MED ORDER — ONDANSETRON HCL 4 MG/2ML IJ SOLN
4.0000 mg | Freq: Once | INTRAMUSCULAR | Status: AC
  Administered 2019-11-09: 4 mg via INTRAVENOUS
  Filled 2019-11-09: qty 2

## 2019-11-09 NOTE — ED Notes (Signed)
Water given, pt denies n/v at this time, pt ambulated to BR with a steady gait, no assistance needed

## 2019-11-09 NOTE — Discharge Instructions (Addendum)
Return to the emergency department as needed for ongoing vomiting diarrhea or worsening pain otherwise see your doctor in the outpatient setting

## 2019-11-09 NOTE — ED Notes (Signed)
Po challenge passed. 

## 2019-11-09 NOTE — ED Triage Notes (Signed)
Pt arrives from home via POV c/o 3 days of not feeling well with N/V/D and feeling cold sweats. Non-febrile during triage and Pt has not tried any OTC at home prior to arrival.

## 2019-11-09 NOTE — ED Provider Notes (Signed)
[facility] EMERGENCY DEPARTMENT Provider Note   CSN: [id number] Arrival date & time: 11/09/19  0435     History Chief Complaint  Patient presents with  . Emesis    [facility] is a 39 y.o. female.  HPI Patient presents with nausea vomiting diarrhea.  Has had for the last around 3 days.  Mild upper abdominal pain.  Has a history of chronic hepatitis and has had pancreatic cysts.  No fevers.  No known sick contacts.  Has a good appetite but has not been able to keep anything down.  States her kids are going to school today and none of them are feeling sick.  No blood in the emesis or diarrhea.  Pain is dull.    Past Medical History:  Diagnosis Date  . Alcoholism (HCC)   . Anxiety   . Chronic hepatitis C (HCC)   . COPD (chronic obstructive pulmonary disease) (HCC)    no inaler  . DDD (degenerative disc disease), lumbar   . Fatty liver   . Generalized headaches   . GERD (gastroesophageal reflux disease)    diet controlled, no med  . HPV (human papilloma virus) infection   . Hypertension   . Insomnia   . Panic attack   . Pneumonia 03/2010  . Smoker   . Tachycardia     Patient Active Problem List   Diagnosis Date Noted  . Pancreatic cyst 07/22/2019  . Chronic abdominal pain 07/22/2019  . Abnormal LFTs 07/22/2019  . Abnormal MRI of abdomen 07/22/2019  . Bile duct stricture 07/22/2019  . Common bile duct dilation 07/22/2019  . Hepatitis C, chronic (HCC) 05/06/2019  . Abnormal ultrasound of abdomen 05/06/2019  . Hepatitis C antibody positive in blood 03/10/2019  . Alcoholism (HCC) 03/10/2019  . Loss of weight 03/10/2019  . DYSPNEA 10/24/2009  . ANXIETY STATE, UNSPECIFIED 10/21/2009  . NONDEPENDENT TOBACCO USE DISORDER 10/21/2009  . INSOMNIA UNSPECIFIED 10/21/2009  . PALPITATIONS 10/21/2009    Past Surgical History:  Procedure Laterality Date  . BIOPSY  09/28/2019   Procedure: BIOPSY;  Surgeon: Meridee Score Netty Starring., MD;  Location: [facility] ENDOSCOPY;  Service:  Gastroenterology;;  . CESAREAN SECTION    . CESAREAN SECTION    . ESOPHAGOGASTRODUODENOSCOPY (EGD) WITH PROPOFOL N/A 09/28/2019   Procedure: ESOPHAGOGASTRODUODENOSCOPY (EGD) WITH PROPOFOL;  Surgeon: Meridee Score Netty Starring., MD;  Location: [facility] ENDOSCOPY;  Service: Gastroenterology;  Laterality: N/A;  . EUS  09/28/2019   Procedure: UPPER ENDOSCOPIC ULTRASOUND (EUS) LINEAR;  Surgeon: Lemar Lofty., MD;  Location: [facility] ENDOSCOPY;  Service: Gastroenterology;;  . EUS  09/28/2019   Procedure: FULL UPPER ENDOSCOPIC ULTRASOUND (EUS) RADIAL;  Surgeon: Lemar Lofty., MD;  Location: [facility] ENDOSCOPY;  Service: Gastroenterology;;  . FINE NEEDLE ASPIRATION  09/28/2019   Procedure: FINE NEEDLE ASPIRATION (FNA) LINEAR;  Surgeon: Lemar Lofty., MD;  Location: [facility] ENDOSCOPY;  Service: Gastroenterology;;  . SMALL INTESTINE SURGERY    . WISDOM TOOTH EXTRACTION       OB History    Gravida      Para      Term      Preterm      AB      Living  7     SAB      TAB      Ectopic      Multiple      Live Births              Family History  Problem Relation Age of Onset  .  Anxiety disorder Sister   . Restless legs syndrome Sister   . Heart failure Mother   . CAD Mother   . Heart attack Mother   . Colon cancer Neg Hx   . Esophageal cancer Neg Hx   . Inflammatory bowel disease Neg Hx   . Liver disease Neg Hx   . Pancreatic cancer Neg Hx   . Stomach cancer Neg Hx   . Rectal cancer Neg Hx     Social History   Tobacco Use  . Smoking status: Current Every Day Smoker    Packs/day: 0.50    Years: 24.00    Pack years: 12.00    Types: Cigarettes  . Smokeless tobacco: Never Used  . Tobacco comment: Cut back to 5 cig/day  Vaping Use  . Vaping Use: Former  Substance Use Topics  . Alcohol use: Yes    Comment: As of 08/05/19: on some weekends (typically 2-3 beers a sitting); Previously: ETOH abuse, No ETOH since 09/20/19  . Drug use: Not Currently    Types: Marijuana,  Heroin, Oxycodone, Hydrocodone, Cocaine    Comment: "only tried heroine twice" Last use end of 2020    Home Medications Prior to Admission medications   Medication Sig Start Date End Date Taking? Authorizing Provider  amLODipine (NORVASC) 10 MG tablet Take 10 mg by mouth daily.   Yes [provider]  buPROPion (WELLBUTRIN XL) 300 MG 24 hr tablet Take 300 mg by mouth daily as needed (anxiety).  05/19/19  Yes [provider]  gabapentin (NEURONTIN) 100 MG capsule Take 100 mg by mouth 3 (three) times daily as needed (pain).  06/23/19  Yes [provider]  Glecaprevir-Pibrentasvir (MAVYRET) 100-40 MG TABS Take 3 tablets by mouth daily. Take 3 tablets by mouth daily with a meal. 08/06/19  Yes Calone, Tama Headings, FNP  melatonin 5 MG TABS Take 5 mg by mouth at bedtime as needed (sleep).   Yes [provider]  nicotine (NICODERM CQ - DOSED IN MG/24 HOURS) 21 mg/24hr patch Place 21 mg onto the skin daily.   Yes [provider]  omeprazole (PRILOSEC) 40 MG capsule Take 1 capsule (40 mg total) by mouth daily. 09/28/19 09/27/20 Yes Mansouraty, Netty Starring., MD  methocarbamol (ROBAXIN) 500 MG tablet Take 1 tablet (500 mg total) by mouth every 8 (eight) hours as needed for muscle spasms. 06/02/19   Petrucelli, Samantha R, PA-C  metoprolol tartrate (LOPRESSOR) 50 MG tablet Take 1.5 tablets (75 mg total) by mouth 2 (two) times daily. Patient taking differently: Take 75 mg by mouth 2 (two) times daily.  04/09/19 09/15/19  Laqueta Linden, MD  ondansetron (ZOFRAN-ODT) 4 MG disintegrating tablet Take 1 tablet (4 mg total) by mouth every 8 (eight) hours as needed for nausea or vomiting. 11/09/19   Benjiman Core, MD    Allergies    Adhesive [tape]  Review of Systems   Review of Systems  Constitutional: Negative for appetite change and fever.  HENT: Negative for congestion.   Respiratory: Negative for shortness of breath.   Cardiovascular: Negative for chest pain.    Gastrointestinal: Positive for abdominal pain, diarrhea, nausea and vomiting.  Musculoskeletal: Negative for back pain.  Skin: Negative for rash.  Neurological: Negative for weakness.  Psychiatric/Behavioral: Negative for confusion.    Physical Exam Updated Vital Signs BP (!) 138/102 (BP Location: Left Arm)   Pulse 93   Temp 98.2 F (36.8 C) (Oral)   Resp 20   Ht 4\' 10"  (1.473 m)  Wt 41.7 kg   LMP 09/17/2019   SpO2 98%   BMI 19.23 kg/m   Physical Exam Vitals and nursing note reviewed.  HENT:     Head: Normocephalic.  Eyes:     Pupils: Pupils are equal, round, and reactive to light.  Cardiovascular:     Rate and Rhythm: Regular rhythm.  Pulmonary:     Breath sounds: Normal breath sounds.  Abdominal:     Tenderness: There is abdominal tenderness.     Comments: Upper abdominal tenderness without rebound or guarding.  Musculoskeletal:     Cervical back: Neck supple.     Right lower leg: No edema.     Left lower leg: No edema.  Skin:    General: Skin is warm.     Capillary Refill: Capillary refill takes less than 2 seconds.  Neurological:     Mental Status: She is alert and oriented to person, place, and time.     ED Results / Procedures / Treatments   Labs (all labs ordered are listed, but only abnormal results are displayed) Labs Reviewed  COMPREHENSIVE METABOLIC PANEL - Abnormal; Notable for the following components:      Result Value   Sodium 134 (*)    Glucose, Bld 111 (*)    AST 83 (*)    Total Bilirubin 2.7 (*)    All other components within normal limits  SARS CORONAVIRUS 2 BY RT PCR (HOSPITAL ORDER, PERFORMED IN [facility])  LIPASE, BLOOD  CBC WITH DIFFERENTIAL/PLATELET  I-STAT BETA HCG BLOOD, ED ([facility], [facility], [facility] ONLY)    EKG None  Radiology No results found.  Procedures Procedures (including critical care time)  Medications Ordered in ED Medications  ondansetron (ZOFRAN) injection 4 mg (4 mg Intravenous Given 11/09/19 0529)   sodium chloride 0.9 % bolus 1,000 mL (0 mLs Intravenous Stopped 11/09/19 0626)  sodium chloride 0.9 % bolus 1,000 mL (1,000 mLs Intravenous New Bag/Given 11/09/19 5427)    ED Course  I have reviewed the triage vital signs and the nursing notes.  Pertinent labs & imaging results that were available during my care of the patient were reviewed by me and considered in my medical decision making (see chart for details).    MDM Rules/Calculators/A&P                         Patient with nausea vomiting diarrhea.  Mild upper abdominal pain.  History of some liver and pancreatic issues but lab work overall reassuring.  Bilirubin is up which is new, however other LFTs are improved.  Patient had first liter bolus and states she felt dehydrated.  Second fluid bolus given and will give oral trial.  Care turned over to Dr. Hyacinth Meeker.  Final Clinical Impression(s) / ED Diagnoses Final diagnoses:  Nausea vomiting and diarrhea    Rx / DC Orders ED Discharge Orders         Ordered    ondansetron (ZOFRAN-ODT) 4 MG disintegrating tablet  Every 8 hours PRN        11/09/19 0657           Benjiman Core, MD 11/09/19 [phone]

## 2019-11-19 ENCOUNTER — Ambulatory Visit (HOSPITAL_COMMUNITY): Payer: Medicaid Other | Admitting: Clinical

## 2019-11-19 ENCOUNTER — Other Ambulatory Visit: Payer: Self-pay

## 2019-11-26 ENCOUNTER — Emergency Department (HOSPITAL_COMMUNITY)
Admission: EM | Admit: 2019-11-26 | Discharge: 2019-11-26 | Disposition: A | Discharge status: Home / Self Care | Payer: Medicaid Other | Attending: Emergency Medicine | Admitting: Emergency Medicine

## 2019-11-26 ENCOUNTER — Encounter (HOSPITAL_COMMUNITY): Payer: Self-pay

## 2019-11-26 ENCOUNTER — Emergency Department (HOSPITAL_COMMUNITY): Payer: Medicaid Other

## 2019-11-26 ENCOUNTER — Other Ambulatory Visit: Payer: Self-pay

## 2019-11-26 DIAGNOSIS — S2231XA Fracture of one rib, right side, initial encounter for closed fracture: Secondary | ICD-10-CM | POA: Insufficient documentation

## 2019-11-26 DIAGNOSIS — J449 Chronic obstructive pulmonary disease, unspecified: Secondary | ICD-10-CM | POA: Insufficient documentation

## 2019-11-26 DIAGNOSIS — Z79899 Other long term (current) drug therapy: Secondary | ICD-10-CM | POA: Diagnosis not present

## 2019-11-26 DIAGNOSIS — Y939 Activity, unspecified: Secondary | ICD-10-CM | POA: Diagnosis not present

## 2019-11-26 DIAGNOSIS — Y999 Unspecified external cause status: Secondary | ICD-10-CM | POA: Insufficient documentation

## 2019-11-26 DIAGNOSIS — F17210 Nicotine dependence, cigarettes, uncomplicated: Secondary | ICD-10-CM | POA: Diagnosis not present

## 2019-11-26 DIAGNOSIS — X58XXXA Exposure to other specified factors, initial encounter: Secondary | ICD-10-CM | POA: Diagnosis not present

## 2019-11-26 DIAGNOSIS — Y929 Unspecified place or not applicable: Secondary | ICD-10-CM | POA: Insufficient documentation

## 2019-11-26 DIAGNOSIS — S299XXA Unspecified injury of thorax, initial encounter: Secondary | ICD-10-CM | POA: Diagnosis present

## 2019-11-26 DIAGNOSIS — I10 Essential (primary) hypertension: Secondary | ICD-10-CM | POA: Insufficient documentation

## 2019-11-26 MED ORDER — OXYCODONE-ACETAMINOPHEN 5-325 MG PO TABS
1.0000 | ORAL_TABLET | Freq: Once | ORAL | Status: AC
  Administered 2019-11-26: 1 via ORAL
  Filled 2019-11-26: qty 1

## 2019-11-26 MED ORDER — METHOCARBAMOL 500 MG PO TABS
500.0000 mg | ORAL_TABLET | Freq: Two times a day (BID) | ORAL | 0 refills | Status: DC
Start: 2019-11-26 — End: 2020-03-24

## 2019-11-26 NOTE — ED Notes (Signed)
Pt given ginger ale & crackers

## 2019-11-26 NOTE — Discharge Instructions (Signed)
You have a single rib fracture.  This is common with CPR.  Please take Tylenol and ibuprofen as discussed below.  I have also provided with muscle relaxer which you may use.  Patient plenty of water use the incentive spirometer at least 3 times daily to take deep breaths.  This will prevent you from getting a pneumonia as a result of taking shallow breaths from the pain that typically happens with rib fractures.

## 2019-11-26 NOTE — ED Provider Notes (Signed)
[facility]ERG[facility] Provider Note   CSN: [id number] Arrival date & time: 11/26/19  1931     History Chief Complaint  Patient presents with  . Pain    [facility] is a 39 y.o. female.  HPI  Patient is 39 year old female with past medical history of alcoholism, anxiety, chronic hepatitis C, COPD, fatty liver disease, hypertension, anxiety, tachycardia, polysubstance abuse  Patient is presented today with chest pain that she states is achy constant worse with deep breaths coughing and touch.  She states that she was seen at [facility] 9/6 after she had a heroin overdose she states that she this was a one-time event and that she does not frequently use.  She states that prior to being taken to the hospital by EMS she had a bystander performed CPR on her for uncertain amount of time.  She states that when she came around to the hospital she had a bruise on her chest and pain.  She states that she has no other complaints today.  She denies any shortness of breath, lightheadedness, dizziness, denies any abdominal pain nausea or vomiting.  Denies any fevers or chills.  No congestion.  She states that her cough is dry.  She denies any aggravating or mitigating factors.  She is taken nothing for pain today.  She denies any drug use today.    Past Medical History:  Diagnosis Date  . Alcoholism (HCC)   . Anxiety   . Chronic hepatitis C (HCC)   . COPD (chronic obstructive pulmonary disease) (HCC)    no inaler  . DDD (degenerative disc disease), lumbar   . Fatty liver   . Generalized headaches   . GERD (gastroesophageal reflux disease)    diet controlled, no med  . HPV (human papilloma virus) infection   . Hypertension   . Insomnia   . Panic attack   . Pneumonia 03/2010  . Smoker   . Tachycardia     Patient Active Problem List   Diagnosis Date Noted  . Pancreatic cyst 07/22/2019  . Chronic abdominal pain 07/22/2019  . Abnormal LFTs 07/22/2019  . Abnormal MRI of  abdomen 07/22/2019  . Bile duct stricture 07/22/2019  . Common bile duct dilation 07/22/2019  . Hepatitis C, chronic (HCC) 05/06/2019  . Abnormal ultrasound of abdomen 05/06/2019  . Hepatitis C antibody positive in blood 03/10/2019  . Alcoholism (HCC) 03/10/2019  . Loss of weight 03/10/2019  . DYSPNEA 10/24/2009  . ANXIETY STATE, UNSPECIFIED 10/21/2009  . NONDEPENDENT TOBACCO USE DISORDER 10/21/2009  . INSOMNIA UNSPECIFIED 10/21/2009  . PALPITATIONS 10/21/2009    Past Surgical History:  Procedure Laterality Date  . BIOPSY  09/28/2019   Procedure: BIOPSY;  Surgeon: Meridee Score Netty Starring., MD;  Location: [facility] ENDOSCOPY;  Service: Gastroenterology;;  . CESAREAN SECTION    . CESAREAN SECTION    . ESOPHAGOGASTRODUODENOSCOPY (EGD) WITH PROPOFOL N/A 09/28/2019   Procedure: ESOPHAGOGASTRODUODENOSCOPY (EGD) WITH PROPOFOL;  Surgeon: Meridee Score Netty Starring., MD;  Location: [facility] ENDOSCOPY;  Service: Gastroenterology;  Laterality: N/A;  . EUS  09/28/2019   Procedure: UPPER ENDOSCOPIC ULTRASOUND (EUS) LINEAR;  Surgeon: Lemar Lofty., MD;  Location: [facility] ENDOSCOPY;  Service: Gastroenterology;;  . EUS  09/28/2019   Procedure: FULL UPPER ENDOSCOPIC ULTRASOUND (EUS) RADIAL;  Surgeon: Lemar Lofty., MD;  Location: [facility] ENDOSCOPY;  Service: Gastroenterology;;  . FINE NEEDLE ASPIRATION  09/28/2019   Procedure: FINE NEEDLE ASPIRATION (FNA) LINEAR;  Surgeon: Lemar Lofty., MD;  Location: [facility] ENDOSCOPY;  Service: Gastroenterology;;  .  SMALL INTESTINE SURGERY    . WISDOM TOOTH EXTRACTION       OB History    Gravida      Para      Term      Preterm      AB      Living  7     SAB      TAB      Ectopic      Multiple      Live Births              Family History  Problem Relation Age of Onset  . Anxiety disorder Sister   . Restless legs syndrome Sister   . Heart failure Mother   . CAD Mother   . Heart attack Mother   . Colon cancer Neg Hx   . Esophageal cancer  Neg Hx   . Inflammatory bowel disease Neg Hx   . Liver disease Neg Hx   . Pancreatic cancer Neg Hx   . Stomach cancer Neg Hx   . Rectal cancer Neg Hx     Social History   Tobacco Use  . Smoking status: Current Every Day Smoker    Packs/day: 0.50    Years: 24.00    Pack years: 12.00    Types: Cigarettes  . Smokeless tobacco: Never Used  . Tobacco comment: Cut back to 5 cig/day  Vaping Use  . Vaping Use: Former  Substance Use Topics  . Alcohol use: Yes    Comment: As of 08/05/19: on some weekends (typically 2-3 beers a sitting); Previously: ETOH abuse, No ETOH since 09/20/19  . Drug use: Not Currently    Types: Marijuana, Heroin, Oxycodone, Hydrocodone, Cocaine    Comment: "only tried heroine twice" Last use end of 2020    Home Medications Prior to Admission medications   Medication Sig Start Date End Date Taking? Authorizing Provider  gabapentin (NEURONTIN) 100 MG capsule Take 1 capsule by mouth 2 (two) times daily. 10/09/19  Yes [provider]  amLODipine (NORVASC) 10 MG tablet Take 10 mg by mouth daily.    [provider]  buPROPion (WELLBUTRIN XL) 300 MG 24 hr tablet Take 300 mg by mouth daily as needed (anxiety).  05/19/19   [provider]  chlordiazePOXIDE (LIBRIUM) 5 MG capsule Take 5 mg by mouth 3 (three) times daily as needed. 08/28/19   [provider]  gabapentin (NEURONTIN) 100 MG capsule Take 100 mg by mouth 3 (three) times daily as needed (pain).  06/23/19   [provider]  Glecaprevir-Pibrentasvir (MAVYRET) 100-40 MG TABS Take 3 tablets by mouth daily. Take 3 tablets by mouth daily with a meal. 08/06/19   Calone, Tama Headings, FNP  melatonin 5 MG TABS Take 5 mg by mouth at bedtime as needed (sleep).    [provider]  methocarbamol (ROBAXIN) 500 MG tablet Take 1 tablet (500 mg total) by mouth 2 (two) times daily. 11/26/19   Gailen Shelter, PA  metoprolol tartrate (LOPRESSOR) 50 MG tablet Take 1.5 tablets (75 mg total) by  mouth 2 (two) times daily. Patient taking differently: Take 75 mg by mouth 2 (two) times daily.  04/09/19 09/15/19  Laqueta Linden, MD  nicotine (NICODERM CQ - DOSED IN MG/24 HOURS) 21 mg/24hr patch Place 21 mg onto the skin daily.    [provider]  omeprazole (PRILOSEC) 40 MG capsule Take 1 capsule (40 mg total) by mouth daily. 09/28/19 09/27/20  Mansouraty, Netty Starring., MD  ondansetron (ZOFRAN-ODT) 4 MG disintegrating tablet Take 1 tablet (4 mg total) by mouth every 8 (eight) hours as needed for nausea or vomiting. 11/09/19   Benjiman Core, MD    Allergies    Adhesive [tape]  Review of Systems   Review of Systems  Constitutional: Negative for fever.  HENT: Negative for congestion.   Respiratory: Negative for shortness of breath.   Cardiovascular: Positive for chest pain.  Gastrointestinal: Negative for abdominal distention.  Skin:       Bruise on chest  Neurological: Negative for dizziness and headaches.    Physical Exam Updated Vital Signs BP 138/90 (BP Location: Left Arm)   Pulse 99   Temp 98.1 F (36.7 C) (Oral)   Resp 17   Ht 4\' 10"  (1.473 m)   Wt 42.6 kg   SpO2 96%   BMI 19.65 kg/m   Physical Exam Vitals and nursing note reviewed.  Constitutional:      General: She is not in acute distress.    Appearance: Normal appearance. She is not ill-appearing.  HENT:     Head: Normocephalic and atraumatic.     Nose: Nose normal.  Eyes:     General: No scleral icterus.       Right eye: No discharge.        Left eye: No discharge.     Conjunctiva/sclera: Conjunctivae normal.  Cardiovascular:     Rate and Rhythm: Normal rate and regular rhythm.     Pulses: Normal pulses.     Heart sounds: Normal heart sounds.     Comments: Heart rate 96 on my exam.  No murmurs rubs or gallops.  Bilateral radial pulses are 3+ and symmetric. Pulmonary:     Effort: Pulmonary effort is normal. No respiratory distress.     Breath sounds: Normal breath sounds. No stridor.  No wheezing.     Comments: Lungs are clear to auscultation all fields.  Reproducible anterior chest wall tenderness to palpation over the area of bruising that is sternal.  There is also right-sided tenderness to palpation over the right rib cage. Chest:     Chest wall: Tenderness present.  Abdominal:     Palpations: Abdomen is soft.     Tenderness: There is no abdominal tenderness. There is no guarding or rebound.  Musculoskeletal:     Cervical back: Normal range of motion.     Right lower leg: No edema.     Left lower leg: No edema.  Skin:    General: Skin is warm and dry.     Capillary Refill: Capillary refill takes less than 2 seconds.  Neurological:     Mental Status: She is alert and oriented to person, place, and time. Mental status is at baseline.  Psychiatric:        Mood and Affect: Mood normal.        Behavior: Behavior normal.     ED Results / Procedures / Treatments   Labs (all labs ordered are listed, but only abnormal results are displayed) Labs Reviewed - No data to display  EKG None  Radiology DG Ribs Unilateral W/Chest Right  Result Date: 11/26/2019 CLINICAL DATA:  Chest pain and bruising. Concern for rib fracture. Receive CPR 3 days ago. EXAM: RIGHT RIBS AND CHEST - 3+ VIEW COMPARISON:  Chest radiograph 11/23/2019 FINDINGS: Suspected nondisplaced fracture involving right anterior tenth rib. No additional fractures are visualized, however anterior rib fractures typically seen with CPR can be difficult to assess by radiograph. There is  no evidence of pneumothorax or pleural effusion. Minor atelectasis at the right lung base. Heart size and mediastinal contours are within normal limits. IMPRESSION: Suspected nondisplaced right anterior tenth rib fracture. No pulmonary complication such as pneumothorax. Electronically Signed   By: Narda RutherfordMelanie  Sanford M.D.   On: 11/26/2019 22:12    Procedures Procedures (including critical care time)  Medications Ordered in  ED Medications  oxyCODONE-acetaminophen (PERCOCET/ROXICET) 5-325 MG per tablet 1 tablet (1 tablet Oral Given 11/26/19 2152)    ED Course  I have reviewed the triage vital signs and the nursing notes.  Pertinent labs & imaging results that were available during my care of the patient were reviewed by me and considered in my medical decision making (see chart for details).  Patient past medical history detailed above presented today for right-sided rib pain.  Suspect rib fracture she does have some bruising physical exam is unremarkable apart from reproducible anterior chest wall tenderness palpation.  EKG is unremarkable no evidence of acute ischemia.  It is very atypical chest pain for ACS suspect that this is secondary to the CPR that she received from a opioid overdose as detailed in HPI.  Clinical Course as of Nov 25 2229  Thu Nov 26, 2019  2226 IMPRESSION: Suspected nondisplaced right anterior tenth rib fracture. No pulmonary complication such as pneumothorax.     [WF]  2226 I personally reviewed the x-ray imaging of patient's chest and ribs.  No pneumothorax agree.  Agree.  Questionable right anterior 10th rib fracture.   [WF]    Clinical Course User Index [WF] Gailen ShelterFondaw, Dresden Lozito S, [place]   MDM Rules/Calculators/A&P                          Patient informed of rib fracture.  Given incentive spirometer and Robaxin for home.  She feels somewhat better after Percocet. Patient given return precautions she has no evidence of pneumothorax but was given education on symptoms of this.  She will follow up with her PCP.  Final Clinical Impression(s) / ED Diagnoses Final diagnoses:  Closed fracture of one rib of right side, initial encounter    Rx / DC Orders ED Discharge Orders         Ordered    methocarbamol (ROBAXIN) 500 MG tablet  2 times daily        11/26/19 2230           Solon AugustaFondaw, Akhil Piscopo LucamaS, [place] 11/26/19 2231    Benjiman CorePickering, Nathan, MD 11/26/19 [phone]

## 2019-11-26 NOTE — ED Triage Notes (Addendum)
Pt comes in for chronic pain and chest pain. Pt was at [facility] 9/6 for over dose. Per records she was given narcan twice and CPR was originally performed from the parking lot with no definite loss of pulse. Pt ended up leaving AMA from [facility]   States her chest hurts when she breathes, coughs, or moves. informed that this is normal for someone who has received CPR.  Her chronic pain is on her right side "boob, chest and right side all the way down."  No alcohol today. Last Heroin use was 9/6, denies any other drug use.

## 2019-11-29 ENCOUNTER — Other Ambulatory Visit: Payer: Self-pay

## 2019-11-29 ENCOUNTER — Encounter (HOSPITAL_COMMUNITY): Payer: Self-pay | Admitting: Emergency Medicine

## 2019-11-29 ENCOUNTER — Emergency Department (HOSPITAL_COMMUNITY)
Admission: EM | Admit: 2019-11-29 | Discharge: 2019-11-29 | Disposition: A | Discharge status: Home / Self Care | Payer: Medicaid Other | Attending: Emergency Medicine | Admitting: Emergency Medicine

## 2019-11-29 DIAGNOSIS — S2231XD Fracture of one rib, right side, subsequent encounter for fracture with routine healing: Secondary | ICD-10-CM | POA: Diagnosis not present

## 2019-11-29 DIAGNOSIS — F17210 Nicotine dependence, cigarettes, uncomplicated: Secondary | ICD-10-CM | POA: Insufficient documentation

## 2019-11-29 DIAGNOSIS — I10 Essential (primary) hypertension: Secondary | ICD-10-CM | POA: Insufficient documentation

## 2019-11-29 DIAGNOSIS — S2231XA Fracture of one rib, right side, initial encounter for closed fracture: Secondary | ICD-10-CM

## 2019-11-29 DIAGNOSIS — J449 Chronic obstructive pulmonary disease, unspecified: Secondary | ICD-10-CM | POA: Insufficient documentation

## 2019-11-29 DIAGNOSIS — Z79899 Other long term (current) drug therapy: Secondary | ICD-10-CM | POA: Diagnosis not present

## 2019-11-29 DIAGNOSIS — R0789 Other chest pain: Secondary | ICD-10-CM | POA: Diagnosis present

## 2019-11-29 DIAGNOSIS — Y33XXXD Other specified events, undetermined intent, subsequent encounter: Secondary | ICD-10-CM | POA: Diagnosis not present

## 2019-11-29 HISTORY — DX: Opioid use, unspecified, uncomplicated: F11.90

## 2019-11-29 MED ORDER — KETOROLAC TROMETHAMINE 60 MG/2ML IM SOLN
60.0000 mg | Freq: Once | INTRAMUSCULAR | Status: AC
  Administered 2019-11-29: 60 mg via INTRAMUSCULAR
  Filled 2019-11-29: qty 2

## 2019-11-29 MED ORDER — IBUPROFEN 800 MG PO TABS
800.0000 mg | ORAL_TABLET | Freq: Three times a day (TID) | ORAL | 0 refills | Status: DC | PRN
Start: 2019-11-29 — End: 2020-09-09

## 2019-11-29 NOTE — ED Notes (Signed)
Hx of recent heroin overdose   Three days later, rib fx   Here for pain

## 2019-11-29 NOTE — ED Triage Notes (Signed)
Pt with known RT rib fracture c/o worsening pain. States she was told to return to ED if pain worsened. Pt states she is taking muscle relaxers without relief.

## 2019-11-29 NOTE — ED Provider Notes (Signed)
[facility] EMERGENCY DEPARTMENT Provider Note   CSN: [id number] Arrival date & time: 11/29/19  1654     History Chief Complaint  Patient presents with  . Rib Fracture    Hannah Rhodes is a 39 y.o. female.  Pt complains of pain in her right chest.  Pt had chest compression and cpr on 9/6 after a drug overdose.  Pt diagnosed with a rib fx on 9/9. Pt reports increased pain.  No fever, chills or cough. Pt reports no relief with muscle relaxers.   The history is provided by the patient. No language interpreter was used.  Chest Pain      Past Medical History:  Diagnosis Date  . Alcoholism (HCC)   . Anxiety   . Chronic hepatitis C (HCC)   . COPD (chronic obstructive pulmonary disease) (HCC)    no inaler  . DDD (degenerative disc disease), lumbar   . Fatty liver   . Generalized headaches   . GERD (gastroesophageal reflux disease)    diet controlled, no med  . Heroin use   . HPV (human papilloma virus) infection   . Hypertension   . Insomnia   . Panic attack   . Pneumonia 03/2010  . Smoker   . Tachycardia     Patient Active Problem List   Diagnosis Date Noted  . Pancreatic cyst 07/22/2019  . Chronic abdominal pain 07/22/2019  . Abnormal LFTs 07/22/2019  . Abnormal MRI of abdomen 07/22/2019  . Bile duct stricture 07/22/2019  . Common bile duct dilation 07/22/2019  . Hepatitis C, chronic (HCC) 05/06/2019  . Abnormal ultrasound of abdomen 05/06/2019  . Hepatitis C antibody positive in blood 03/10/2019  . Alcoholism (HCC) 03/10/2019  . Loss of weight 03/10/2019  . DYSPNEA 10/24/2009  . ANXIETY STATE, UNSPECIFIED 10/21/2009  . NONDEPENDENT TOBACCO USE DISORDER 10/21/2009  . INSOMNIA UNSPECIFIED 10/21/2009  . PALPITATIONS 10/21/2009    Past Surgical History:  Procedure Laterality Date  . BIOPSY  09/28/2019   Procedure: BIOPSY;  Surgeon: Meridee Score Netty Starring., MD;  Location: [facility] ENDOSCOPY;  Service: Gastroenterology;;  . CESAREAN SECTION    . CESAREAN SECTION      . ESOPHAGOGASTRODUODENOSCOPY (EGD) WITH PROPOFOL N/A 09/28/2019   Procedure: ESOPHAGOGASTRODUODENOSCOPY (EGD) WITH PROPOFOL;  Surgeon: Meridee Score Netty Starring., MD;  Location: [facility] ENDOSCOPY;  Service: Gastroenterology;  Laterality: N/A;  . EUS  09/28/2019   Procedure: UPPER ENDOSCOPIC ULTRASOUND (EUS) LINEAR;  Surgeon: Lemar Lofty., MD;  Location: [facility] ENDOSCOPY;  Service: Gastroenterology;;  . EUS  09/28/2019   Procedure: FULL UPPER ENDOSCOPIC ULTRASOUND (EUS) RADIAL;  Surgeon: Lemar Lofty., MD;  Location: [facility] ENDOSCOPY;  Service: Gastroenterology;;  . FINE NEEDLE ASPIRATION  09/28/2019   Procedure: FINE NEEDLE ASPIRATION (FNA) LINEAR;  Surgeon: Lemar Lofty., MD;  Location: [facility] ENDOSCOPY;  Service: Gastroenterology;;  . SMALL INTESTINE SURGERY    . WISDOM TOOTH EXTRACTION       OB History    Gravida      Para      Term      Preterm      AB      Living  7     SAB      TAB      Ectopic      Multiple      Live Births              Family History  Problem Relation Age of Onset  . Anxiety disorder Sister   . Restless legs syndrome  Sister   . Heart failure Mother   . CAD Mother   . Heart attack Mother   . Colon cancer Neg Hx   . Esophageal cancer Neg Hx   . Inflammatory bowel disease Neg Hx   . Liver disease Neg Hx   . Pancreatic cancer Neg Hx   . Stomach cancer Neg Hx   . Rectal cancer Neg Hx     Social History   Tobacco Use  . Smoking status: Current Every Day Smoker    Packs/day: 0.50    Years: 24.00    Pack years: 12.00    Types: Cigarettes  . Smokeless tobacco: Never Used  . Tobacco comment: Cut back to 5 cig/day  Vaping Use  . Vaping Use: Former  Substance Use Topics  . Alcohol use: Yes    Comment: As of 08/05/19: on some weekends (typically 2-3 beers a sitting); Previously: ETOH abuse, No ETOH since 09/20/19  . Drug use: Yes    Types: Marijuana, Heroin, Oxycodone, Hydrocodone, Cocaine    Comment: accidental OD recently-  heroin    Home Medications Prior to Admission medications   Medication Sig Start Date End Date Taking? Authorizing Provider  amLODipine (NORVASC) 10 MG tablet Take 10 mg by mouth daily.    [provider]  buPROPion (WELLBUTRIN XL) 300 MG 24 hr tablet Take 300 mg by mouth daily as needed (anxiety).  05/19/19   [provider]  chlordiazePOXIDE (LIBRIUM) 5 MG capsule Take 5 mg by mouth 3 (three) times daily as needed. 08/28/19   [provider]  gabapentin (NEURONTIN) 100 MG capsule Take 100 mg by mouth 3 (three) times daily as needed (pain).  06/23/19   [provider]  gabapentin (NEURONTIN) 100 MG capsule Take 1 capsule by mouth 2 (two) times daily. 10/09/19   [provider]  Glecaprevir-Pibrentasvir (MAVYRET) 100-40 MG TABS Take 3 tablets by mouth daily. Take 3 tablets by mouth daily with a meal. 08/06/19   Veryl Speak, FNP  ibuprofen (ADVIL) 800 MG tablet Take 1 tablet (800 mg total) by mouth every 8 (eight) hours as needed. 11/29/19   Elson Areas, PA-C  melatonin 5 MG TABS Take 5 mg by mouth at bedtime as needed (sleep).    [provider]  methocarbamol (ROBAXIN) 500 MG tablet Take 1 tablet (500 mg total) by mouth 2 (two) times daily. 11/26/19   Gailen Shelter, PA  metoprolol tartrate (LOPRESSOR) 50 MG tablet Take 1.5 tablets (75 mg total) by mouth 2 (two) times daily. Patient taking differently: Take 75 mg by mouth 2 (two) times daily.  04/09/19 09/15/19  Laqueta Linden, MD  nicotine (NICODERM CQ - DOSED IN MG/24 HOURS) 21 mg/24hr patch Place 21 mg onto the skin daily.    [provider]  omeprazole (PRILOSEC) 40 MG capsule Take 1 capsule (40 mg total) by mouth daily. 09/28/19 09/27/20  Mansouraty, Netty Starring., MD  ondansetron (ZOFRAN-ODT) 4 MG disintegrating tablet Take 1 tablet (4 mg total) by mouth every 8 (eight) hours as needed for nausea or vomiting. 11/09/19   Benjiman Core, MD    Allergies    Adhesive  [tape]  Review of Systems   Review of Systems  Cardiovascular: Positive for chest pain.  All other systems reviewed and are negative.   Physical Exam Updated Vital Signs BP (S) (!) 148/106 (BP Location: Left Arm) Comment: repeated x 2  Pulse 79   Temp 98.8 F (37.1 C) (Oral)   Resp  16   Ht 4\' 10"  (1.473 m)   Wt 42.2 kg   SpO2 98%   BMI 19.44 kg/m   Physical Exam Vitals and nursing note reviewed.  Constitutional:      Appearance: She is well-developed.  HENT:     Head: Normocephalic.  Cardiovascular:     Rate and Rhythm: Normal rate and regular rhythm.     Heart sounds: Normal heart sounds.  Pulmonary:     Effort: Pulmonary effort is normal.  Abdominal:     General: Abdomen is flat. There is no distension.     Tenderness: There is no abdominal tenderness. There is no guarding.  Musculoskeletal:        General: Normal range of motion.     Cervical back: Normal range of motion.  Neurological:     Mental Status: She is alert and oriented to person, place, and time.     ED Results / Procedures / Treatments   Labs (all labs ordered are listed, but only abnormal results are displayed) Labs Reviewed - No data to display  EKG None  Radiology No results found.  Procedures Procedures (including critical care time)  Medications Ordered in ED Medications  ketorolac (TORADOL) injection 60 mg (has no administration in time range)    ED Course  I have reviewed the triage vital signs and the nursing notes.  Pertinent labs & imaging results that were available during my care of the patient were reviewed by me and considered in my medical decision making (see chart for details).    MDM Rules/Calculators/A&P                          MDM:  02 sat 98%. Lungs sounds normal.  Pt given toradol here.  Rx for ibuprofen  Final Clinical Impression(s) / ED Diagnoses Final diagnoses:  Closed fracture of one rib of right side, initial encounter    Rx / DC Orders ED  Discharge Orders         Ordered    ibuprofen (ADVIL) 800 MG tablet  Every 8 hours PRN        11/29/19 1753        An After Visit Summary was printed and given to the patient.   01/29/20, Elson Areas 11/29/19 1757    01/29/20, MD 12/01/19 [id number]

## 2019-11-30 ENCOUNTER — Telehealth (HOSPITAL_COMMUNITY): Payer: Self-pay | Admitting: Clinical

## 2019-11-30 ENCOUNTER — Ambulatory Visit (HOSPITAL_COMMUNITY): Payer: Medicaid Other | Admitting: Clinical

## 2019-11-30 NOTE — Telephone Encounter (Signed)
OPT therapist attempted text to session. The patient did not respond. The OPT therapist followed up with a phone call and the patient does not have a set up VM box

## 2020-01-19 ENCOUNTER — Emergency Department (HOSPITAL_COMMUNITY)
Admission: EM | Admit: 2020-01-19 | Discharge: 2020-01-19 | Disposition: A | Discharge status: Home / Self Care | Payer: Medicaid Other | Attending: Emergency Medicine | Admitting: Emergency Medicine

## 2020-01-19 ENCOUNTER — Other Ambulatory Visit: Payer: Self-pay

## 2020-01-19 ENCOUNTER — Encounter (HOSPITAL_COMMUNITY): Payer: Self-pay

## 2020-01-19 DIAGNOSIS — Z79899 Other long term (current) drug therapy: Secondary | ICD-10-CM | POA: Insufficient documentation

## 2020-01-19 DIAGNOSIS — J069 Acute upper respiratory infection, unspecified: Secondary | ICD-10-CM | POA: Insufficient documentation

## 2020-01-19 DIAGNOSIS — J449 Chronic obstructive pulmonary disease, unspecified: Secondary | ICD-10-CM | POA: Insufficient documentation

## 2020-01-19 DIAGNOSIS — F17210 Nicotine dependence, cigarettes, uncomplicated: Secondary | ICD-10-CM | POA: Diagnosis not present

## 2020-01-19 DIAGNOSIS — J988 Other specified respiratory disorders: Secondary | ICD-10-CM

## 2020-01-19 DIAGNOSIS — I10 Essential (primary) hypertension: Secondary | ICD-10-CM | POA: Diagnosis not present

## 2020-01-19 DIAGNOSIS — W57XXXA Bitten or stung by nonvenomous insect and other nonvenomous arthropods, initial encounter: Secondary | ICD-10-CM | POA: Insufficient documentation

## 2020-01-19 DIAGNOSIS — R509 Fever, unspecified: Secondary | ICD-10-CM | POA: Diagnosis present

## 2020-01-19 DIAGNOSIS — S0081XA Abrasion of other part of head, initial encounter: Secondary | ICD-10-CM | POA: Diagnosis not present

## 2020-01-19 DIAGNOSIS — U071 COVID-19: Secondary | ICD-10-CM | POA: Insufficient documentation

## 2020-01-19 DIAGNOSIS — B9789 Other viral agents as the cause of diseases classified elsewhere: Secondary | ICD-10-CM

## 2020-01-19 LAB — RESP PANEL BY RT PCR (RSV, FLU A&B, COVID)
Influenza A by PCR: NEGATIVE
Influenza B by PCR: NEGATIVE
Respiratory Syncytial Virus by PCR: NEGATIVE
SARS Coronavirus 2 by RT PCR: POSITIVE — AB

## 2020-01-19 NOTE — Discharge Instructions (Signed)
You have been seen here for URI like symptoms.  I recommend taking Tylenol for fever control and ibuprofen for pain control please follow dosing on the back of bottle.  I recommend staying hydrated and if you do not an appetite, I recommend soups as this will provide you with fluids and calories.  Your Covid test is pending I recommend self quarantine until you get your results back on MyChart.  If you are Covid positive you must self quarantine for 10 days starting on symptom onset.  I would like you to contact "post Covid care" as they will provide you with information how to manage your Covid symptoms.  To treat your bug bite, I recommend applying warm compresses to the area, rinsing off the area with cool clean water, applying antibiotic cream and changing dressings twice a day for the next 7 days.  If you are Covid negative and continue with your symptoms for greater than 7 days please follow with your PCP for further evaluation.  If your bug bites increase in redness, pain, you develop fevers or chills,chest pain, shortness of breath, severe abdominal pain, uncontrolled nausea, vomiting, diarrhea Come back to the emergency department

## 2020-01-19 NOTE — ED Triage Notes (Signed)
Pt states she has a fever of 102, has 2 spider bites and has been exposed to COVID.  Pt alert and oriented, Spider bites bit to left cheek and left side of lower back

## 2020-01-19 NOTE — ED Provider Notes (Signed)
[facility] EMERGENCY DEPARTMENT Provider Note   CSN: [id number] Arrival date & time: 01/19/20  1111     History Chief Complaint  Patient presents with  . Fever    Hannah Rhodes is a 39 y.o. female.  HPI   Patient with significant medical history of COPD, heroin use, hypertension, current smoker presents to the emergency department with chief complaint of URI-like symptoms and two bug bites.  Patient states she developed URI-like symptoms starting on Saturday, she endorses headaches, fevers, chills, general body aches, and a dry cough.  She denies nasal congestion, sore throat, chest pain, shortness of breath, abdominal pain, nausea, vomiting, diarrhea.  She states she came to contact with someone who is Covid positive  on Saturday and immediately developed the symptoms, she is not currently Covid vaccinated and is not immunocompromise.  She also mentions that she was bit by 2 spiders in different locations.  She states this happened about 1 week ago and the  first 1 was on her left cheek  which she physically saw a bite her and the other wound was on her right side of her lower back. she states her mother's thought it was a spider.  She endorses some discharge and some slight pain but denies worsening redness around the area, or worsening pain.  Patient denies ear pain, sore throat, chest pain, shortness of breath, abdominal pain, nausea, vomiting, diarrhea, pedal edema.  Past Medical History:  Diagnosis Date  . Alcoholism (HCC)   . Anxiety   . Chronic hepatitis C (HCC)   . COPD (chronic obstructive pulmonary disease) (HCC)    no inaler  . DDD (degenerative disc disease), lumbar   . Fatty liver   . Generalized headaches   . GERD (gastroesophageal reflux disease)    diet controlled, no med  . Heroin use   . HPV (human papilloma virus) infection   . Hypertension   . Insomnia   . Panic attack   . Pneumonia 03/2010  . Smoker   . Tachycardia     Patient Active Problem List    Diagnosis Date Noted  . Pancreatic cyst 07/22/2019  . Chronic abdominal pain 07/22/2019  . Abnormal LFTs 07/22/2019  . Abnormal MRI of abdomen 07/22/2019  . Bile duct stricture 07/22/2019  . Common bile duct dilation 07/22/2019  . Hepatitis C, chronic (HCC) 05/06/2019  . Abnormal ultrasound of abdomen 05/06/2019  . Hepatitis C antibody positive in blood 03/10/2019  . Alcoholism (HCC) 03/10/2019  . Loss of weight 03/10/2019  . DYSPNEA 10/24/2009  . ANXIETY STATE, UNSPECIFIED 10/21/2009  . NONDEPENDENT TOBACCO USE DISORDER 10/21/2009  . INSOMNIA UNSPECIFIED 10/21/2009  . PALPITATIONS 10/21/2009    Past Surgical History:  Procedure Laterality Date  . BIOPSY  09/28/2019   Procedure: BIOPSY;  Surgeon: Meridee Score Netty Starring., MD;  Location: [facility] ENDOSCOPY;  Service: Gastroenterology;;  . CESAREAN SECTION    . CESAREAN SECTION    . ESOPHAGOGASTRODUODENOSCOPY (EGD) WITH PROPOFOL N/A 09/28/2019   Procedure: ESOPHAGOGASTRODUODENOSCOPY (EGD) WITH PROPOFOL;  Surgeon: Meridee Score Netty Starring., MD;  Location: [facility] ENDOSCOPY;  Service: Gastroenterology;  Laterality: N/A;  . EUS  09/28/2019   Procedure: UPPER ENDOSCOPIC ULTRASOUND (EUS) LINEAR;  Surgeon: Lemar Lofty., MD;  Location: [facility] ENDOSCOPY;  Service: Gastroenterology;;  . EUS  09/28/2019   Procedure: FULL UPPER ENDOSCOPIC ULTRASOUND (EUS) RADIAL;  Surgeon: Lemar Lofty., MD;  Location: [facility] ENDOSCOPY;  Service: Gastroenterology;;  . FINE NEEDLE ASPIRATION  09/28/2019   Procedure: FINE NEEDLE ASPIRATION (FNA) LINEAR;  Surgeon: Lemar Lofty., MD;  Location: [facility] ENDOSCOPY;  Service: Gastroenterology;;  . SMALL INTESTINE SURGERY    . WISDOM TOOTH EXTRACTION       OB History    Gravida      Para      Term      Preterm      AB      Living  7     SAB      TAB      Ectopic      Multiple      Live Births              Family History  Problem Relation Age of Onset  . Anxiety disorder Sister   .  Restless legs syndrome Sister   . Heart failure Mother   . CAD Mother   . Heart attack Mother   . Colon cancer Neg Hx   . Esophageal cancer Neg Hx   . Inflammatory bowel disease Neg Hx   . Liver disease Neg Hx   . Pancreatic cancer Neg Hx   . Stomach cancer Neg Hx   . Rectal cancer Neg Hx     Social History   Tobacco Use  . Smoking status: Current Every Day Smoker    Packs/day: 0.50    Years: 24.00    Pack years: 12.00    Types: Cigarettes  . Smokeless tobacco: Never Used  . Tobacco comment: Cut back to 5 cig/day  Vaping Use  . Vaping Use: Former  Substance Use Topics  . Alcohol use: Yes    Comment: As of 08/05/19: on some weekends (typically 2-3 beers a sitting); Previously: ETOH abuse, No ETOH since 09/20/19  . Drug use: Yes    Types: Marijuana, Heroin, Oxycodone, Hydrocodone, Cocaine    Comment: accidental OD recently- heroin    Home Medications Prior to Admission medications   Medication Sig Start Date End Date Taking? Authorizing Provider  amLODipine (NORVASC) 10 MG tablet Take 10 mg by mouth daily.    [provider]  buPROPion (WELLBUTRIN XL) 300 MG 24 hr tablet Take 300 mg by mouth daily as needed (anxiety).  05/19/19   [provider]  chlordiazePOXIDE (LIBRIUM) 5 MG capsule Take 5 mg by mouth 3 (three) times daily as needed. 08/28/19   [provider]  gabapentin (NEURONTIN) 100 MG capsule Take 100 mg by mouth 3 (three) times daily as needed (pain).  06/23/19   [provider]  gabapentin (NEURONTIN) 100 MG capsule Take 1 capsule by mouth 2 (two) times daily. 10/09/19   [provider]  Glecaprevir-Pibrentasvir (MAVYRET) 100-40 MG TABS Take 3 tablets by mouth daily. Take 3 tablets by mouth daily with a meal. 08/06/19   Veryl Speak, FNP  ibuprofen (ADVIL) 800 MG tablet Take 1 tablet (800 mg total) by mouth every 8 (eight) hours as needed. 11/29/19   Elson Areas, PA-C  melatonin 5 MG TABS Take 5 mg by mouth at bedtime as  needed (sleep).    [provider]  methocarbamol (ROBAXIN) 500 MG tablet Take 1 tablet (500 mg total) by mouth 2 (two) times daily. 11/26/19   Gailen Shelter, PA  metoprolol tartrate (LOPRESSOR) 50 MG tablet Take 1.5 tablets (75 mg total) by mouth 2 (two) times daily. Patient taking differently: Take 75 mg by mouth 2 (two) times daily.  04/09/19 09/15/19  Laqueta Linden, MD  nicotine (NICODERM CQ - DOSED IN MG/24 HOURS) 21 mg/24hr patch Place  21 mg onto the skin daily.    [provider]  omeprazole (PRILOSEC) 40 MG capsule Take 1 capsule (40 mg total) by mouth daily. 09/28/19 09/27/20  Mansouraty, Netty StarringGabriel Jr., MD  ondansetron (ZOFRAN-ODT) 4 MG disintegrating tablet Take 1 tablet (4 mg total) by mouth every 8 (eight) hours as needed for nausea or vomiting. 11/09/19   Benjiman CorePickering, Nathan, MD    Allergies    Adhesive [tape]  Review of Systems   Review of Systems  Constitutional: Positive for chills and fever.  HENT: Negative for congestion, tinnitus, trouble swallowing and voice change.   Eyes: Negative for visual disturbance.  Respiratory: Positive for cough. Negative for shortness of breath.   Cardiovascular: Negative for chest pain.  Gastrointestinal: Negative for abdominal pain, diarrhea, nausea and vomiting.  Genitourinary: Negative for enuresis.  Musculoskeletal: Positive for myalgias. Negative for back pain.  Skin: Positive for wound. Negative for rash.  Neurological: Positive for headaches. Negative for dizziness.  Hematological: Does not bruise/bleed easily.    Physical Exam Updated Vital Signs BP (!) 149/96 (BP Location: Right Arm)   Pulse (!) 106   Temp 98.1 F (36.7 C) (Oral)   Resp 18   Ht 4\' 10"  (1.473 m)   Wt 43.5 kg   SpO2 100%   BMI 20.06 kg/m   Physical Exam Vitals and nursing note reviewed.  Constitutional:      General: She is not in acute distress.    Appearance: She is not ill-appearing.  HENT:     Head: Normocephalic and atraumatic.      Right Ear: Tympanic membrane, ear canal and external ear normal.     Left Ear: Tympanic membrane, ear canal and external ear normal.     Nose: No congestion or rhinorrhea.     Mouth/Throat:     Mouth: Mucous membranes are moist.     Pharynx: Oropharynx is clear. No oropharyngeal exudate or posterior oropharyngeal erythema.  Eyes:     General: No scleral icterus. Cardiovascular:     Rate and Rhythm: Normal rate and regular rhythm.     Pulses: Normal pulses.     Heart sounds: No murmur heard.  No friction rub. No gallop.   Pulmonary:     Effort: No respiratory distress.     Breath sounds: No wheezing, rhonchi or rales.  Abdominal:     General: There is no distension.     Palpations: Abdomen is soft.     Tenderness: There is no abdominal tenderness. There is no right CVA tenderness, left CVA tenderness or guarding.  Musculoskeletal:        General: No swelling or tenderness.     Right lower leg: No edema.     Left lower leg: No edema.  Skin:    General: Skin is warm and dry.     Findings: Lesion present. No rash.     Comments: Patient's face was visualized there is a small abrasion noted on her left cheek.  It had a small erythematous boader no drainage or discharge noted.  Area was palpated nontender with slight fluctuance and purulent drainage noted.  Patient's back was visualized and there is a small lesion noted on the right lower aspect of her back.  It had a small erythematous board with no drainage or discharge noted.  Area is palpated slightly tender to palpation with slight fluctuance, purulent drainage is noted.  Neurological:     Mental Status: She is alert.  Psychiatric:  Mood and Affect: Mood normal.     ED Results / Procedures / Treatments   Labs (all labs ordered are listed, but only abnormal results are displayed) Labs Reviewed  RESP PANEL BY RT PCR (RSV, FLU A&B, COVID)    EKG None  Radiology No results found.  Procedures Procedures  (including critical care time)  Medications Ordered in ED Medications - No data to display  ED Course  I have reviewed the triage vital signs and the nursing notes.  Pertinent labs & imaging results that were available during my care of the patient were reviewed by me and considered in my medical decision making (see chart for details).    MDM Rules/Calculators/A&P                          Patient presents with URI symptoms and bug bites.  She is alert, did not appear in acute distress, vital signs reassuring.  Will order respiratory panel for further evaluation.  Respiratory panel positive for Covid, negative for influenza A/B, no RSV.  Low suspicion for systemic infection as patient is nontoxic-appearing, vital signs reassuring, no obvious source infection noted on exam.  Low suspicion for pneumonia as lung sounds are clear bilaterally, I have low suspicion for PE as patient denies pleuritic chest pain, shortness of breath, denies leg pain, recent surgeries, no history of DVTs or PEs, no pedal edema noted on exam.  Low suspicion for strep throat as oropharynx was visualized, no erythema or exudates noted.  Low suspicion patient would need  hospitalized due to viral infection or Covid as vital signs reassuring, patient is not in respiratory distress.  Low suspicion form cellulitis or deep tissue infection as area was slightly erythematous around the border, no induration present.  Will recommend patient applies warm compresses over the area and applies antibiotic cream as prescribed by her PCP.  Will send patient home with precautions for Covid.  If she is Covid positive will refer to infusion clinic as she has COPD and hypertension.  Vital signs have remained stable, no indication for hospital admission.  Patient given at home care as well strict return precautions.  Patient verbalized that they understood agreed to said plan.    Final Clinical Impression(s) / ED Diagnoses Final diagnoses:   Viral respiratory illness  Bug bite, initial encounter    Rx / DC Orders ED Discharge Orders    None       Barnie Del 01/19/20 2042    Blane Ohara, MD 01/20/20 1325

## 2020-01-20 ENCOUNTER — Other Ambulatory Visit (HOSPITAL_COMMUNITY): Payer: Self-pay | Admitting: Family

## 2020-01-20 ENCOUNTER — Encounter: Payer: Self-pay | Admitting: Nurse Practitioner

## 2020-01-20 DIAGNOSIS — U071 COVID-19: Secondary | ICD-10-CM

## 2020-01-20 DIAGNOSIS — I10 Essential (primary) hypertension: Secondary | ICD-10-CM | POA: Insufficient documentation

## 2020-01-20 DIAGNOSIS — J449 Chronic obstructive pulmonary disease, unspecified: Secondary | ICD-10-CM | POA: Insufficient documentation

## 2020-01-20 NOTE — Progress Notes (Addendum)
I connected by phone with Hannah Rhodes on 01/20/2020 at 6:09 PM to discuss the potential use of a new treatment for mild to moderate COVID-19 viral infection in non-hospitalized patients.  This patient is a 39 y.o. female that meets the FDA criteria for Emergency Use Authorization of COVID monoclonal antibody.  Has a (+) direct SARS-CoV-2 viral test result  Has mild or moderate COVID-19   Is NOT hospitalized due to COVID-19  Is within 10 days of symptom onset  Has at least one of the high risk factor(s) for progression to severe COVID-19 and/or hospitalization as defined in EUA.  Specific high risk criteria : Cardiovascular disease or hypertension   Symptoms of H/A, fever, chills, aches, and cough began 01/18/20.   I have spoken and communicated the following to the patient or parent/caregiver regarding COVID monoclonal antibody treatment:  1. FDA has authorized the emergency use for the treatment of mild to moderate COVID-19 in adults and pediatric patients with positive results of direct SARS-CoV-2 viral testing who are 71 years of age and older weighing at least 40 kg, and who are at high risk for progressing to severe COVID-19 and/or hospitalization.  2. The significant known and potential risks and benefits of COVID monoclonal antibody, and the extent to which such potential risks and benefits are unknown.  3. Information on available alternative treatments and the risks and benefits of those alternatives, including clinical trials.  4. Patients treated with COVID monoclonal antibody should continue to self-isolate and use infection control measures (e.g., wear mask, isolate, social distance, avoid sharing personal items, clean and disinfect "high touch" surfaces, and frequent handwashing) according to CDC guidelines.   5. The patient or parent/caregiver has the option to accept or refuse COVID monoclonal antibody treatment.  After reviewing this information with the patient, the  patient has agreed to receive one of the available covid 19 monoclonal antibodies and will be provided an appropriate fact sheet prior to infusion. Morton Stall, NP 01/20/2020 6:09 PM

## 2020-01-21 ENCOUNTER — Other Ambulatory Visit: Payer: Medicaid Other

## 2020-01-21 ENCOUNTER — Ambulatory Visit (HOSPITAL_COMMUNITY): Payer: Medicaid Other

## 2020-01-22 ENCOUNTER — Ambulatory Visit (HOSPITAL_COMMUNITY): Payer: Medicaid Other | Attending: Pulmonary Disease

## 2020-01-23 ENCOUNTER — Encounter (HOSPITAL_COMMUNITY): Payer: Self-pay | Admitting: *Deleted

## 2020-01-23 ENCOUNTER — Emergency Department (HOSPITAL_COMMUNITY)
Admission: EM | Admit: 2020-01-23 | Discharge: 2020-01-23 | Disposition: A | Discharge status: Home / Self Care | Payer: Medicaid Other | Attending: Emergency Medicine | Admitting: Emergency Medicine

## 2020-01-23 ENCOUNTER — Other Ambulatory Visit: Payer: Self-pay

## 2020-01-23 ENCOUNTER — Emergency Department (HOSPITAL_COMMUNITY): Payer: Medicaid Other

## 2020-01-23 DIAGNOSIS — U071 COVID-19: Secondary | ICD-10-CM | POA: Insufficient documentation

## 2020-01-23 DIAGNOSIS — I10 Essential (primary) hypertension: Secondary | ICD-10-CM | POA: Insufficient documentation

## 2020-01-23 DIAGNOSIS — F17210 Nicotine dependence, cigarettes, uncomplicated: Secondary | ICD-10-CM | POA: Diagnosis not present

## 2020-01-23 DIAGNOSIS — J449 Chronic obstructive pulmonary disease, unspecified: Secondary | ICD-10-CM | POA: Insufficient documentation

## 2020-01-23 DIAGNOSIS — R5383 Other fatigue: Secondary | ICD-10-CM | POA: Diagnosis present

## 2020-01-23 DIAGNOSIS — R112 Nausea with vomiting, unspecified: Secondary | ICD-10-CM

## 2020-01-23 LAB — COMPREHENSIVE METABOLIC PANEL
ALT: 37 U/L (ref 0–44)
AST: 81 U/L — ABNORMAL HIGH (ref 15–41)
Albumin: 4.3 g/dL (ref 3.5–5.0)
Alkaline Phosphatase: 102 U/L (ref 38–126)
Anion gap: 14 (ref 5–15)
BUN: 15 mg/dL (ref 6–20)
CO2: 24 mmol/L (ref 22–32)
Calcium: 10.2 mg/dL (ref 8.9–10.3)
Chloride: 98 mmol/L (ref 98–111)
Creatinine, Ser: 0.79 mg/dL (ref 0.44–1.00)
GFR, Estimated: >60 mL/min (ref >60)
Glucose, Bld: 107 mg/dL — ABNORMAL HIGH (ref 70–99)
Potassium: 4.3 mmol/L (ref 3.5–5.1)
Sodium: 136 mmol/L (ref 135–145)
Total Bilirubin: 0.3 mg/dL (ref 0.3–1.2)
Total Protein: 7.9 g/dL (ref 6.5–8.1)

## 2020-01-23 LAB — TROPONIN I (HIGH SENSITIVITY)
Troponin I (High Sensitivity): <2 ng/L (ref <18)
Troponin I (High Sensitivity): 2 ng/L (ref <18)

## 2020-01-23 LAB — CBC WITH DIFFERENTIAL/PLATELET
Abs Immature Granulocytes: 0 10*3/uL (ref 0.00–0.07)
Basophils Absolute: 0 10*3/uL (ref 0.0–0.1)
Basophils Relative: 1 %
Eosinophils Absolute: 0 10*3/uL (ref 0.0–0.5)
Eosinophils Relative: 0 %
HCT: 43.1 % (ref 36.0–46.0)
Hemoglobin: 15.2 g/dL — ABNORMAL HIGH (ref 12.0–15.0)
Immature Granulocytes: 0 %
Lymphocytes Relative: 66 %
Lymphs Abs: 2.4 10*3/uL (ref 0.7–4.0)
MCH: 32.5 pg (ref 26.0–34.0)
MCHC: 35.3 g/dL (ref 30.0–36.0)
MCV: 92.1 fL (ref 80.0–100.0)
Monocytes Absolute: 0.3 10*3/uL (ref 0.1–1.0)
Monocytes Relative: 8 %
Neutro Abs: 0.9 10*3/uL — ABNORMAL LOW (ref 1.7–7.7)
Neutrophils Relative %: 25 %
Platelets: 210 10*3/uL (ref 150–400)
RBC: 4.68 MIL/uL (ref 3.87–5.11)
RDW: 16.1 % — ABNORMAL HIGH (ref 11.5–15.5)
WBC: 3.6 10*3/uL — ABNORMAL LOW (ref 4.0–10.5)
nRBC: 0 % (ref 0.0–0.2)

## 2020-01-23 MED ORDER — METHYLPREDNISOLONE SODIUM SUCC 125 MG IJ SOLR: 125.0000 mg | Freq: Once | INTRAMUSCULAR | Status: DC | PRN

## 2020-01-23 MED ORDER — SODIUM CHLORIDE 0.9 % IV SOLN: INTRAVENOUS | Status: DC | PRN

## 2020-01-23 MED ORDER — ALBUTEROL SULFATE HFA 108 (90 BASE) MCG/ACT IN AERS: 2.0000 | INHALATION_SPRAY | Freq: Once | RESPIRATORY_TRACT | Status: DC | PRN

## 2020-01-23 MED ORDER — BENZONATATE 100 MG PO CAPS: 100.0000 mg | ORAL_CAPSULE | Freq: Three times a day (TID) | ORAL | 0 refills | Status: DC

## 2020-01-23 MED ORDER — SODIUM CHLORIDE 0.9 % IV BOLUS
500.0000 mL | Freq: Once | INTRAVENOUS | Status: AC
  Administered 2020-01-23: 500 mL via INTRAVENOUS

## 2020-01-23 MED ORDER — ONDANSETRON HCL 4 MG/2ML IJ SOLN
4.0000 mg | Freq: Once | INTRAMUSCULAR | Status: AC
  Administered 2020-01-23: 4 mg via INTRAVENOUS
  Filled 2020-01-23: qty 2

## 2020-01-23 MED ORDER — ALBUTEROL SULFATE HFA 108 (90 BASE) MCG/ACT IN AERS
1.0000 | INHALATION_SPRAY | Freq: Four times a day (QID) | RESPIRATORY_TRACT | 0 refills | Status: DC | PRN
Start: 2020-01-23 — End: 2020-09-12

## 2020-01-23 MED ORDER — EPINEPHRINE 0.3 MG/0.3ML IJ SOAJ: 0.3000 mg | Freq: Once | INTRAMUSCULAR | Status: DC | PRN

## 2020-01-23 MED ORDER — FAMOTIDINE IN NACL 20-0.9 MG/50ML-% IV SOLN: 20.0000 mg | Freq: Once | INTRAVENOUS | Status: DC | PRN

## 2020-01-23 MED ORDER — SODIUM CHLORIDE 0.9 % IV SOLN
1200.0000 mg | Freq: Once | INTRAVENOUS | Status: AC
  Administered 2020-01-23: 1200 mg via INTRAVENOUS
  Filled 2020-01-23: qty 10

## 2020-01-23 MED ORDER — ONDANSETRON 4 MG PO TBDP: 4.0000 mg | ORAL_TABLET | Freq: Three times a day (TID) | ORAL | 0 refills | Status: DC | PRN

## 2020-01-23 MED ORDER — DIPHENHYDRAMINE HCL 50 MG/ML IJ SOLN: 50.0000 mg | Freq: Once | INTRAMUSCULAR | Status: DC | PRN

## 2020-01-23 NOTE — ED Provider Notes (Signed)
Emergency Department Provider Note   I have reviewed the triage vital signs and the nursing notes.   HISTORY  Chief Complaint No chief complaint on file.   HPI Hannah Rhodes is a 39 y.o. female with past medical history of COPD, hypertension, and Hep C presents to the emergency department with fatigue, vomiting, cough, and body aches in the setting of known COVID-19 infection. Patient was diagnosed with Covid on 11/2 shortly after developing symptoms. She had mild symptoms to start but cough and some vomiting have worsened in the last several days. MAB infusion was ordered and patient was due to have this yesterday but felt too ill to go. She denies severe SOB. No CP. No syncope. Patient has had difficulty sleeping and keeping food down. No radiation of symptoms or modifying factors.   Past Medical History:  Diagnosis Date  . Alcoholism (HCC)   . Anxiety   . Chronic hepatitis C (HCC)   . COPD (chronic obstructive pulmonary disease) (HCC)    no inaler  . DDD (degenerative disc disease), lumbar   . Fatty liver   . Generalized headaches   . GERD (gastroesophageal reflux disease)    diet controlled, no med  . Heroin use   . HPV (human papilloma virus) infection   . Hypertension   . Insomnia   . Panic attack   . Pneumonia 03/2010  . Smoker   . Tachycardia     Patient Active Problem List   Diagnosis Date Noted  . COPD (chronic obstructive pulmonary disease) (HCC)   . Hypertension   . Pancreatic cyst 07/22/2019  . Chronic abdominal pain 07/22/2019  . Abnormal LFTs 07/22/2019  . Abnormal MRI of abdomen 07/22/2019  . Bile duct stricture 07/22/2019  . Common bile duct dilation 07/22/2019  . Hepatitis C, chronic (HCC) 05/06/2019  . Abnormal ultrasound of abdomen 05/06/2019  . Hepatitis C antibody positive in blood 03/10/2019  . Alcoholism (HCC) 03/10/2019  . Loss of weight 03/10/2019  . DYSPNEA 10/24/2009  . ANXIETY STATE, UNSPECIFIED 10/21/2009  . NONDEPENDENT TOBACCO  USE DISORDER 10/21/2009  . INSOMNIA UNSPECIFIED 10/21/2009  . PALPITATIONS 10/21/2009    Past Surgical History:  Procedure Laterality Date  . BIOPSY  09/28/2019   Procedure: BIOPSY;  Surgeon: Meridee Score Netty Starring., MD;  Location: [facility] ENDOSCOPY;  Service: Gastroenterology;;  . CESAREAN SECTION    . CESAREAN SECTION    . ESOPHAGOGASTRODUODENOSCOPY (EGD) WITH PROPOFOL N/A 09/28/2019   Procedure: ESOPHAGOGASTRODUODENOSCOPY (EGD) WITH PROPOFOL;  Surgeon: Meridee Score Netty Starring., MD;  Location: [facility] ENDOSCOPY;  Service: Gastroenterology;  Laterality: N/A;  . EUS  09/28/2019   Procedure: UPPER ENDOSCOPIC ULTRASOUND (EUS) LINEAR;  Surgeon: Lemar Lofty., MD;  Location: [facility] ENDOSCOPY;  Service: Gastroenterology;;  . EUS  09/28/2019   Procedure: FULL UPPER ENDOSCOPIC ULTRASOUND (EUS) RADIAL;  Surgeon: Lemar Lofty., MD;  Location: [facility] ENDOSCOPY;  Service: Gastroenterology;;  . FINE NEEDLE ASPIRATION  09/28/2019   Procedure: FINE NEEDLE ASPIRATION (FNA) LINEAR;  Surgeon: Lemar Lofty., MD;  Location: [facility] ENDOSCOPY;  Service: Gastroenterology;;  . SMALL INTESTINE SURGERY    . WISDOM TOOTH EXTRACTION      Allergies Adhesive [tape]  Family History  Problem Relation Age of Onset  . Anxiety disorder Sister   . Restless legs syndrome Sister   . Heart failure Mother   . CAD Mother   . Heart attack Mother   . Colon cancer Neg Hx   . Esophageal cancer Neg Hx   . Inflammatory bowel  disease Neg Hx   . Liver disease Neg Hx   . Pancreatic cancer Neg Hx   . Stomach cancer Neg Hx   . Rectal cancer Neg Hx     Social History Social History   Tobacco Use  . Smoking status: Current Every Day Smoker    Packs/day: 0.50    Years: 24.00    Pack years: 12.00    Types: Cigarettes  . Smokeless tobacco: Never Used  . Tobacco comment: Cut back to 5 cig/day  Vaping Use  . Vaping Use: Former  Substance Use Topics  . Alcohol use: Yes    Comment: As of 08/05/19: on some weekends  (typically 2-3 beers a sitting); Previously: ETOH abuse, No ETOH since 09/20/19  . Drug use: Yes    Types: Marijuana, Heroin, Oxycodone, Hydrocodone, Cocaine    Comment: accidental OD recently- heroin    Review of Systems  Constitutional: No fever/chills Eyes: No visual changes. ENT: No sore throat. Cardiovascular: Denies chest pain. Respiratory: Mild shortness of breath with frequent cough.  Gastrointestinal: No abdominal pain. Positive nausea and vomiting.  No diarrhea.  No constipation. Genitourinary: Negative for dysuria. Musculoskeletal: Negative for back pain. Positive diffuse body aches.  Skin: Negative for rash. Neurological: Negative for focal weakness or numbness. Positive HA.   10-point ROS otherwise negative.  ____________________________________________   PHYSICAL EXAM:  VITAL SIGNS: ED Triage Vitals  Enc Vitals Group     BP 01/23/20 0959 105/81     Pulse Rate 01/23/20 0959 94     Resp 01/23/20 0959 18     Temp 01/23/20 0959 98 F (36.7 C)     Temp Source 01/23/20 0959 Oral     SpO2 01/23/20 0959 98 %     Weight 01/23/20 0959 95 lb (43.1 kg)     Height 01/23/20 0959 4\' 10"  (1.473 m)   Constitutional: Alert and oriented. Well appearing and in no acute distress. Eyes: Conjunctivae are normal.  Head: Atraumatic. Nose: No congestion/rhinnorhea. Mouth/Throat: Mucous membranes are moist.  Neck: No stridor.   Cardiovascular: Normal rate, regular rhythm. Good peripheral circulation. Grossly normal heart sounds.   Respiratory: Normal respiratory effort.  No retractions. Lungs CTAB. Gastrointestinal: Soft and nontender. No distention.  Musculoskeletal: No lower extremity tenderness nor edema. No gross deformities of extremities. Neurologic:  Normal speech and language. No gross focal neurologic deficits are appreciated.  Skin:  Skin is warm, dry and intact. No rash noted. Psychiatric: Mood and affect are normal. Speech and behavior are  normal.  ____________________________________________   LABS (all labs ordered are listed, but only abnormal results are displayed)  Labs Reviewed  COMPREHENSIVE METABOLIC PANEL - Abnormal; Notable for the following components:      Result Value   Glucose, Bld 107 (*)    AST 81 (*)    All other components within normal limits  CBC WITH DIFFERENTIAL/PLATELET - Abnormal; Notable for the following components:   WBC 3.6 (*)    Hemoglobin 15.2 (*)    RDW 16.1 (*)    Neutro Abs 0.9 (*)    All other components within normal limits  TROPONIN I (HIGH SENSITIVITY)  TROPONIN I (HIGH SENSITIVITY)   ____________________________________________  EKG   EKG Interpretation  Date/Time:  Saturday January 23 2020 13:03:57 EDT Ventricular Rate:  84 PR Interval:    QRS Duration: 73 QT Interval:  401 QTC Calculation: 474 R Axis:     Text Interpretation: Sinus rhythm No STEMI Confirmed by [id number] [phone]) on 01/23/2020  2:25:24 PM       ____________________________________________  RADIOLOGY  DG Chest Portable 1 View  Result Date: 01/23/2020 CLINICAL DATA:  Pt c/o SOB, dx with COVID on 11/2 EXAM: PORTABLE CHEST 1 VIEW COMPARISON:  Chest radiograph 11/26/2019 FINDINGS: The cardiomediastinal contours are within normal limits. The lungs are clear. No pneumothorax or pleural effusion. No acute finding in the visualized skeleton. IMPRESSION: No evidence of active disease. Electronically Signed   By: Emmaline Kluver M.D.   On: 01/23/2020 13:36    ____________________________________________   PROCEDURES  Procedure(s) performed:   Procedures  None  ____________________________________________   INITIAL IMPRESSION / ASSESSMENT AND PLAN / ED COURSE  Pertinent labs & imaging results that were available during my care of the patient were reviewed by me and considered in my medical decision making (see chart for details).   Patient with known COVID 19 infection presents with  worsening cough and vomiting. No abdominal tenderness. Patient not encephalopathic. No increased WOB or CP. Vitals are WNL here with no hypoxemia. CXR without infiltrate. Patient with HTN history and COPD. Plan for MAB infusion here.   I reviewed all nursing notes, vitals, pertinent old records, EKGs, labs, imaging (as available).  ____________________________________________  FINAL CLINICAL IMPRESSION(S) / ED DIAGNOSES  Final diagnoses:  COVID-19  Non-intractable vomiting with nausea, unspecified vomiting type     MEDICATIONS GIVEN DURING THIS VISIT:  Medications  sodium chloride 0.9 % bolus 500 mL (0 mLs Intravenous Stopped 01/23/20 1340)  ondansetron (ZOFRAN) injection 4 mg (4 mg Intravenous Given 01/23/20 1243)  casirivimab-imdevimab (REGEN-COV) 1,200 mg in sodium chloride 0.9 % 110 mL IVPB (0 mg Intravenous Stopped 01/23/20 1445)     NEW OUTPATIENT MEDICATIONS STARTED DURING THIS VISIT:  Discharge Medication List as of 01/23/2020  4:14 PM    START taking these medications   Details  albuterol (VENTOLIN HFA) 108 (90 Base) MCG/ACT inhaler Inhale 1-2 puffs into the lungs every 6 (six) hours as needed for wheezing or shortness of breath., Starting Sat 01/23/2020, Normal    benzonatate (TESSALON) 100 MG capsule Take 1 capsule (100 mg total) by mouth every 8 (eight) hours., Starting Sat 01/23/2020, Normal        Note:  This document was prepared using Dragon voice recognition software and may include unintentional dictation errors.  Alona Bene, MD, [facility] Emergency Medicine    Loic Hobin, Arlyss Repress, MD 01/24/20 [id number]

## 2020-01-23 NOTE — ED Triage Notes (Signed)
States she tested positive for covid on 11/02. States she was scheduled for antibodies yesterday and did not have a ride

## 2020-01-23 NOTE — Discharge Instructions (Addendum)
Person Under Monitoring Name: Hannah Rhodes  Location: [address] [place] [address]   Infection Prevention Recommendations for Individuals Confirmed to have, or Being Evaluated for, 2019 Novel Coronavirus (COVID-19) Infection Who Receive Care at Home  Individuals who are confirmed to have, or are being evaluated for, COVID-19 should follow the prevention steps below until a healthcare provider or local or state health department says they can return to normal activities.  Stay home except to get medical care You should restrict activities outside your home, except for getting medical care. Do not go to work, school, or public areas, and do not use public transportation or taxis.  Call ahead before visiting your doctor Before your medical appointment, call the healthcare provider and tell them that you have, or are being evaluated for, COVID-19 infection. This will help the healthcare provider's office take steps to keep other people from getting infected. Ask your healthcare provider to call the local or state health department.  Monitor your symptoms Seek prompt medical attention if your illness is worsening (e.g., difficulty breathing). Before going to your medical appointment, call the healthcare provider and tell them that you have, or are being evaluated for, COVID-19 infection. Ask your healthcare provider to call the local or state health department.  Wear a facemask You should wear a facemask that covers your nose and mouth when you are in the same room with other people and when you visit a healthcare provider. People who live with or visit you should also wear a facemask while they are in the same room with you.  Separate yourself from other people in your home As much as possible, you should stay in a different room from other people in your home. Also, you should use a separate bathroom, if available.  Avoid sharing household items You should not share  dishes, drinking glasses, cups, eating utensils, towels, bedding, or other items with other people in your home. After using these items, you should wash them thoroughly with soap and water.  Cover your coughs and sneezes Cover your mouth and nose with a tissue when you cough or sneeze, or you can cough or sneeze into your sleeve. Throw used tissues in a lined trash can, and immediately wash your hands with soap and water for at least 20 seconds or use an alcohol-based hand rub.  Wash your [employer] your hands often and thoroughly with soap and water for at least 20 seconds. You can use an alcohol-based hand sanitizer if soap and water are not available and if your hands are not visibly dirty. Avoid touching your eyes, nose, and mouth with unwashed hands.   Prevention Steps for Caregivers and Household Members of Individuals Confirmed to have, or Being Evaluated for, COVID-19 Infection Being Cared for in the Home  If you live with, or provide care at home for, a person confirmed to have, or being evaluated for, COVID-19 infection please follow these guidelines to prevent infection:  Follow healthcare provider's instructions Make sure that you understand and can help the patient follow any healthcare provider instructions for all care.  Provide for the patient's basic needs You should help the patient with basic needs in the home and provide support for getting groceries, prescriptions, and other personal needs.  Monitor the patient's symptoms If they are getting sicker, call his or her medical provider and tell them that the patient has, or is being evaluated for, COVID-19 infection. This will help the healthcare provider's office  take steps to keep other people from getting infected. Ask the healthcare provider to call the local or state health department.  Limit the number of people who have contact with the patient If possible, have only one caregiver for the patient. Other  household members should stay in another home or place of residence. If this is not possible, they should stay in another room, or be separated from the patient as much as possible. Use a separate bathroom, if available. Restrict visitors who do not have an essential need to be in the home.  Keep older adults, very young children, and other sick people away from the patient Keep older adults, very young children, and those who have compromised immune systems or chronic health conditions away from the patient. This includes people with chronic heart, lung, or kidney conditions, diabetes, and cancer.  Ensure good ventilation Make sure that shared spaces in the home have good air flow, such as from an air conditioner or an opened window, weather permitting.  Wash your hands often Wash your hands often and thoroughly with soap and water for at least 20 seconds. You can use an alcohol based hand sanitizer if soap and water are not available and if your hands are not visibly dirty. Avoid touching your eyes, nose, and mouth with unwashed hands. Use disposable paper towels to dry your hands. If not available, use dedicated cloth towels and replace them when they become wet.  Wear a facemask and gloves Wear a disposable facemask at all times in the room and gloves when you touch or have contact with the patient's blood, body fluids, and/or secretions or excretions, such as sweat, saliva, sputum, nasal mucus, vomit, urine, or feces.  Ensure the mask fits over your nose and mouth tightly, and do not touch it during use. Throw out disposable facemasks and gloves after using them. Do not reuse. Wash your hands immediately after removing your facemask and gloves. If your personal clothing becomes contaminated, carefully remove clothing and launder. Wash your hands after handling contaminated clothing. Place all used disposable facemasks, gloves, and other waste in a lined container before disposing them with  other household waste. Remove gloves and wash your hands immediately after handling these items.  Do not share dishes, glasses, or other household items with the patient Avoid sharing household items. You should not share dishes, drinking glasses, cups, eating utensils, towels, bedding, or other items with a patient who is confirmed to have, or being evaluated for, COVID-19 infection. After the person uses these items, you should wash them thoroughly with soap and water.  Wash laundry thoroughly Immediately remove and wash clothes or bedding that have blood, body fluids, and/or secretions or excretions, such as sweat, saliva, sputum, nasal mucus, vomit, urine, or feces, on them. Wear gloves when handling laundry from the patient. Read and follow directions on labels of laundry or clothing items and detergent. In general, wash and dry with the warmest temperatures recommended on the label.  Clean all areas the individual has used often Clean all touchable surfaces, such as counters, tabletops, doorknobs, bathroom fixtures, toilets, phones, keyboards, tablets, and bedside tables, every day. Also, clean any surfaces that may have blood, body fluids, and/or secretions or excretions on them. Wear gloves when cleaning surfaces the patient has come in contact with. Use a diluted bleach solution (e.g., dilute bleach with 1 part bleach and 10 parts water) or a household disinfectant with a label that says EPA-registered for coronaviruses. To make a bleach  solution at home, add 1 tablespoon of bleach to 1 quart (4 cups) of water. For a larger supply, add  cup of bleach to 1 gallon (16 cups) of water. Read labels of cleaning products and follow recommendations provided on product labels. Labels contain instructions for safe and effective use of the cleaning product including precautions you should take when applying the product, such as wearing gloves or eye protection and making sure you have good ventilation  during use of the product. Remove gloves and wash hands immediately after cleaning.  Monitor yourself for signs and symptoms of illness Caregivers and household members are considered close contacts, should monitor their health, and will be asked to limit movement outside of the home to the extent possible. Follow the monitoring steps for close contacts listed on the symptom monitoring form.   ? If you have additional questions, contact your local health department or call the epidemiologist on call at [phone] (available 24/7). ? This guidance is subject to change. For the most up-to-date guidance from [facility], please refer to their website: [email or web address].pl

## 2020-01-27 ENCOUNTER — Emergency Department (HOSPITAL_COMMUNITY): Payer: Medicaid Other

## 2020-01-27 ENCOUNTER — Encounter (HOSPITAL_COMMUNITY): Payer: Self-pay | Admitting: Emergency Medicine

## 2020-01-27 ENCOUNTER — Observation Stay (HOSPITAL_COMMUNITY)
Admission: EM | Admit: 2020-01-27 | Discharge: 2020-01-28 | DRG: 640 | Discharge status: Against Medical Advice | Payer: Medicaid Other | Attending: Family Medicine | Admitting: Family Medicine

## 2020-01-27 ENCOUNTER — Other Ambulatory Visit: Payer: Self-pay

## 2020-01-27 DIAGNOSIS — F410 Panic disorder [episodic paroxysmal anxiety] without agoraphobia: Secondary | ICD-10-CM | POA: Diagnosis not present

## 2020-01-27 DIAGNOSIS — E872 Acidosis, unspecified: Secondary | ICD-10-CM | POA: Diagnosis present

## 2020-01-27 DIAGNOSIS — F17210 Nicotine dependence, cigarettes, uncomplicated: Secondary | ICD-10-CM | POA: Diagnosis present

## 2020-01-27 DIAGNOSIS — U071 COVID-19: Secondary | ICD-10-CM

## 2020-01-27 DIAGNOSIS — K219 Gastro-esophageal reflux disease without esophagitis: Secondary | ICD-10-CM | POA: Diagnosis present

## 2020-01-27 DIAGNOSIS — I10 Essential (primary) hypertension: Secondary | ICD-10-CM | POA: Diagnosis present

## 2020-01-27 DIAGNOSIS — Z8249 Family history of ischemic heart disease and other diseases of the circulatory system: Secondary | ICD-10-CM | POA: Diagnosis not present

## 2020-01-27 DIAGNOSIS — E519 Thiamine deficiency, unspecified: Secondary | ICD-10-CM | POA: Diagnosis present

## 2020-01-27 DIAGNOSIS — F1910 Other psychoactive substance abuse, uncomplicated: Secondary | ICD-10-CM | POA: Diagnosis present

## 2020-01-27 DIAGNOSIS — R000 Tachycardia, unspecified: Secondary | ICD-10-CM | POA: Diagnosis present

## 2020-01-27 DIAGNOSIS — E876 Hypokalemia: Secondary | ICD-10-CM | POA: Diagnosis present

## 2020-01-27 DIAGNOSIS — Z79899 Other long term (current) drug therapy: Secondary | ICD-10-CM

## 2020-01-27 DIAGNOSIS — K760 Fatty (change of) liver, not elsewhere classified: Secondary | ICD-10-CM | POA: Diagnosis present

## 2020-01-27 DIAGNOSIS — E871 Hypo-osmolality and hyponatremia: Secondary | ICD-10-CM | POA: Diagnosis present

## 2020-01-27 DIAGNOSIS — R9431 Abnormal electrocardiogram [ECG] [EKG]: Secondary | ICD-10-CM | POA: Diagnosis not present

## 2020-01-27 DIAGNOSIS — B182 Chronic viral hepatitis C: Secondary | ICD-10-CM | POA: Diagnosis present

## 2020-01-27 DIAGNOSIS — R0602 Shortness of breath: Secondary | ICD-10-CM

## 2020-01-27 DIAGNOSIS — F1190 Opioid use, unspecified, uncomplicated: Secondary | ICD-10-CM | POA: Diagnosis not present

## 2020-01-27 DIAGNOSIS — Z818 Family history of other mental and behavioral disorders: Secondary | ICD-10-CM | POA: Diagnosis not present

## 2020-01-27 DIAGNOSIS — F1020 Alcohol dependence, uncomplicated: Secondary | ICD-10-CM | POA: Diagnosis present

## 2020-01-27 DIAGNOSIS — J449 Chronic obstructive pulmonary disease, unspecified: Secondary | ICD-10-CM | POA: Diagnosis not present

## 2020-01-27 DIAGNOSIS — F419 Anxiety disorder, unspecified: Secondary | ICD-10-CM

## 2020-01-27 LAB — BLOOD GAS, VENOUS
Acid-base deficit: 1.6 mmol/L (ref 0.0–2.0)
Bicarbonate: 22.4 mmol/L (ref 20.0–28.0)
FIO2: 95
O2 Saturation: 54.7 %
Patient temperature: 37
pCO2, Ven: 37.2 mmHg — ABNORMAL LOW (ref 44.0–60.0)
pH, Ven: 7.4 (ref 7.250–7.430)
pO2, Ven: 31.6 mmHg — CL (ref 32.0–45.0)

## 2020-01-27 LAB — HEPATIC FUNCTION PANEL
ALT: 26 U/L (ref 0–44)
AST: 54 U/L — ABNORMAL HIGH (ref 15–41)
Albumin: 4.1 g/dL (ref 3.5–5.0)
Alkaline Phosphatase: 80 U/L (ref 38–126)
Bilirubin, Direct: 0.1 mg/dL (ref 0.0–0.2)
Indirect Bilirubin: 0.4 mg/dL (ref 0.3–0.9)
Total Bilirubin: 0.5 mg/dL (ref 0.3–1.2)
Total Protein: 7.7 g/dL (ref 6.5–8.1)

## 2020-01-27 LAB — CBC WITH DIFFERENTIAL/PLATELET
Abs Immature Granulocytes: 0.01 10*3/uL (ref 0.00–0.07)
Basophils Absolute: 0 10*3/uL (ref 0.0–0.1)
Basophils Relative: 1 %
Eosinophils Absolute: 0.1 10*3/uL (ref 0.0–0.5)
Eosinophils Relative: 1 %
HCT: 40.9 % (ref 36.0–46.0)
Hemoglobin: 14 g/dL (ref 12.0–15.0)
Immature Granulocytes: 0 %
Lymphocytes Relative: 57 %
Lymphs Abs: 3.4 10*3/uL (ref 0.7–4.0)
MCH: 31.7 pg (ref 26.0–34.0)
MCHC: 34.2 g/dL (ref 30.0–36.0)
MCV: 92.5 fL (ref 80.0–100.0)
Monocytes Absolute: 0.4 10*3/uL (ref 0.1–1.0)
Monocytes Relative: 7 %
Neutro Abs: 2 10*3/uL (ref 1.7–7.7)
Neutrophils Relative %: 34 %
Platelets: 134 10*3/uL — ABNORMAL LOW (ref 150–400)
RBC: 4.42 MIL/uL (ref 3.87–5.11)
RDW: 15.4 % (ref 11.5–15.5)
WBC: 6 10*3/uL (ref 4.0–10.5)
nRBC: 0 % (ref 0.0–0.2)

## 2020-01-27 LAB — LIPASE, BLOOD: Lipase: 29 U/L (ref 11–51)

## 2020-01-27 LAB — BASIC METABOLIC PANEL
Anion gap: >20 — ABNORMAL HIGH (ref 5–15)
BUN: 8 mg/dL (ref 6–20)
CO2: 18 mmol/L — ABNORMAL LOW (ref 22–32)
Calcium: 8.7 mg/dL — ABNORMAL LOW (ref 8.9–10.3)
Chloride: 95 mmol/L — ABNORMAL LOW (ref 98–111)
Creatinine, Ser: 0.75 mg/dL (ref 0.44–1.00)
GFR, Estimated: >60 mL/min (ref >60)
Glucose, Bld: 125 mg/dL — ABNORMAL HIGH (ref 70–99)
Potassium: 2.3 mmol/L — CL (ref 3.5–5.1)
Sodium: 134 mmol/L — ABNORMAL LOW (ref 135–145)

## 2020-01-27 LAB — ETHANOL: Alcohol, Ethyl (B): <10 mg/dL (ref <10)

## 2020-01-27 LAB — TROPONIN I (HIGH SENSITIVITY)
Troponin I (High Sensitivity): 3 ng/L (ref <18)
Troponin I (High Sensitivity): 3 ng/L (ref <18)

## 2020-01-27 LAB — MAGNESIUM: Magnesium: 1.2 mg/dL — ABNORMAL LOW (ref 1.7–2.4)

## 2020-01-27 LAB — D-DIMER, QUANTITATIVE: D-Dimer, Quant: 0.66 ug/mL-FEU — ABNORMAL HIGH (ref 0.00–0.50)

## 2020-01-27 LAB — HCG, SERUM, QUALITATIVE: Preg, Serum: NEGATIVE

## 2020-01-27 LAB — TSH: TSH: 1.991 u[IU]/mL (ref 0.350–4.500)

## 2020-01-27 LAB — LACTIC ACID, PLASMA: Lactic Acid, Venous: 6.9 mmol/L (ref 0.5–1.9)

## 2020-01-27 MED ORDER — LORAZEPAM 2 MG/ML IJ SOLN
1.0000 mg | Freq: Once | INTRAMUSCULAR | Status: AC
  Administered 2020-01-27: 1 mg via INTRAVENOUS
  Filled 2020-01-27: qty 1

## 2020-01-27 MED ORDER — DEXAMETHASONE SODIUM PHOSPHATE 10 MG/ML IJ SOLN
10.0000 mg | Freq: Once | INTRAMUSCULAR | Status: AC
  Administered 2020-01-27: 10 mg via INTRAVENOUS
  Filled 2020-01-27: qty 1

## 2020-01-27 MED ORDER — METOPROLOL TARTRATE 5 MG/5ML IV SOLN
5.0000 mg | Freq: Once | INTRAVENOUS | Status: DC
  Filled 2020-01-27: qty 5

## 2020-01-27 MED ORDER — LACTATED RINGERS IV BOLUS
500.0000 mL | Freq: Once | INTRAVENOUS | Status: AC
  Administered 2020-01-27: 500 mL via INTRAVENOUS

## 2020-01-27 MED ORDER — IPRATROPIUM BROMIDE HFA 17 MCG/ACT IN AERS
2.0000 | INHALATION_SPRAY | Freq: Once | RESPIRATORY_TRACT | Status: DC
  Filled 2020-01-27: qty 12.9

## 2020-01-27 MED ORDER — MAGNESIUM SULFATE 2 GM/50ML IV SOLN
2.0000 g | Freq: Once | INTRAVENOUS | Status: AC
  Administered 2020-01-27: 2 g via INTRAVENOUS
  Filled 2020-01-27: qty 50

## 2020-01-27 MED ORDER — IOHEXOL 350 MG/ML SOLN
100.0000 mL | Freq: Once | INTRAVENOUS | Status: AC | PRN
  Administered 2020-01-27: 100 mL via INTRAVENOUS

## 2020-01-27 MED ORDER — ALBUTEROL SULFATE HFA 108 (90 BASE) MCG/ACT IN AERS
2.0000 | INHALATION_SPRAY | Freq: Once | RESPIRATORY_TRACT | Status: AC
  Administered 2020-01-27: 2 via RESPIRATORY_TRACT
  Filled 2020-01-27: qty 6.7

## 2020-01-27 MED ORDER — METHYLPREDNISOLONE SODIUM SUCC 125 MG IJ SOLR
125.0000 mg | Freq: Once | INTRAMUSCULAR | Status: AC
  Administered 2020-01-27: 125 mg via INTRAVENOUS
  Filled 2020-01-27: qty 2

## 2020-01-27 MED ORDER — POTASSIUM CHLORIDE 10 MEQ/100ML IV SOLN
10.0000 meq | INTRAVENOUS | Status: AC
  Administered 2020-01-27 (×2): 10 meq via INTRAVENOUS
  Filled 2020-01-27 (×2): qty 100

## 2020-01-27 MED ORDER — SODIUM CHLORIDE 0.9 % IV SOLN: INTRAVENOUS | Status: DC

## 2020-01-27 MED ORDER — ALBUTEROL SULFATE HFA 108 (90 BASE) MCG/ACT IN AERS
6.0000 | INHALATION_SPRAY | Freq: Once | RESPIRATORY_TRACT | Status: AC
  Administered 2020-01-27: 6 via RESPIRATORY_TRACT

## 2020-01-27 NOTE — ED Notes (Signed)
Date and time results received: 01/27/20 2225(use smartphrase ".now" to insert current time) Test: Lactic Critical Value: 6.9  Name of Provider Notified: Margarette Asal PA Orders Received? Or Actions Taken?: no

## 2020-01-27 NOTE — ED Notes (Signed)
Patient given ice chips per request.

## 2020-01-27 NOTE — ED Triage Notes (Signed)
Pt tested positive for Covid on 11/2. Received antibody infusion on 11/6. Pt reports increased SOB starting 3 hrs ago. EMS reports sats 98-100% on RA while en route. Pt has history of anxiety and tachycardia at rest with baseline heart rate of 110s(per pt report).

## 2020-01-27 NOTE — ED Notes (Signed)
Received care of patient resting in bed.  0 s/s acute distress.  Sp02 97% on room air.  Patient denies needs at this time.  Call bell in reach.

## 2020-01-27 NOTE — ED Provider Notes (Cosign Needed)
[facility] EMERGENCY DEPARTMENT Provider Note   CSN: [id number] Arrival date & time: 01/27/20  1853    History SOB  Hannah Rhodes is a 39 y.o. female with past medical history significant for anxiety, chronic hepatitis C, COPD, hypertension, panic attacks, tobacco use, recent Covid diagnosis who presents for evaluation of shortness of breath.  Patient states she was washing her dog for approximately 3 hours ago when she developed sudden worsening shortness of breath.  Patient denies any pain.  Patient states she feels like her heart is racing which is causing her to have anxiety.  She feels like her bilateral upper and lower extremities are trembling.  Does have history of alcohol use however denies any DTs, withdrawal seizures.  History of heroin use however denies any current polysubstance use.  States she is used albuterol at home thinking her shortness of breath was due to her COPD.  States this has not helped.  She has had nonproductive cough for has a chronic cough.  She continues to use tobacco.  Denies fever, chills, nausea, vomiting, chest pain, mops his abdominal pain, diarrhea, dysuria, lateral leg swelling, redness or warmth.  No prior history of PE or DVT.  Denies additional aggravating or alleviating factors.  Hx of sinus tachycardia.  History obtained from patient and past medical records.  No interpreter used.  HPI     Past Medical History:  Diagnosis Date  . Alcoholism (HCC)   . Anxiety   . Chronic hepatitis C (HCC)   . COPD (chronic obstructive pulmonary disease) (HCC)    no inaler  . DDD (degenerative disc disease), lumbar   . Fatty liver   . Generalized headaches   . GERD (gastroesophageal reflux disease)    diet controlled, no med  . Heroin use   . HPV (human papilloma virus) infection   . Hypertension   . Insomnia   . Panic attack   . Pneumonia 03/2010  . Smoker   . Tachycardia     Patient Active Problem List   Diagnosis Date Noted  . COPD (chronic  obstructive pulmonary disease) (HCC)   . Hypertension   . Pancreatic cyst 07/22/2019  . Chronic abdominal pain 07/22/2019  . Abnormal LFTs 07/22/2019  . Abnormal MRI of abdomen 07/22/2019  . Bile duct stricture 07/22/2019  . Common bile duct dilation 07/22/2019  . Hepatitis C, chronic (HCC) 05/06/2019  . Abnormal ultrasound of abdomen 05/06/2019  . Hepatitis C antibody positive in blood 03/10/2019  . Alcoholism (HCC) 03/10/2019  . Loss of weight 03/10/2019  . DYSPNEA 10/24/2009  . ANXIETY STATE, UNSPECIFIED 10/21/2009  . NONDEPENDENT TOBACCO USE DISORDER 10/21/2009  . INSOMNIA UNSPECIFIED 10/21/2009  . PALPITATIONS 10/21/2009    Past Surgical History:  Procedure Laterality Date  . BIOPSY  09/28/2019   Procedure: BIOPSY;  Surgeon: Meridee Score Netty Starring., MD;  Location: [facility] ENDOSCOPY;  Service: Gastroenterology;;  . CESAREAN SECTION    . CESAREAN SECTION    . ESOPHAGOGASTRODUODENOSCOPY (EGD) WITH PROPOFOL N/A 09/28/2019   Procedure: ESOPHAGOGASTRODUODENOSCOPY (EGD) WITH PROPOFOL;  Surgeon: Meridee Score Netty Starring., MD;  Location: [facility] ENDOSCOPY;  Service: Gastroenterology;  Laterality: N/A;  . EUS  09/28/2019   Procedure: UPPER ENDOSCOPIC ULTRASOUND (EUS) LINEAR;  Surgeon: Lemar Lofty., MD;  Location: [facility] ENDOSCOPY;  Service: Gastroenterology;;  . EUS  09/28/2019   Procedure: FULL UPPER ENDOSCOPIC ULTRASOUND (EUS) RADIAL;  Surgeon: Lemar Lofty., MD;  Location: [facility] ENDOSCOPY;  Service: Gastroenterology;;  . FINE NEEDLE ASPIRATION  09/28/2019   Procedure:  FINE NEEDLE ASPIRATION (FNA) LINEAR;  Surgeon: Meridee Score Netty Starring., MD;  Location: [facility] ENDOSCOPY;  Service: Gastroenterology;;  . SMALL INTESTINE SURGERY    . WISDOM TOOTH EXTRACTION       OB History    Gravida      Para      Term      Preterm      AB      Living  7     SAB      TAB      Ectopic      Multiple      Live Births              Family History  Problem Relation Age of Onset  .  Anxiety disorder Sister   . Restless legs syndrome Sister   . Heart failure Mother   . CAD Mother   . Heart attack Mother   . Colon cancer Neg Hx   . Esophageal cancer Neg Hx   . Inflammatory bowel disease Neg Hx   . Liver disease Neg Hx   . Pancreatic cancer Neg Hx   . Stomach cancer Neg Hx   . Rectal cancer Neg Hx     Social History   Tobacco Use  . Smoking status: Current Every Day Smoker    Packs/day: 0.50    Years: 24.00    Pack years: 12.00    Types: Cigarettes  . Smokeless tobacco: Never Used  . Tobacco comment: Cut back to 5 cig/day  Vaping Use  . Vaping Use: Former  Substance Use Topics  . Alcohol use: Yes    Comment: As of 08/05/19: on some weekends (typically 2-3 beers a sitting); Previously: ETOH abuse, No ETOH since 09/20/19  . Drug use: Yes    Types: Marijuana, Heroin, Oxycodone, Hydrocodone, Cocaine    Comment: accidental OD recently- heroin    Home Medications Prior to Admission medications   Medication Sig Start Date End Date Taking? Authorizing Provider  albuterol (VENTOLIN HFA) 108 (90 Base) MCG/ACT inhaler Inhale 1-2 puffs into the lungs every 6 (six) hours as needed for wheezing or shortness of breath. 01/23/20  Yes Long, Arlyss Repress, MD  amLODipine (NORVASC) 10 MG tablet Take 10 mg by mouth daily.   Yes [provider]  benzonatate (TESSALON) 100 MG capsule Take 1 capsule (100 mg total) by mouth every 8 (eight) hours. 01/23/20  Yes Long, Arlyss Repress, MD  melatonin 5 MG TABS Take 5 mg by mouth at bedtime as needed (sleep).   Yes [provider]  metoprolol tartrate (LOPRESSOR) 50 MG tablet Take 1.5 tablets (75 mg total) by mouth 2 (two) times daily. Patient taking differently: Take 75 mg by mouth 2 (two) times daily.  04/09/19 01/27/20 Yes Laqueta Linden, MD  nicotine (NICODERM CQ - DOSED IN MG/24 HOURS) 21 mg/24hr patch Place 21 mg onto the skin daily.   Yes [provider]  ondansetron (ZOFRAN ODT) 4 MG disintegrating tablet Take  1 tablet (4 mg total) by mouth every 8 (eight) hours as needed. 01/23/20  Yes Long, Arlyss Repress, MD  buPROPion (WELLBUTRIN XL) 300 MG 24 hr tablet Take 300 mg by mouth daily as needed (anxiety).  Patient not taking: Reported on 01/27/2020 05/19/19   [provider]  gabapentin (NEURONTIN) 100 MG capsule Take 100 mg by mouth 3 (three) times daily as needed (pain).  06/23/19   [provider]  Glecaprevir-Pibrentasvir (MAVYRET) 100-40 MG TABS Take 3 tablets by mouth daily.  Take 3 tablets by mouth daily with a meal. Patient not taking: Reported on 01/27/2020 08/06/19   Veryl Speak, FNP  ibuprofen (ADVIL) 800 MG tablet Take 1 tablet (800 mg total) by mouth every 8 (eight) hours as needed. Patient not taking: Reported on 01/27/2020 11/29/19   Elson Areas, PA-C  methocarbamol (ROBAXIN) 500 MG tablet Take 1 tablet (500 mg total) by mouth 2 (two) times daily. Patient not taking: Reported on 01/27/2020 11/26/19   Gailen Shelter, PA  omeprazole (PRILOSEC) 40 MG capsule Take 1 capsule (40 mg total) by mouth daily. Patient not taking: Reported on 01/27/2020 09/28/19 09/27/20  Mansouraty, Netty Starring., MD  sulfamethoxazole-trimethoprim (BACTRIM DS) 800-160 MG tablet Take by mouth. Patient not taking: Reported on 01/27/2020 01/18/20 01/28/20  [provider]    Allergies    Adhesive [tape]  Review of Systems   Review of Systems  Constitutional: Negative.   HENT: Negative.   Respiratory: Positive for cough and shortness of breath. Negative for apnea, choking, chest tightness, wheezing and stridor.   Cardiovascular: Negative.   Gastrointestinal: Negative.   Genitourinary: Negative.   Musculoskeletal: Negative.   Skin: Negative.   Neurological: Negative.   Psychiatric/Behavioral: The patient is nervous/anxious.   All other systems reviewed and are negative.   Physical Exam Updated Vital Signs BP 109/84   Pulse (!) 114   Temp 98 F (36.7 C) (Oral)   Resp (!) 21   Ht 4'  10" (1.473 m)   Wt 43.1 kg   SpO2 94%   BMI 19.86 kg/m   Physical Exam Vitals and nursing note reviewed.  Constitutional:      General: She is not in acute distress.    Appearance: She is well-developed. She is not ill-appearing, toxic-appearing or diaphoretic.  HENT:     Head: Normocephalic and atraumatic.     Nose: Nose normal.     Mouth/Throat:     Mouth: Mucous membranes are moist.  Eyes:     Pupils: Pupils are equal, round, and reactive to light.  Cardiovascular:     Rate and Rhythm: Tachycardia present.     Pulses: Normal pulses.     Heart sounds: Normal heart sounds.  Pulmonary:     Effort: Pulmonary effort is normal. No respiratory distress.     Breath sounds: Wheezing and rhonchi present.     Comments: Expiratory wheeze bilaterally.  Mild coarse rhonchi.  Active cough in room.  No tachypnea.  Speaks in full sentences without difficulty. Abdominal:     General: Bowel sounds are normal. There is no distension.     Palpations: There is no mass.     Tenderness: There is no abdominal tenderness. There is no right CVA tenderness, left CVA tenderness or guarding.     Hernia: No hernia is present.  Musculoskeletal:        General: No swelling, tenderness, deformity or signs of injury. Normal range of motion.     Cervical back: Normal range of motion.     Right lower leg: No edema.     Left lower leg: No edema.     Comments: Compartments soft.  No bony tenderness.  Denna Haggard' sign negative.  Skin:    General: Skin is warm and dry.     Capillary Refill: Capillary refill takes less than 2 seconds.     Comments: No edema, erythema or warmth.  No fluctuance or induration  Neurological:     General: No focal deficit present.  Mental Status: She is alert and oriented to person, place, and time.  Psychiatric:     Comments: Tremulous on exam.     ED Results / Procedures / Treatments   Labs (all labs ordered are listed, but only abnormal results are displayed) Labs Reviewed   CBC WITH DIFFERENTIAL/PLATELET - Abnormal; Notable for the following components:      Result Value   Platelets 134 (*)    All other components within normal limits  D-DIMER, QUANTITATIVE (NOT [facility]) - Abnormal; Notable for the following components:   D-Dimer, Quant 0.66 (*)    All other components within normal limits  BASIC METABOLIC PANEL  HCG, SERUM, QUALITATIVE  TROPONIN I (HIGH SENSITIVITY)    EKG None  Radiology DG Chest Portable 1 View  Result Date: 01/27/2020 CLINICAL DATA:  39 year old female with shortness of breath EXAM: PORTABLE CHEST 1 VIEW COMPARISON:  Chest radiograph dated 01/23/2020. FINDINGS: The lungs are clear. There is no pleural effusion pneumothorax. The cardiac silhouette is within limits. No acute osseous pathology. IMPRESSION: No active disease. Electronically Signed   By: Elgie CollardArash  Radparvar M.D.   On: 01/27/2020 20:16    Procedures Procedures (including critical care time)  Medications Ordered in ED Medications  ipratropium (ATROVENT HFA) inhaler 2 puff (has no administration in time range)  methylPREDNISolone sodium succinate (SOLU-MEDROL) 125 mg/2 mL injection 125 mg (125 mg Intravenous Given 01/27/20 2004)  dexamethasone (DECADRON) injection 10 mg (10 mg Intravenous Given 01/27/20 2004)  albuterol (VENTOLIN HFA) 108 (90 Base) MCG/ACT inhaler 2 puff (2 puffs Inhalation Given 01/27/20 2005)  LORazepam (ATIVAN) injection 1 mg (1 mg Intravenous Given 01/27/20 2004)   ED Course  I have reviewed the triage vital signs and the nursing notes.  Pertinent labs & imaging results that were available during my care of the patient were reviewed by me and considered in my medical decision making (see chart for details).  39 year old, known Covid diagnosis presents for evaluation of shortness of breath.  No chest pain.  She is afebrile, nonseptic, not ill-appearing.  Increase shortness of breath which began 3 hours ago.  She thought this was due to her COPD and  tried using her albuterol.  Patient states she feels anxious due to her shortness of breath and feeling like her heart is racing.  Does have prior history of EtOH use and heroin use her states she has not used recently.  No janeway lesions, osler nodes, murmur,  I have low suspicion for endocarditis. No prior history of DTs or withdrawal seizures.  No clinical evidence of DVT on exam.  Her Denna HaggardHomans' sign is negative.  Neurovascularly intact.  She does have coarse breath sounds bilaterally and some mild expiratory wheeze.  Appears tremulous on exam however does appear significantly anxious.  She speaks in full sentences but difficulty.  Plan on labs, imaging and reassess  Labs and imaging personally reviewed and interpreted:  CBC without leukocytosis D-dimer 0.66 Dg chest without acute findings EKG without ischemia  Care transferred to UnicoiGibbons, PA-C who will follow up on remaining labs and imaging.  If labs without significant findings, CTA without any acute abnormality, patient breathing back to baseline, tachycardia improved and she is able to ambulate any hypoxia likely DC home.  DC home likely recommend steroid taper for COPD exacerbation and Atrovent inhaler.    Clinical Course as of Jan 27 2039  Wed Jan 27, 2020  2031 Pulse Rate(!): 114 [CG]  2031 Resp(!): 21 [CG]  2031 SpO2: 94 % [  CG]  2031 BP: 109/84 [CG]  2031 D-Dimer, Quant(!): 0.66 [CG]    Clinical Course User Index [CG] Liberty Handy, PA-C   MDM Rules/Calculators/A&P                          Sonoma Firkus was evaluated in Emergency Department on 01/27/2020 for the symptoms described in the history of present illness. She was evaluated in the context of the global COVID-19 pandemic, which necessitated consideration that the patient might be at risk for infection with the SARS-CoV-2 virus that causes COVID-19. Institutional protocols and algorithms that pertain to the evaluation of patients at risk for COVID-19 are in a state of  rapid change based on information released by regulatory bodies including the CDC and federal and state organizations. These policies and algorithms were followed during the patient's care in the ED. Final Clinical Impression(s) / ED Diagnoses Final diagnoses:  SOB (shortness of breath)  Anxiety  COVID    Rx / DC Orders ED Discharge Orders    None       Lacretia Tindall A, PA-C 01/27/20 2040

## 2020-01-27 NOTE — ED Notes (Signed)
Contacted AC Kathy to obtain atrovent inhaler.  She states medication is not available.

## 2020-01-27 NOTE — ED Notes (Signed)
Date and time results received: 01/27/20 2050  Test: Potassium Critical Value: 2.3  Name of Provider Notified: Dr. Criss Alvine Orders Received? Or Actions Taken?: No

## 2020-01-27 NOTE — ED Notes (Signed)
Date and time results received: 01/27/20  2200  Test: Venous P02  Critical Value: 31.6  Name of Provider Notified: Margarette Asal PA Orders Received? Or Actions Taken?: No

## 2020-01-27 NOTE — ED Provider Notes (Addendum)
Sudden onset SOB CP with breathing H/o COPD heroin abuse history none recently Tachy, tachypnic  Single hypoxic reading but normal on repeat Wheezing on exam, given prednisone  Tremulous, very anxious, given ativan No fever   Pending CTA and single trop, labs  Re-eval for wheezing, hypoxia If normal will dc with prednisone, albuterol  Physical Exam  BP 109/84    Pulse (!) 114    Temp 98 F (36.7 C) (Oral)    Resp (!) 21    Ht 4\' 10"  (1.473 m)    Wt 43.1 kg    SpO2 94%    BMI 19.86 kg/m   Physical Exam Vitals and nursing note reviewed.  Constitutional:      General: She is not in acute distress.    Appearance: She is well-developed.     Comments: NAD.  HENT:     Head: Normocephalic and atraumatic.     Right Ear: External ear normal.     Left Ear: External ear normal.     Nose: Nose normal.  Eyes:     General: No scleral icterus.    Conjunctiva/sclera: Conjunctivae normal.  Cardiovascular:     Rate and Rhythm: Regular rhythm. Tachycardia present.     Heart sounds: Normal heart sounds. No murmur heard.      Comments: HR in 120s. No murmur  Pulmonary:     Effort: Pulmonary effort is normal.     Breath sounds: Normal breath sounds. No wheezing.  Musculoskeletal:        General: No deformity. Normal range of motion.     Cervical back: Normal range of motion and neck supple.  Skin:    General: Skin is warm and dry.     Capillary Refill: Capillary refill takes less than 2 seconds.  Neurological:     Mental Status: She is alert and oriented to person, place, and time.     Comments: Mild tremulousness hands and tongue. Appears slightly anxious.   Psychiatric:        Behavior: Behavior normal.        Thought Content: Thought content normal.        Judgment: Judgment normal.     ED Course/Procedures   Clinical Course as of Jan 26 2233  Wed Jan 27, 2020  2031 Pulse Rate(!): 114 [CG]  2031 Resp(!): 21 [CG]  2031 SpO2: 94 % [CG]  2031 BP: 109/84 [CG]  2031 D-Dimer,  Quant(!): 0.66 [CG]  2107 Potassium(!!): 2.3 [CG]  2107 Glucose(!): 125 [CG]  2107 Anion gap(!): >20 [CG]  2159 IMPRESSION: No acute intrathoracic pathology. No pulmonary artery embolus identified.    CT Angio Chest PE W/Cm &/Or Wo Cm [CG]  2201 pH, Ven: 7.400 [CG]  2201 pO2, Ven(!!): 31.6 [CG]  2201 pCO2, Ven(!): 37.2 [CG]    Clinical Course User Index [CG] 2202, PA-C    Procedures  MDM   2155: Evaluated patient HR 125. Normal WOB. Slight shakiness hands and tongue but states she feels better. CP and SOB has resolved.   States had sudden onset CP and SOB unknown what time can't remember. bc of SOB felt anxious, tremulous. Now CP and SOB resolved and so feeling less anxious and less shakiness.  Drinks ETOH every 2-3 days, usually a couple of 12 oz beers. Last drink last night 1 glass of wine last night. Previous history of IVDU heroin but states only 2-3 times, doesn't remember when she used last. Still having cough with phlegm since COVID  illness. No vomiting, diarrhea. Known "tachycardia" take metoprolol 50 mg BID, can't remember last time she took it.   Patient continues to be persistently tachycardic. This could be from alcohol/heroin withdrawal. Reports history of sinus tachycardia and on metoprolol. CIWA 9.   ER work-up was visualized and interpreted. Potassium 2.3, glucose 125, chloride 95, corrected anion gap 21, mild hyponatremia.   Added VBG, lactic acid, ETOH, TSH, UA, UDS.   Ordered 500 cc LR, IV K, Mg, CIWA protocol. Will hold off on metoprolol given SBP 99. Has received solumedrol, albuterol, ativan 1 mg.   2045: VBG with normal pH and overall reassuring. Lactic acid 6.9. EtOH undetectable. D-dimer was elevated and CTA was obtained that was negative. Troponin undetectable.  Discussed with EDP. Given significant laboratory abnormalities, persistent tachycardia, will request admission for telemetry monitoring. Suspect some degree of alcohol/heroin  withdrawal may be contributing. CIWA protocol.       Liberty Handy, PA-C 01/27/20 2242    Pricilla Loveless, MD 01/28/20 [phone]

## 2020-01-28 DIAGNOSIS — R0602 Shortness of breath: Secondary | ICD-10-CM

## 2020-01-28 DIAGNOSIS — E872 Acidosis: Secondary | ICD-10-CM

## 2020-01-28 DIAGNOSIS — U071 COVID-19: Secondary | ICD-10-CM | POA: Insufficient documentation

## 2020-01-28 LAB — RAPID URINE DRUG SCREEN, HOSP PERFORMED
Amphetamines: NOT DETECTED
Barbiturates: NOT DETECTED
Benzodiazepines: NOT DETECTED
Cocaine: NOT DETECTED
Opiates: NOT DETECTED
Tetrahydrocannabinol: NOT DETECTED

## 2020-01-28 LAB — URINALYSIS, ROUTINE W REFLEX MICROSCOPIC
Bilirubin Urine: NEGATIVE
Glucose, UA: NEGATIVE mg/dL
Hgb urine dipstick: NEGATIVE
Ketones, ur: NEGATIVE mg/dL
Leukocytes,Ua: NEGATIVE
Nitrite: NEGATIVE
Protein, ur: NEGATIVE mg/dL
Specific Gravity, Urine: 1.032 — ABNORMAL HIGH (ref 1.005–1.030)
pH: 5 (ref 5.0–8.0)

## 2020-01-28 LAB — COMPREHENSIVE METABOLIC PANEL
ALT: 26 U/L (ref 0–44)
AST: 78 U/L — ABNORMAL HIGH (ref 15–41)
Albumin: 3.1 g/dL — ABNORMAL LOW (ref 3.5–5.0)
Alkaline Phosphatase: 63 U/L (ref 38–126)
Anion gap: 9 (ref 5–15)
BUN: 5 mg/dL — ABNORMAL LOW (ref 6–20)
CO2: 19 mmol/L — ABNORMAL LOW (ref 22–32)
Calcium: 6.8 mg/dL — ABNORMAL LOW (ref 8.9–10.3)
Chloride: 107 mmol/L (ref 98–111)
Creatinine, Ser: 0.6 mg/dL (ref 0.44–1.00)
GFR, Estimated: >60 mL/min (ref >60)
Glucose, Bld: 233 mg/dL — ABNORMAL HIGH (ref 70–99)
Potassium: 4.5 mmol/L (ref 3.5–5.1)
Sodium: 135 mmol/L (ref 135–145)
Total Bilirubin: 0.7 mg/dL (ref 0.3–1.2)
Total Protein: 5.7 g/dL — ABNORMAL LOW (ref 6.5–8.1)

## 2020-01-28 LAB — CBC
HCT: 31.5 % — ABNORMAL LOW (ref 36.0–46.0)
Hemoglobin: 10.6 g/dL — ABNORMAL LOW (ref 12.0–15.0)
MCH: 31.6 pg (ref 26.0–34.0)
MCHC: 33.7 g/dL (ref 30.0–36.0)
MCV: 94 fL (ref 80.0–100.0)
Platelets: 115 10*3/uL — ABNORMAL LOW (ref 150–400)
RBC: 3.35 MIL/uL — ABNORMAL LOW (ref 3.87–5.11)
RDW: 15.9 % — ABNORMAL HIGH (ref 11.5–15.5)
WBC: 2.9 10*3/uL — ABNORMAL LOW (ref 4.0–10.5)
nRBC: 0 % (ref 0.0–0.2)

## 2020-01-28 LAB — MAGNESIUM: Magnesium: 1.5 mg/dL — ABNORMAL LOW (ref 1.7–2.4)

## 2020-01-28 LAB — PROCALCITONIN: Procalcitonin: 0.57 ng/mL

## 2020-01-28 LAB — LACTIC ACID, PLASMA
Lactic Acid, Venous: 5 mmol/L (ref 0.5–1.9)
Lactic Acid, Venous: 1.7 mmol/L (ref 0.5–1.9)
Lactic Acid, Venous: 2.9 mmol/L (ref 0.5–1.9)
Lactic Acid, Venous: 3.8 mmol/L (ref 0.5–1.9)

## 2020-01-28 LAB — PHOSPHORUS: Phosphorus: <1 mg/dL — CL (ref 2.5–4.6)

## 2020-01-28 MED ORDER — GLECAPREVIR-PIBRENTASVIR 100-40 MG PO TABS: 3.0000 | ORAL_TABLET | Freq: Every day | ORAL | Status: DC

## 2020-01-28 MED ORDER — LORAZEPAM 2 MG/ML IJ SOLN
1.0000 mg | INTRAMUSCULAR | Status: DC | PRN
  Administered 2020-01-28: 1 mg via INTRAVENOUS

## 2020-01-28 MED ORDER — ONDANSETRON HCL 4 MG PO TABS: 4.0000 mg | ORAL_TABLET | Freq: Four times a day (QID) | ORAL | Status: DC | PRN

## 2020-01-28 MED ORDER — PANTOPRAZOLE SODIUM 40 MG PO TBEC
40.0000 mg | DELAYED_RELEASE_TABLET | Freq: Every day | ORAL | Status: DC
  Administered 2020-01-28: 40 mg via ORAL
  Filled 2020-01-28: qty 1

## 2020-01-28 MED ORDER — POTASSIUM CHLORIDE 20 MEQ PO PACK
40.0000 meq | PACK | Freq: Once | ORAL | Status: AC
  Administered 2020-01-28: 40 meq via ORAL
  Filled 2020-01-28: qty 2

## 2020-01-28 MED ORDER — SODIUM PHOSPHATES 45 MMOLE/15ML IV SOLN
30.0000 mmol | Freq: Once | INTRAVENOUS | Status: AC
  Administered 2020-01-28: 30 mmol via INTRAVENOUS
  Filled 2020-01-28: qty 10

## 2020-01-28 MED ORDER — NICOTINE 21 MG/24HR TD PT24
21.0000 mg | MEDICATED_PATCH | Freq: Every day | TRANSDERMAL | Status: DC
  Administered 2020-01-28: 21 mg via TRANSDERMAL
  Filled 2020-01-28: qty 1

## 2020-01-28 MED ORDER — POTASSIUM PHOSPHATES 15 MMOLE/5ML IV SOLN: 30.0000 mmol | Freq: Once | INTRAVENOUS | Status: DC

## 2020-01-28 MED ORDER — THIAMINE HCL 100 MG/ML IJ SOLN
100.0000 mg | Freq: Every day | INTRAMUSCULAR | Status: DC
  Administered 2020-01-28: 100 mg via INTRAVENOUS
  Filled 2020-01-28: qty 2

## 2020-01-28 MED ORDER — LORAZEPAM 2 MG/ML IJ SOLN: 0.0000 mg | Freq: Two times a day (BID) | INTRAMUSCULAR | Status: DC

## 2020-01-28 MED ORDER — THIAMINE HCL 100 MG PO TABS: 100.0000 mg | ORAL_TABLET | Freq: Every day | ORAL | Status: DC

## 2020-01-28 MED ORDER — ACETAMINOPHEN 650 MG RE SUPP: 650.0000 mg | Freq: Four times a day (QID) | RECTAL | Status: DC | PRN

## 2020-01-28 MED ORDER — MAGNESIUM SULFATE 4 GM/100ML IV SOLN
4.0000 g | Freq: Once | INTRAVENOUS | Status: AC
  Administered 2020-01-28: 4 g via INTRAVENOUS
  Filled 2020-01-28: qty 100

## 2020-01-28 MED ORDER — ACETAMINOPHEN 325 MG PO TABS: 650.0000 mg | ORAL_TABLET | Freq: Four times a day (QID) | ORAL | Status: DC | PRN

## 2020-01-28 MED ORDER — BUPROPION HCL ER (XL) 150 MG PO TB24: 300.0000 mg | ORAL_TABLET | Freq: Every day | ORAL | Status: DC | PRN

## 2020-01-28 MED ORDER — POLYETHYLENE GLYCOL 3350 17 G PO PACK: 17.0000 g | PACK | Freq: Every day | ORAL | Status: DC | PRN

## 2020-01-28 MED ORDER — ADULT MULTIVITAMIN W/MINERALS CH
1.0000 | ORAL_TABLET | Freq: Every day | ORAL | Status: DC
  Administered 2020-01-28: 1 via ORAL
  Filled 2020-01-28: qty 1

## 2020-01-28 MED ORDER — ALBUTEROL SULFATE HFA 108 (90 BASE) MCG/ACT IN AERS: 1.0000 | INHALATION_SPRAY | Freq: Four times a day (QID) | RESPIRATORY_TRACT | Status: DC | PRN

## 2020-01-28 MED ORDER — AMLODIPINE BESYLATE 5 MG PO TABS
10.0000 mg | ORAL_TABLET | Freq: Every day | ORAL | Status: DC
  Administered 2020-01-28: 10 mg via ORAL
  Filled 2020-01-28: qty 2

## 2020-01-28 MED ORDER — LORAZEPAM 2 MG/ML IJ SOLN
0.0000 mg | Freq: Four times a day (QID) | INTRAMUSCULAR | Status: DC
  Filled 2020-01-28: qty 1

## 2020-01-28 MED ORDER — LORAZEPAM 1 MG PO TABS: 1.0000 mg | ORAL_TABLET | ORAL | Status: DC | PRN

## 2020-01-28 MED ORDER — ONDANSETRON HCL 4 MG/2ML IJ SOLN: 4.0000 mg | Freq: Four times a day (QID) | INTRAMUSCULAR | Status: DC | PRN

## 2020-01-28 MED ORDER — FOLIC ACID 1 MG PO TABS
1.0000 mg | ORAL_TABLET | Freq: Every day | ORAL | Status: DC
  Administered 2020-01-28: 1 mg via ORAL
  Filled 2020-01-28: qty 1

## 2020-01-28 MED ORDER — POTASSIUM CHLORIDE 10 MEQ/100ML IV SOLN
10.0000 meq | INTRAVENOUS | Status: AC
  Administered 2020-01-28 (×3): 10 meq via INTRAVENOUS
  Filled 2020-01-28 (×3): qty 100

## 2020-01-28 MED ORDER — MELATONIN 3 MG PO TABS
5.0000 mg | ORAL_TABLET | Freq: Every evening | ORAL | Status: DC | PRN
  Filled 2020-01-28: qty 1

## 2020-01-28 MED ORDER — HEPARIN SODIUM (PORCINE) 5000 UNIT/ML IJ SOLN
5000.0000 [IU] | Freq: Three times a day (TID) | INTRAMUSCULAR | Status: DC
  Administered 2020-01-28 (×2): 5000 [IU] via SUBCUTANEOUS
  Filled 2020-01-28 (×2): qty 1

## 2020-01-28 MED ORDER — CALCIUM GLUCONATE-NACL 1-0.675 GM/50ML-% IV SOLN
1.0000 g | Freq: Once | INTRAVENOUS | Status: AC
  Administered 2020-01-28: 1000 mg via INTRAVENOUS
  Filled 2020-01-28: qty 50

## 2020-01-28 MED ORDER — SODIUM CHLORIDE 0.9 % IV BOLUS
1000.0000 mL | Freq: Once | INTRAVENOUS | Status: AC
  Administered 2020-01-28: 1000 mL via INTRAVENOUS

## 2020-01-28 MED ORDER — SODIUM CHLORIDE 0.9 % IV BOLUS: 1000.0000 mL | Freq: Once | INTRAVENOUS | Status: DC

## 2020-01-28 MED ORDER — METOPROLOL TARTRATE 50 MG PO TABS
75.0000 mg | ORAL_TABLET | Freq: Two times a day (BID) | ORAL | Status: DC
  Administered 2020-01-28: 75 mg via ORAL
  Filled 2020-01-28: qty 1

## 2020-01-28 MED ORDER — OXYCODONE HCL 5 MG PO TABS: 5.0000 mg | ORAL_TABLET | ORAL | Status: DC | PRN

## 2020-01-28 NOTE — H&P (Addendum)
TRH H&P    Patient Demographics:    Hannah Rhodes, is a 39 y.o. female  MRN: [id number]  DOB - 10/11/1980  Admit Date - 01/27/2020  Referring MD/NP/PA: Debarah Crapelaudia  Outpatient Primary MD for the patient is Suzan Slickucker, Alethea Y, MD  Patient coming from: Home  Chief complaint-dyspnea   HPI:    Hannah Rhodes  is a 39 y.o. female, with history of tachycardia, tobacco dependence, panic attacks/anxiety, hypertension, heroin use, GERD, fatty liver, degenerative disc disease, COPD, chronic hepatitis C, alcoholism, and more presents to the ED with a chief complaint of shortness of breath.  It was sudden in onset at 5 PM on 01/27/2020.  Patient reports she was washing her daughter's hair.  She reports that shortness of breath was worse with exertion, better with rest.  She attempted her inhalers at home and they offered some improvement, but did not resolve her shortness of breath completely.  Patient reports that she had palpitations, and felt lightheaded.  This is very similar to when she had a panic attack 1 month ago per her report.  She denies any chest pain.  She reports that her cough is eased off in the past several days (after COVID-19).  Patient reports that she did not take her beta-blocker today.  She does report that she is otherwise adherent to her medical regimen. She smokes 0.5ppd. Patient drinks alcohol regularly but is guarded about her answers.  She reports she does not know when the last time she used heroin was.  Patient reports that she has not been vaccinated for Covid.  She is full code.  In the ED Temperature 98, heart rate 111, respiratory rate 19-34, O2 sats 86 to 96%-ER provider reports that the 86% was not accurate White blood cell count 6.0, hemoglobin 14.0 Chemistry panel reveals a hypokalemia of 2.3, and anion gap of greater than 20 D-dimer 0.66 CTA showed no acute intrathoracic pathology Alcohol level  less than 10 Chest x-ray shows no active disease Albuterol 2 puffs, 6 puffs given in ED 20 mEq of potassium given in ED Decadron 10 mg, Solu-Medrol 125 mg LR 500 bolus Ativan 1 mg Mag 2 g    Review of systems:    In addition to the HPI above,  Review of Systems  Constitutional: Negative for chills, fever and malaise/fatigue.  HENT: Negative for congestion, ear discharge, ear pain, sinus pain and sore throat.   Eyes: Negative for pain and discharge.  Respiratory: Positive for cough and shortness of breath. Negative for sputum production.   Cardiovascular: Positive for palpitations. Negative for chest pain and leg swelling.  Gastrointestinal: Negative for abdominal pain, diarrhea, nausea and vomiting.  Genitourinary: Negative for dysuria, frequency and urgency.  Musculoskeletal: Negative for myalgias.  Skin: Negative for rash.  Neurological: Negative for dizziness and weakness.  Psychiatric/Behavioral: Positive for substance abuse. The patient is nervous/anxious.      All other systems reviewed and are negative.    Past History of the following :    Past Medical History:  Diagnosis Date  Alcoholism (HCC)    Anxiety    Chronic hepatitis C (HCC)    COPD (chronic obstructive pulmonary disease) (HCC)    no inaler   DDD (degenerative disc disease), lumbar    Fatty liver    Generalized headaches    GERD (gastroesophageal reflux disease)    diet controlled, no med   Heroin use    HPV (human papilloma virus) infection    Hypertension    Insomnia    Panic attack    Pneumonia 03/2010   Smoker    Tachycardia       Past Surgical History:  Procedure Laterality Date   BIOPSY  09/28/2019   Procedure: BIOPSY;  Surgeon: Lemar Lofty., MD;  Location: [facility] ENDOSCOPY;  Service: Gastroenterology;;   CESAREAN SECTION     CESAREAN SECTION     ESOPHAGOGASTRODUODENOSCOPY (EGD) WITH PROPOFOL N/A 09/28/2019   Procedure: ESOPHAGOGASTRODUODENOSCOPY (EGD)  WITH PROPOFOL;  Surgeon: Lemar Lofty., MD;  Location: [facility] ENDOSCOPY;  Service: Gastroenterology;  Laterality: N/A;   EUS  09/28/2019   Procedure: UPPER ENDOSCOPIC ULTRASOUND (EUS) LINEAR;  Surgeon: Lemar Lofty., MD;  Location: [facility] ENDOSCOPY;  Service: Gastroenterology;;   EUS  09/28/2019   Procedure: FULL UPPER ENDOSCOPIC ULTRASOUND (EUS) RADIAL;  Surgeon: Lemar Lofty., MD;  Location: [facility] ENDOSCOPY;  Service: Gastroenterology;;   FINE NEEDLE ASPIRATION  09/28/2019   Procedure: FINE NEEDLE ASPIRATION (FNA) LINEAR;  Surgeon: Lemar Lofty., MD;  Location: [facility] ENDOSCOPY;  Service: Gastroenterology;;   SMALL INTESTINE SURGERY     WISDOM TOOTH EXTRACTION        Social History:      Social History   Tobacco Use   Smoking status: Current Every Day Smoker    Packs/day: 0.50    Years: 24.00    Pack years: 12.00    Types: Cigarettes   Smokeless tobacco: Never Used   Tobacco comment: Cut back to 5 cig/day  Substance Use Topics   Alcohol use: Yes    Comment: As of 08/05/19: on some weekends (typically 2-3 beers a sitting); Previously: ETOH abuse, No ETOH since 09/20/19       Family History :     Family History  Problem Relation Age of Onset   Anxiety disorder Sister    Restless legs syndrome Sister    Heart failure Mother    CAD Mother    Heart attack Mother    Colon cancer Neg Hx    Esophageal cancer Neg Hx    Inflammatory bowel disease Neg Hx    Liver disease Neg Hx    Pancreatic cancer Neg Hx    Stomach cancer Neg Hx    Rectal cancer Neg Hx       Home Medications:   Prior to Admission medications   Medication Sig Start Date End Date Taking? Authorizing Provider  albuterol (VENTOLIN HFA) 108 (90 Base) MCG/ACT inhaler Inhale 1-2 puffs into the lungs every 6 (six) hours as needed for wheezing or shortness of breath. 01/23/20  Yes Long, Arlyss Repress, MD  amLODipine (NORVASC) 10 MG tablet Take 10 mg by mouth daily.   Yes  [provider]  benzonatate (TESSALON) 100 MG capsule Take 1 capsule (100 mg total) by mouth every 8 (eight) hours. 01/23/20  Yes Long, Arlyss Repress, MD  melatonin 5 MG TABS Take 5 mg by mouth at bedtime as needed (sleep).   Yes [provider]  metoprolol tartrate (LOPRESSOR) 50 MG tablet Take 1.5 tablets (75 mg total) by  mouth 2 (two) times daily. Patient taking differently: Take 75 mg by mouth 2 (two) times daily.  04/09/19 01/27/20 Yes Laqueta Linden, MD  nicotine (NICODERM CQ - DOSED IN MG/24 HOURS) 21 mg/24hr patch Place 21 mg onto the skin daily.   Yes [provider]  ondansetron (ZOFRAN ODT) 4 MG disintegrating tablet Take 1 tablet (4 mg total) by mouth every 8 (eight) hours as needed. 01/23/20  Yes Long, Arlyss Repress, MD  buPROPion (WELLBUTRIN XL) 300 MG 24 hr tablet Take 300 mg by mouth daily as needed (anxiety).  Patient not taking: Reported on 01/27/2020 05/19/19   [provider]  gabapentin (NEURONTIN) 100 MG capsule Take 100 mg by mouth 3 (three) times daily as needed (pain).  06/23/19   [provider]  Glecaprevir-Pibrentasvir (MAVYRET) 100-40 MG TABS Take 3 tablets by mouth daily. Take 3 tablets by mouth daily with a meal. Patient not taking: Reported on 01/27/2020 08/06/19   Veryl Speak, FNP  ibuprofen (ADVIL) 800 MG tablet Take 1 tablet (800 mg total) by mouth every 8 (eight) hours as needed. Patient not taking: Reported on 01/27/2020 11/29/19   Elson Areas, PA-C  methocarbamol (ROBAXIN) 500 MG tablet Take 1 tablet (500 mg total) by mouth 2 (two) times daily. Patient not taking: Reported on 01/27/2020 11/26/19   Gailen Shelter, PA  omeprazole (PRILOSEC) 40 MG capsule Take 1 capsule (40 mg total) by mouth daily. Patient not taking: Reported on 01/27/2020 09/28/19 09/27/20  Mansouraty, Netty Starring., MD  sulfamethoxazole-trimethoprim (BACTRIM DS) 800-160 MG tablet Take by mouth. Patient not taking: Reported on 01/27/2020 01/18/20 01/28/20   [provider]     Allergies:     Allergies  Allergen Reactions   Adhesive [Tape] Rash    Tears skin Ok with paper tape     Physical Exam:   Vitals  Blood pressure 118/78, pulse (!) 105, temperature 98 F (36.7 C), temperature source Oral, resp. rate 18, height  (1.473 m), weight 43.1 kg, SpO2 96 %.  1.  General: Patient lying in bed in no acute distress  2. Psychiatric: Cooperative with exam, alert and oriented x3, behavior normal for situation  3. Neurologic: Cranial nerves II through XII intact, no focal deficit on limited exam, moves all 4 extremities voluntarily  4. HEENMT:  Head is atraumatic, normocephalic, pupils are reactive to light, neck is supple, very poor dentition, mucous membranes are moist  5. Respiratory : Lungs are clear to auscultation bilaterally  6. Cardiovascular : Heart rate is tachycardic, rhythm is regular, no murmurs rubs or gallops  7. Gastrointestinal:  Abdomen is soft, nondistended, nontender to palpation  8. Skin:  No acute lesions on limited skin exam  9.Musculoskeletal:  No acute deformity or peripheral edema    Data Review:    CBC Recent Labs  Lab 01/23/20 1255 01/27/20 1947  WBC 3.6* 6.0  HGB 15.2* 14.0  HCT 43.1 40.9  PLT 210 134*  MCV 92.1 92.5  MCH 32.5 31.7  MCHC 35.3 34.2  RDW 16.1* 15.4  LYMPHSABS 2.4 3.4  MONOABS 0.3 0.4  EOSABS 0.0 0.1  BASOSABS 0.0 0.0   ------------------------------------------------------------------------------------------------------------------  Results for orders placed or performed during the hospital encounter of 01/27/20 (from the past 48 hour(s))  CBC with Differential     Status: Abnormal   Collection Time: 01/27/20  7:47 PM  Result Value Ref Range   WBC 6.0 4.0 - 10.5 K/uL   RBC 4.42 3.87 - 5.11 MIL/uL  Hemoglobin 14.0 12.0 - 15.0 g/dL   HCT 16.1 36 - 46 %   MCV 92.5 80.0 - 100.0 fL   MCH 31.7 26.0 - 34.0 pg   MCHC 34.2 30.0 - 36.0 g/dL   RDW  09.6 04.5 - 40.9 %   Platelets 134 (L) 150 - 400 K/uL   nRBC 0.0 0.0 - 0.2 %   Neutrophils Relative % 34 %   Neutro Abs 2.0 1.7 - 7.7 K/uL   Lymphocytes Relative 57 %   Lymphs Abs 3.4 0.7 - 4.0 K/uL   Monocytes Relative 7 %   Monocytes Absolute 0.4 0.1 - 1.0 K/uL   Eosinophils Relative 1 %   Eosinophils Absolute 0.1 0.0 - 0.5 K/uL   Basophils Relative 1 %   Basophils Absolute 0.0 0.0 - 0.1 K/uL   Immature Granulocytes 0 %   Abs Immature Granulocytes 0.01 0.00 - 0.07 K/uL    Comment: Performed at [facility], [address], [place], [place] [address]  Basic metabolic panel     Status: Abnormal   Collection Time: 01/27/20  7:47 PM  Result Value Ref Range   Sodium 134 (L) 135 - 145 mmol/L   Potassium 2.3 (LL) 3.5 - 5.1 mmol/L    Comment: CRITICAL RESULT CALLED TO, READ BACK BY AND VERIFIED WITH: POWELL,A ON 01/27/20 AT 2045 BY LOY,C    Chloride 95 (L) 98 - 111 mmol/L   CO2 18 (L) 22 - 32 mmol/L   Glucose, Bld 125 (H) 70 - 99 mg/dL    Comment: Glucose reference range applies only to samples taken after fasting for at least 8 hours.   BUN 8 6 - 20 mg/dL   Creatinine, Ser 4.78 0.44 - 1.00 mg/dL   Calcium 8.7 (L) 8.9 - 10.3 mg/dL   GFR, Estimated >29 >56 mL/min    Comment: (NOTE) Calculated using the CKD-EPI Creatinine Equation (2021)    Anion gap >20 (H) 5 - 15    Comment: Performed at [facility], [address] [place], [place] [address]  Troponin I (High Sensitivity)     Status: None   Collection Time: 01/27/20  7:47 PM  Result Value Ref Range   Troponin I (High Sensitivity) 3 <18 ng/L    Comment: (NOTE) Elevated high sensitivity troponin I (hsTnI) values and significant  changes across serial measurements may suggest ACS but many other  chronic and acute conditions are known to elevate hsTnI results.  Refer to the Links section for chest pain algorithms and additional  guidance. Performed at [facility], [address] [place], [place] [address]   D-dimer,  quantitative (not at [facility])     Status: Abnormal   Collection Time: 01/27/20  7:47 PM  Result Value Ref Range   D-Dimer, Quant 0.66 (H) 0.00 - 0.50 ug/mL-FEU    Comment: (NOTE) At the manufacturer cut-off value of 0.5 g/mL FEU, this assay has a negative predictive value of 95-100%.This assay is intended for use in conjunction with a clinical pretest probability (PTP) assessment model to exclude pulmonary embolism (PE) and deep venous thrombosis (DVT) in outpatients suspected of PE or DVT. Results should be correlated with clinical presentation. Performed at [facility], [address] [place], [place] [address]   hCG, serum, qualitative     Status: None   Collection Time: 01/27/20  7:47 PM  Result Value Ref Range   Preg, Serum NEGATIVE NEGATIVE    Comment:        THE SENSITIVITY OF THIS  METHODOLOGY IS >10 mIU/mL. Performed at [facility], [address], [place], [place] [address]   Hepatic function panel     Status: Abnormal   Collection Time: 01/27/20  7:47 PM  Result Value Ref Range   Total Protein 7.7 6.5 - 8.1 g/dL   Albumin 4.1 3.5 - 5.0 g/dL   AST 54 (H) 15 - 41 U/L   ALT 26 0 - 44 U/L   Alkaline Phosphatase 80 38 - 126 U/L   Total Bilirubin 0.5 0.3 - 1.2 mg/dL   Bilirubin, Direct 0.1 0.0 - 0.2 mg/dL   Indirect Bilirubin 0.4 0.3 - 0.9 mg/dL    Comment: Performed at [facility], [address], [place], [place] [address]  Lipase, blood     Status: None   Collection Time: 01/27/20  7:47 PM  Result Value Ref Range   Lipase 29 11 - 51 U/L    Comment: Performed at [facility], [address], [place], [place] [address]  Magnesium     Status: Abnormal   Collection Time: 01/27/20  7:47 PM  Result Value Ref Range   Magnesium 1.2 (L) 1.7 - 2.4 mg/dL    Comment: Performed at [facility], [address], [place], [place] [address]  Troponin I (High Sensitivity)     Status: None   Collection Time: 01/27/20  9:39 PM  Result Value Ref Range   Troponin I (High Sensitivity) 3  <18 ng/L    Comment: (NOTE) Elevated high sensitivity troponin I (hsTnI) values and significant  changes across serial measurements may suggest ACS but many other  chronic and acute conditions are known to elevate hsTnI results.  Refer to the "Links" section for chest pain algorithms and additional  guidance. Performed at [facility], [address] [place], [place] [address]   Ethanol     Status: None   Collection Time: 01/27/20  9:39 PM  Result Value Ref Range   Alcohol, Ethyl (B) <10 <10 mg/dL    Comment: (NOTE) Lowest detectable limit for serum alcohol is 10 mg/dL.  For medical purposes only. Performed at [facility], [address] [place], [place] [address]   Lactic acid, plasma     Status: Abnormal   Collection Time: 01/27/20  9:39 PM  Result Value Ref Range   Lactic Acid, Venous 6.9 (HH) 0.5 - 1.9 mmol/L    Comment: CRITICAL RESULT CALLED TO, READ BACK BY AND VERIFIED WITH: POWELL,A ON 01/27/20 AT 2220 BY LOY,C Performed at [facility], [address] [place], [place] [address]   Blood gas, venous (at [facility] and [facility], not at [facility])     Status: Abnormal   Collection Time: 01/27/20  9:39 PM  Result Value Ref Range   FIO2 95.00    pH, Ven 7.400 7.25 - 7.43   pCO2, Ven 37.2 (L) 44 - 60 mmHg   pO2, Ven 31.6 (LL) 32 - 45 mmHg    Comment: CRITICAL RESULT CALLED TO, READ BACK BY AND VERIFIED WITH: POWELL,A @ 2159 ON 01/27/20 BY JUW    Bicarbonate 22.4 20.0 - 28.0 mmol/L   Acid-base deficit 1.6 0.0 - 2.0 mmol/L   O2 Saturation 54.7 %   Patient temperature 37.0     Comment: Performed at [facility], [address], [place], [place] [address]  TSH     Status: None   Collection Time: 01/27/20  9:39 PM  Result Value Ref Range   TSH 1.991 0.350 - 4.500 uIU/mL    Comment: Performed  by a 3rd Generation assay with a functional sensitivity of <=0.01 uIU/mL. Performed at [facility], [address], [place], [place] [address]     Chemistries  Recent Labs  Lab 01/23/20 1255  01/27/20 1947  NA 136 134*  K 4.3 2.3*  CL 98 95*  CO2 24 18*  GLUCOSE 107* 125*  BUN 15 8  CREATININE 0.79 0.75  CALCIUM 10.2 8.7*  MG  --  1.2*  AST 81* 54*  ALT 37 26  ALKPHOS 102 80  BILITOT 0.3 0.5   ------------------------------------------------------------------------------------------------------------------  ------------------------------------------------------------------------------------------------------------------ GFR: Estimated Creatinine Clearance: 60.4 mL/min (by C-G formula based on SCr of 0.75 mg/dL). Liver Function Tests: Recent Labs  Lab 01/23/20 1255 01/27/20 1947  AST 81* 54*  ALT 37 26  ALKPHOS 102 80  BILITOT 0.3 0.5  PROT 7.9 7.7  ALBUMIN 4.3 4.1   Recent Labs  Lab 01/27/20 1947  LIPASE 29   No results for input(s): AMMONIA in the last 168 hours. Coagulation Profile: No results for input(s): INR, PROTIME in the last 168 hours. Cardiac Enzymes: No results for input(s): CKTOTAL, CKMB, CKMBINDEX, TROPONINI in the last 168 hours. BNP (last 3 results) No results for input(s): PROBNP in the last 8760 hours. HbA1C: No results for input(s): HGBA1C in the last 72 hours. CBG: No results for input(s): GLUCAP in the last 168 hours. Lipid Profile: No results for input(s): CHOL, HDL, LDLCALC, TRIG, CHOLHDL, LDLDIRECT in the last 72 hours. Thyroid Function Tests: Recent Labs    01/27/20 2139  TSH 1.991   Anemia Panel: No results for input(s): VITAMINB12, FOLATE, FERRITIN, TIBC, IRON, RETICCTPCT in the last 72 hours.  --------------------------------------------------------------------------------------------------------------- Urine analysis:    Component Value Date/Time   COLORURINE AMBER (A) 08/26/2018 1721   APPEARANCEUR CLEAR 08/26/2018 1721   LABSPEC 1.026 08/26/2018 1721   PHURINE 5.0 08/26/2018 1721   GLUCOSEU NEGATIVE 08/26/2018 1721   HGBUR MODERATE (A) 08/26/2018 1721   BILIRUBINUR MODERATE (A) 08/26/2018 1721   KETONESUR  NEGATIVE 08/26/2018 1721   PROTEINUR NEGATIVE 08/26/2018 1721   NITRITE NEGATIVE 08/26/2018 1721   LEUKOCYTESUR SMALL (A) 08/26/2018 1721      Imaging Results:    CT Angio Chest PE W/Cm &/Or Wo Cm  Result Date: 01/27/2020 CLINICAL DATA:  39 year old female with positive D-dimer. Concern for pulmonary embolism. EXAM: CT ANGIOGRAPHY CHEST WITH CONTRAST TECHNIQUE: Multidetector CT imaging of the chest was performed using the standard protocol during bolus administration of intravenous contrast. Multiplanar CT image reconstructions and MIPs were obtained to evaluate the vascular anatomy. CONTRAST:  OMNIPAQUE IOHEXOL 350 MG/ML SOLN COMPARISON:  Chest radiograph dated 01/27/2020. FINDINGS: Cardiovascular: There is no cardiomegaly or pericardial effusion. Mild atherosclerotic calcification of the thoracic aorta. No aneurysmal dilatation or dissection. The origins of the great vessels of the aortic arch appear patent. Evaluation of the pulmonary arteries is somewhat limited due to respiratory motion artifact. No pulmonary artery embolus identified. Mediastinum/Nodes: There is no hilar or mediastinal adenopathy. The esophagus is grossly unremarkable. No mediastinal fluid collection. Lungs/Pleura: The lungs are clear. There is no pleural effusion pneumothorax. The central airways are patent. Upper Abdomen: No acute abnormality. Musculoskeletal: No chest wall abnormality. No acute or significant osseous findings. Review of the MIP images confirms the above findings. IMPRESSION: No acute intrathoracic pathology. No pulmonary artery embolus identified. Electronically Signed   By: Elgie Collard M.D.   On: 01/27/2020 21:37   DG Chest Portable 1 View  Result Date: 01/27/2020 CLINICAL DATA:  39 year old female with  shortness of breath EXAM: PORTABLE CHEST 1 VIEW COMPARISON:  Chest radiograph dated 01/23/2020. FINDINGS: The lungs are clear. There is no pleural effusion pneumothorax. The cardiac silhouette is  within limits. No acute osseous pathology. IMPRESSION: No active disease. Electronically Signed   By: Elgie Collard M.D.   On: 01/27/2020 20:16    My personal review of EKG: Rhythm sinus tach, Rate 119 /min, QTc 500 ,no Acute ST changes   Assessment & Plan:    Active Problems:   Lactic acidosis   1. Lactic acidosis 1. Lactic acid of 6.9 2. No infectious symptoms 3. Patient is not dehydrated or hypoxic 4. Thiamine deficiency, alcoholism, could be contributing 5. Continue with fluids 6. Recheck at 2-hour interval 2. Hypokalemia 1. Potassium 2.3 2. 20 mEq given in ED 3. 40 mEq p.o., 30 mEq IV runs 3. Tachycardia 1. No history of tachycardia 2. Withdrawals could also be contributing 3. Restart patient's home med metoprolol 4. Monitor on telemetry 4. Polysubstance abuse 1. UDS pending 2. History of alcoholism, heroin use, nicotine dependence 3. CIWA protocol 4. Continue to monitor 5. COVID-19 1. Tested positive on November 2 2. Currently asymptomatic per her report -attributes her shortness of breath to anxiety 3. Continue with Covid precautions 4. Continue to monitor 6. Prolonged QT 1. QTC 500 2. Monitor on telemetry   DVT Prophylaxis-   Heparin- SCDs   AM Labs Ordered, also please review Full Orders  Family Communication: No family at bedside  Code Status:  Full  Admission status: Observation  Time spent in minutes : 64   Jaylissa Felty B Zierle-Ghosh DO

## 2020-01-28 NOTE — Progress Notes (Addendum)
ASSUMPTION OF CARE NOTE   01/28/2020 12:46 PM  Hannah Rhodes was seen and examined.  The H&P by the admitting provider, orders, imaging was reviewed.  Please see new orders.  Will continue to follow.   Pt has multiple electrolyte derangements that I am working on correcting. SW asked to speak with patient regarding SA issues.    Vitals:   01/28/20 1227 01/28/20 1233  BP: 125/90 132/83  Pulse: 80 82  Resp: 20   Temp:    SpO2: 95%     Results for orders placed or performed during the hospital encounter of 01/27/20  CBC with Differential  Result Value Ref Range   WBC 6.0 4.0 - 10.5 K/uL   RBC 4.42 3.87 - 5.11 MIL/uL   Hemoglobin 14.0 12.0 - 15.0 g/dL   HCT 02.6 36 - 46 %   MCV 92.5 80.0 - 100.0 fL   MCH 31.7 26.0 - 34.0 pg   MCHC 34.2 30.0 - 36.0 g/dL   RDW 37.8 58.8 - 50.2 %   Platelets 134 (L) 150 - 400 K/uL   nRBC 0.0 0.0 - 0.2 %   Neutrophils Relative % 34 %   Neutro Abs 2.0 1.7 - 7.7 K/uL   Lymphocytes Relative 57 %   Lymphs Abs 3.4 0.7 - 4.0 K/uL   Monocytes Relative 7 %   Monocytes Absolute 0.4 0.1 - 1.0 K/uL   Eosinophils Relative 1 %   Eosinophils Absolute 0.1 0.0 - 0.5 K/uL   Basophils Relative 1 %   Basophils Absolute 0.0 0.0 - 0.1 K/uL   Immature Granulocytes 0 %   Abs Immature Granulocytes 0.01 0.00 - 0.07 K/uL  Basic metabolic panel  Result Value Ref Range   Sodium 134 (L) 135 - 145 mmol/L   Potassium 2.3 (LL) 3.5 - 5.1 mmol/L   Chloride 95 (L) 98 - 111 mmol/L   CO2 18 (L) 22 - 32 mmol/L   Glucose, Bld 125 (H) 70 - 99 mg/dL   BUN 8 6 - 20 mg/dL   Creatinine, Ser 7.74 0.44 - 1.00 mg/dL   Calcium 8.7 (L) 8.9 - 10.3 mg/dL   GFR, Estimated >12 >87 mL/min   Anion gap >20 (H) 5 - 15  D-dimer, quantitative (not at [facility])  Result Value Ref Range   D-Dimer, Quant 0.66 (H) 0.00 - 0.50 ug/mL-FEU  hCG, serum, qualitative  Result Value Ref Range   Preg, Serum NEGATIVE NEGATIVE  Ethanol  Result Value Ref Range   Alcohol, Ethyl (B) <10 <10 mg/dL  Hepatic  function panel  Result Value Ref Range   Total Protein 7.7 6.5 - 8.1 g/dL   Albumin 4.1 3.5 - 5.0 g/dL   AST 54 (H) 15 - 41 U/L   ALT 26 0 - 44 U/L   Alkaline Phosphatase 80 38 - 126 U/L   Total Bilirubin 0.5 0.3 - 1.2 mg/dL   Bilirubin, Direct 0.1 0.0 - 0.2 mg/dL   Indirect Bilirubin 0.4 0.3 - 0.9 mg/dL  Lipase, blood  Result Value Ref Range   Lipase 29 11 - 51 U/L  Magnesium  Result Value Ref Range   Magnesium 1.2 (L) 1.7 - 2.4 mg/dL  Lactic acid, plasma  Result Value Ref Range   Lactic Acid, Venous 6.9 (HH) 0.5 - 1.9 mmol/L  Lactic acid, plasma  Result Value Ref Range   Lactic Acid, Venous 5.0 (HH) 0.5 - 1.9 mmol/L  Blood gas, venous (at [facility] and [facility], not at [facility])  Result Value  Ref Range   FIO2 95.00    pH, Ven 7.400 7.25 - 7.43   pCO2, Ven 37.2 (L) 44 - 60 mmHg   pO2, Ven 31.6 (LL) 32 - 45 mmHg   Bicarbonate 22.4 20.0 - 28.0 mmol/L   Acid-base deficit 1.6 0.0 - 2.0 mmol/L   O2 Saturation 54.7 %   Patient temperature 37.0   Urinalysis, Routine w reflex microscopic Urine, Clean Catch  Result Value Ref Range   Color, Urine STRAW (A) YELLOW   APPearance CLEAR CLEAR   Specific Gravity, Urine 1.032 (H) 1.005 - 1.030   pH 5.0 5.0 - 8.0   Glucose, UA NEGATIVE NEGATIVE mg/dL   Hgb urine dipstick NEGATIVE NEGATIVE   Bilirubin Urine NEGATIVE NEGATIVE   Ketones, ur NEGATIVE NEGATIVE mg/dL   Protein, ur NEGATIVE NEGATIVE mg/dL   Nitrite NEGATIVE NEGATIVE   Leukocytes,Ua NEGATIVE NEGATIVE  Rapid urine drug screen (hospital performed)  Result Value Ref Range   Opiates NONE DETECTED NONE DETECTED   Cocaine NONE DETECTED NONE DETECTED   Benzodiazepines NONE DETECTED NONE DETECTED   Amphetamines NONE DETECTED NONE DETECTED   Tetrahydrocannabinol NONE DETECTED NONE DETECTED   Barbiturates NONE DETECTED NONE DETECTED  TSH  Result Value Ref Range   TSH 1.991 0.350 - 4.500 uIU/mL  Procalcitonin - Baseline  Result Value Ref Range   Procalcitonin 0.57 ng/mL  Comprehensive  metabolic panel  Result Value Ref Range   Sodium 135 135 - 145 mmol/L   Potassium 4.5 3.5 - 5.1 mmol/L   Chloride 107 98 - 111 mmol/L   CO2 19 (L) 22 - 32 mmol/L   Glucose, Bld 233 (H) 70 - 99 mg/dL   BUN 5 (L) 6 - 20 mg/dL   Creatinine, Ser 6.62 0.44 - 1.00 mg/dL   Calcium 6.8 (L) 8.9 - 10.3 mg/dL   Total Protein 5.7 (L) 6.5 - 8.1 g/dL   Albumin 3.1 (L) 3.5 - 5.0 g/dL   AST 78 (H) 15 - 41 U/L   ALT 26 0 - 44 U/L   Alkaline Phosphatase 63 38 - 126 U/L   Total Bilirubin 0.7 0.3 - 1.2 mg/dL   GFR, Estimated >94 >76 mL/min   Anion gap 9 5 - 15  Magnesium  Result Value Ref Range   Magnesium 1.5 (L) 1.7 - 2.4 mg/dL  Phosphorus  Result Value Ref Range   Phosphorus <1.0 (LL) 2.5 - 4.6 mg/dL  Lactic acid, plasma  Result Value Ref Range   Lactic Acid, Venous 1.7 0.5 - 1.9 mmol/L  CBC  Result Value Ref Range   WBC 2.9 (L) 4.0 - 10.5 K/uL   RBC 3.35 (L) 3.87 - 5.11 MIL/uL   Hemoglobin 10.6 (L) 12.0 - 15.0 g/dL   HCT 54.6 (L) 36 - 46 %   MCV 94.0 80.0 - 100.0 fL   MCH 31.6 26.0 - 34.0 pg   MCHC 33.7 30.0 - 36.0 g/dL   RDW 50.3 (H) 54.6 - 56.8 %   Platelets 115 (L) 150 - 400 K/uL   nRBC 0.0 0.0 - 0.2 %  Troponin I (High Sensitivity)  Result Value Ref Range   Troponin I (High Sensitivity) 3 <18 ng/L  Troponin I (High Sensitivity)  Result Value Ref Range   Troponin I (High Sensitivity) 3 <18 ng/L   Time Spent: 40 mins  Maryln Manuel, MD [facility]   01/27/2020  6:59 PM How to contact the [facility] Attending or Consulting provider 7A - 7P or covering provider during after  hours 7P -7A, for this patient?  1. Check the care team in [facility] and look for a) attending/consulting TRH provider listed and b) the [facility] team listed 2. Log into [email or web address] and use [facility] universal password to access. If you do not have the password, please contact the hospital operator. 3. Locate the [facility] provider you are looking for under [facility] and page to a number that you can be directly  reached. 4. If you still have difficulty reaching the provider, please page the [facility] (Director on Call) for the Hospitalists listed on amion for assistance.

## 2020-01-28 NOTE — ED Notes (Addendum)
CRITICAL VALUE ALERT  Critical Value:  Lactic acid 2.9  Date & Time Notied:  01/28/2020 1305  Provider Notified: dr.johnson  Orders Received/Actions taken: md notified

## 2020-01-28 NOTE — TOC Initial Note (Signed)
Transition of Care [facility]) - Initial/Assessment Note    Patient Details  Name: Merlyn Bollen MRN: [id number] Date of Birth: [birth year]  Transition of Care [facility]) CM/SW Contact:    Annice Needy, LCSW Phone Number: 01/28/2020, 10:18 AM  Clinical Narrative:                 Patient from home, admitted COVID+. Consulted for SA. Discussed consult with patient. Patient request ETOH resources not SA resources. Resources printed and provided to patient via RN as patinet is on airborne precautions.   Expected Discharge Plan: Home/Self Care Barriers to Discharge: Continued Medical Work up   Patient Goals and CMS Choice Patient states their goals for this hospitalization and ongoing recovery are:: home      Expected Discharge Plan and Services Expected Discharge Plan: Home/Self Care   Discharge Planning Services: Other - See comment (ETOH resources)   Living arrangements for the past 2 months: Single Family Home                                      Prior Living Arrangements/Services Living arrangements for the past 2 months: Single Family Home                     Activities of Daily Living      Permission Sought/Granted                  Emotional Assessment Appearance:: Appears stated age   Affect (typically observed): Appropriate Orientation: : Oriented to Self, Oriented to Place, Oriented to  Time, Oriented to Situation Alcohol / Substance Use: Alcohol Use Psych Involvement: No (comment)  Admission diagnosis:  Lactic acidosis [E87.2] Patient Active Problem List   Diagnosis Date Noted  . Lactic acidosis 01/27/2020  . COPD (chronic obstructive pulmonary disease) (HCC)   . Hypertension   . Pancreatic cyst 07/22/2019  . Chronic abdominal pain 07/22/2019  . Abnormal LFTs 07/22/2019  . Abnormal MRI of abdomen 07/22/2019  . Bile duct stricture 07/22/2019  . Common bile duct dilation 07/22/2019  . Hepatitis C, chronic (HCC) 05/06/2019  . Abnormal  ultrasound of abdomen 05/06/2019  . Hepatitis C antibody positive in blood 03/10/2019  . Alcoholism (HCC) 03/10/2019  . Loss of weight 03/10/2019  . DYSPNEA 10/24/2009  . ANXIETY STATE, UNSPECIFIED 10/21/2009  . NONDEPENDENT TOBACCO USE DISORDER 10/21/2009  . INSOMNIA UNSPECIFIED 10/21/2009  . PALPITATIONS 10/21/2009   PCP:  Suzan Slick, MD Pharmacy:   [facility] [address], [place] - [address] [place] [address] Phone: [phone] Fax: [phone]  [facility] - [place], [place] - [address] [place] [address] [place] [address] Phone: [phone] Fax: [phone]     Social Determinants of Health (SDOH) Interventions    Readmission Risk Interventions No flowsheet data found.

## 2020-01-28 NOTE — Discharge Summary (Signed)
Discharge Summary 01/28/2020 3:51 PM   Patient discharging against medical advice  Unfortunately patient no longer willing to stay in hospital and receive treatments.  She has been counseled on the dangers of leaving hospital including risks of death, disability and other adverse outcomes and she has decided to leave and signed out against medical advise. I was unable to convince patient to stay but made sure patient was aware that she could return to ED at any time to continue treatments if she decided to do so.    Active Problems:   SOB (shortness of breath)   Lactic acidosis   Hypophosphatemia   Hypocalcemia  Prior to Admission medications   Medication Sig Start Date End Date Taking? Authorizing Provider  albuterol (VENTOLIN HFA) 108 (90 Base) MCG/ACT inhaler Inhale 1-2 puffs into the lungs every 6 (six) hours as needed for wheezing or shortness of breath. 01/23/20  Yes Long, Arlyss Repress, MD  amLODipine (NORVASC) 10 MG tablet Take 10 mg by mouth daily.   Yes [provider]  benzonatate (TESSALON) 100 MG capsule Take 1 capsule (100 mg total) by mouth every 8 (eight) hours. 01/23/20  Yes Long, Arlyss Repress, MD  melatonin 5 MG TABS Take 5 mg by mouth at bedtime as needed (sleep).   Yes [provider]  metoprolol tartrate (LOPRESSOR) 50 MG tablet Take 1.5 tablets (75 mg total) by mouth 2 (two) times daily. Patient taking differently: Take 75 mg by mouth 2 (two) times daily.  04/09/19 01/27/20 Yes Laqueta Linden, MD  nicotine (NICODERM CQ - DOSED IN MG/24 HOURS) 21 mg/24hr patch Place 21 mg onto the skin daily.   Yes [provider]  ondansetron (ZOFRAN ODT) 4 MG disintegrating tablet Take 1 tablet (4 mg total) by mouth every 8 (eight) hours as needed. 01/23/20  Yes Long, Arlyss Repress, MD  buPROPion (WELLBUTRIN XL) 300 MG 24 hr tablet Take 300 mg by mouth daily as needed (anxiety).  Patient not taking: Reported on 01/27/2020 05/19/19   [provider]  gabapentin  (NEURONTIN) 100 MG capsule Take 100 mg by mouth 3 (three) times daily as needed (pain).  06/23/19   [provider]  Glecaprevir-Pibrentasvir (MAVYRET) 100-40 MG TABS Take 3 tablets by mouth daily. Take 3 tablets by mouth daily with a meal. Patient not taking: Reported on 01/27/2020 08/06/19   Veryl Speak, FNP  ibuprofen (ADVIL) 800 MG tablet Take 1 tablet (800 mg total) by mouth every 8 (eight) hours as needed. Patient not taking: Reported on 01/27/2020 11/29/19   Elson Areas, PA-C  methocarbamol (ROBAXIN) 500 MG tablet Take 1 tablet (500 mg total) by mouth 2 (two) times daily. Patient not taking: Reported on 01/27/2020 11/26/19   Gailen Shelter, PA  omeprazole (PRILOSEC) 40 MG capsule Take 1 capsule (40 mg total) by mouth daily. Patient not taking: Reported on 01/27/2020 09/28/19 09/27/20  Mansouraty, Netty Starring., MD  sulfamethoxazole-trimethoprim (BACTRIM DS) 800-160 MG tablet Take by mouth. Patient not taking: Reported on 01/27/2020 01/18/20 01/28/20  [provider]

## 2020-01-28 NOTE — ED Notes (Signed)
Bedside commode placed at bedside.  

## 2020-01-28 NOTE — ED Notes (Signed)
Report given to oncoming nurse.

## 2020-01-28 NOTE — ED Notes (Signed)
CRITICAL VALUE ALERT  Critical Value:  Phosphorus <1  Date & Time Notied:  01/28/2020 0718  Provider Notified:   Orders Received/Actions taken: md notified

## 2020-01-29 LAB — URINE CULTURE: Culture: NO GROWTH

## 2020-02-02 ENCOUNTER — Telehealth: Payer: Self-pay

## 2020-02-02 ENCOUNTER — Encounter: Payer: Self-pay | Admitting: Internal Medicine

## 2020-02-02 ENCOUNTER — Telehealth (INDEPENDENT_AMBULATORY_CARE_PROVIDER_SITE_OTHER): Payer: Medicaid Other | Admitting: Nurse Practitioner

## 2020-02-02 ENCOUNTER — Other Ambulatory Visit: Payer: Self-pay

## 2020-02-02 ENCOUNTER — Telehealth: Payer: Self-pay | Admitting: *Deleted

## 2020-02-02 DIAGNOSIS — B182 Chronic viral hepatitis C: Secondary | ICD-10-CM

## 2020-02-02 NOTE — Progress Notes (Signed)
The patient was a no show to her visit.

## 2020-02-02 NOTE — Telephone Encounter (Signed)
Hannah Rhodes, you are scheduled for a virtual visit with your provider today.  Just as we do with appointments in the office, we must obtain your consent to participate.  Your consent will be active for this visit and any virtual visit you may have with one of our providers in the next 365 days.  If you have a MyChart account, I can also send a copy of this consent to you electronically.  All virtual visits are billed to your insurance company just like a traditional visit in the office.  As this is a virtual visit, video technology does not allow for your provider to perform a traditional examination.  This may limit your provider's ability to fully assess your condition.  If your provider identifies any concerns that need to be evaluated in person or the need to arrange testing such as labs, EKG, etc, we will make arrangements to do so.  Although advances in technology are sophisticated, we cannot ensure that it will always work on either your end or our end.  If the connection with a video visit is poor, we may have to switch to a telephone visit.  With either a video or telephone visit, we are not always able to ensure that we have a secure connection.   I need to obtain your verbal consent now.   Are you willing to proceed with your visit today?

## 2020-02-02 NOTE — Telephone Encounter (Signed)
Noted, encounter placed at front desk

## 2020-02-02 NOTE — Telephone Encounter (Signed)
Pt consented to a virtual visit today.   

## 2020-02-02 NOTE — Telephone Encounter (Signed)
Tried to call pt for video visit at 0846 and 0915, no answer, no voicemail set-up.  Stacey, please no show pt for video visit.

## 2020-03-15 ENCOUNTER — Ambulatory Visit: Payer: Medicaid Other | Admitting: Internal Medicine

## 2020-03-23 NOTE — Progress Notes (Unsigned)
Cardiology Office Note  Date: 03/24/2020   ID: Hannah Rhodes, DOB 1980/08/02, MRN [id number]  PCP:  Suzan Slick, MD  Cardiologist:  No primary care provider on file. Electrophysiologist:  None   Chief Complaint: 1 year follow-up  History of Present Illness: Hannah Rhodes is a 40 y.o. female with a history of tachycardia, COPD, HTN, chronic hepatitis C, generalized headache, panic attacks, anxiety, HPV, tobacco use disorder, exertional dyspnea and chest pain.  Last encounter with Dr. Purvis Sheffield 04/09/2019 via telemedicine.  At that visit she denied chest pain.  Had a previous low risk exercise tolerance test thousand 18.  He stated no further cardiac testing was indicated.  Suspected some component of exertional dyspnea was due to emphysema from long term tobacco use.  He is smoking a half a pack of cigarettes per day and had done so for over 24 years.  Cessation was advised.  She was currently using nicotine patches.  Her diastolic blood pressure was mildly elevated.  Her metoprolol was increased to 75 mg p.o. twice daily.  Recent ED presentation 01/27/2020 for SOB with known Covid diagnosis. She had elevated D dimer and CT chest which was negative for PE.  She was hypokalemic with potassium 2.3.  She left AMA.   She is here today for hospital follow-up.  States she feels much better after recovering from Covid virus.  Her potassium has returned to normal.  Lab work on 01/28/2020 showed potassium of 4.5.  She continues to smoke.  States she is having more issues with palpitations which wake her up in the morning.  Otherwise she denies any anginal or exertional symptoms, orthostatic symptoms, PND, orthopnea, CVA or TIA-like symptoms, DVT or PE-like symptoms, lower extremity edema, or claudication-like symptoms.  Past Medical History:  Diagnosis Date  . Alcoholism (HCC)   . Anxiety   . Chronic hepatitis C (HCC)   . COPD (chronic obstructive pulmonary disease) (HCC)    no inaler  . DDD  (degenerative disc disease), lumbar   . Fatty liver   . Generalized headaches   . GERD (gastroesophageal reflux disease)    diet controlled, no med  . Heroin use   . HPV (human papilloma virus) infection   . Hypertension   . Insomnia   . Panic attack   . Pneumonia 03/2010  . Smoker   . Tachycardia     Past Surgical History:  Procedure Laterality Date  . BIOPSY  09/28/2019   Procedure: BIOPSY;  Surgeon: Meridee Score Netty Starring., MD;  Location: [facility] ENDOSCOPY;  Service: Gastroenterology;;  . CESAREAN SECTION    . CESAREAN SECTION    . ESOPHAGOGASTRODUODENOSCOPY (EGD) WITH PROPOFOL N/A 09/28/2019   Procedure: ESOPHAGOGASTRODUODENOSCOPY (EGD) WITH PROPOFOL;  Surgeon: Meridee Score Netty Starring., MD;  Location: [facility] ENDOSCOPY;  Service: Gastroenterology;  Laterality: N/A;  . EUS  09/28/2019   Procedure: UPPER ENDOSCOPIC ULTRASOUND (EUS) LINEAR;  Surgeon: Lemar Lofty., MD;  Location: [facility] ENDOSCOPY;  Service: Gastroenterology;;  . EUS  09/28/2019   Procedure: FULL UPPER ENDOSCOPIC ULTRASOUND (EUS) RADIAL;  Surgeon: Lemar Lofty., MD;  Location: [facility] ENDOSCOPY;  Service: Gastroenterology;;  . FINE NEEDLE ASPIRATION  09/28/2019   Procedure: FINE NEEDLE ASPIRATION (FNA) LINEAR;  Surgeon: Lemar Lofty., MD;  Location: [facility] ENDOSCOPY;  Service: Gastroenterology;;  . SMALL INTESTINE SURGERY    . WISDOM TOOTH EXTRACTION      Current Outpatient Medications  Medication Sig Dispense Refill  . albuterol (VENTOLIN HFA) 108 (90 Base) MCG/ACT inhaler Inhale 1-2  puffs into the lungs every 6 (six) hours as needed for wheezing or shortness of breath. 6.7 g 0  . amLODipine (NORVASC) 10 MG tablet Take 10 mg by mouth daily.    Marland Kitchen ibuprofen (ADVIL) 800 MG tablet Take 1 tablet (800 mg total) by mouth every 8 (eight) hours as needed. 15 tablet 0  . melatonin 5 MG TABS Take 5 mg by mouth at bedtime as needed (sleep).    . metoprolol tartrate (LOPRESSOR) 50 MG tablet Take 1.5 tablets (75 mg total)  by mouth 2 (two) times daily. (Patient taking differently: Take 75 mg by mouth 2 (two) times daily.) 270 tablet 3  . nicotine (NICODERM CQ - DOSED IN MG/24 HOURS) 21 mg/24hr patch Place 21 mg onto the skin daily.    Marland Kitchen gabapentin (NEURONTIN) 100 MG capsule Take 100 mg by mouth 3 (three) times daily as needed (pain).  (Patient not taking: Reported on 03/24/2020)     No current facility-administered medications for this visit.   Allergies:  Adhesive [tape]   Social History: The patient  reports that she has been smoking cigarettes. She has a 12.00 pack-year smoking history. She has never used smokeless tobacco. She reports current alcohol use. She reports previous drug use. Drugs: Marijuana, Heroin, Oxycodone, Hydrocodone, and Cocaine.   Family History: The patient's family history includes Anxiety disorder in her sister; CAD in her mother; Heart attack in her mother; Heart failure in her mother; Restless legs syndrome in her sister.   ROS:  Please see the history of present illness. Otherwise, complete review of systems is positive for none.  All other systems are reviewed and negative.   Physical Exam: VS:  BP 110/70   Pulse 96   Ht 4\' 10"  (1.473 m)   Wt 90 lb 12.8 oz (41.2 kg)   SpO2 97%   BMI 18.98 kg/m , BMI Body mass index is 18.98 kg/m.  Wt Readings from Last 3 Encounters:  03/24/20 90 lb 12.8 oz (41.2 kg)  01/27/20 95 lb 0.3 oz (43.1 kg)  01/23/20 95 lb (43.1 kg)    General: Patient appears comfortable at rest. Neck: Supple, no elevated JVP or carotid bruits, no thyromegaly. Lungs: Clear to auscultation, nonlabored breathing at rest. Cardiac: Regular rate and rhythm, no S3 or significant systolic murmur, no pericardial rub. Extremities: No pitting edema, distal pulses 2+. Skin: Warm and dry. Musculoskeletal: No kyphosis. Neuropsychiatric: Alert and oriented x3, affect grossly appropriate.  ECG:  EKG January 27, 2020 sinus tachycardia rate of 119, biatrial enlargement, RSR  prime in V1 or V2.  Probable inferior infarct, old probable anterolateral infarct, old.  Recent Labwork: 01/27/2020: TSH 1.991 01/28/2020: ALT 26; AST 78; BUN 5; Creatinine, Ser 0.60; Hemoglobin 10.6; Magnesium 1.5; Platelets 115; Potassium 4.5; Sodium 135  No results found for: CHOL, TRIG, HDL, CHOLHDL, VLDL, LDLCALC, LDLDIRECT  Other Studies Reviewed Today:   Assessment and Plan:  1. Tachycardia   2. DOE (dyspnea on exertion)   3. Tobacco abuse   4. Essential hypertension    1. Tachycardia/palpitations Patient continues to complain of palpitations/tachycardia usually waking her up in the morning.  She states after getting up the palpitations/fast heart rate usually subside or decrease.  She is currently taking metoprolol 75 mg p.o. twice daily.  Advised she could increase the nighttime dose to 100 mg and keep the 75 mg a.m. dose.  This may likely help with her early morning palpitations/tachycardia.  Advised her to start measuring her blood pressures and her  heart rates and to call if she has any issues.  2. DOE (dyspnea on exertion) She states her dyspnea is much improved since recovering from the Covid virus.  She continues to smoke.  3. Tobacco abuse Reinforcing tobacco cessation.  Advised her that her contributions to palpitations and other issues such as heart disease, lung disease.  She states she is trying to cut down significantly.  4. Essential hypertension Blood pressure well controlled on current therapy.  Continue amlodipine 10 mg daily.   Medication Adjustments/Labs and Tests Ordered: Current medicines are reviewed at length with the patient today.  Concerns regarding medicines are outlined above.   Disposition: Follow-up with Dr. Harl Bowie or APP virtual visit in 1 month  Signed, Levell July, NP 03/24/2020 3:26 PM    [facility] [place], [place], [place] [address] Phone: [phone]; Fax: [phone]

## 2020-03-24 ENCOUNTER — Other Ambulatory Visit: Payer: Self-pay

## 2020-03-24 ENCOUNTER — Encounter: Payer: Self-pay | Admitting: Family Medicine

## 2020-03-24 ENCOUNTER — Ambulatory Visit (INDEPENDENT_AMBULATORY_CARE_PROVIDER_SITE_OTHER): Payer: Medicaid Other | Admitting: Family Medicine

## 2020-03-24 ENCOUNTER — Telehealth: Payer: Self-pay | Admitting: Family Medicine

## 2020-03-24 VITALS — BP 110/70 | HR 96 | Ht <= 58 in | Wt 90.8 lb

## 2020-03-24 DIAGNOSIS — R000 Tachycardia, unspecified: Secondary | ICD-10-CM | POA: Diagnosis not present

## 2020-03-24 DIAGNOSIS — R0609 Other forms of dyspnea: Secondary | ICD-10-CM

## 2020-03-24 DIAGNOSIS — Z720 Tobacco use: Secondary | ICD-10-CM

## 2020-03-24 DIAGNOSIS — I10 Essential (primary) hypertension: Secondary | ICD-10-CM | POA: Diagnosis not present

## 2020-03-24 DIAGNOSIS — R0600 Dyspnea, unspecified: Secondary | ICD-10-CM | POA: Diagnosis not present

## 2020-03-24 MED ORDER — METOPROLOL TARTRATE 50 MG PO TABS: ORAL_TABLET | ORAL | 11 refills | Status: DC

## 2020-03-24 NOTE — Patient Instructions (Signed)
Medication Instructions:  Your physician has recommended you make the following change in your medication:   Metoprolol 75 mg in the AM and 100 mg in the PM   *If you need a refill on your cardiac medications before your next appointment, please call your pharmacy*   Lab Work: NONE  If you have labs (blood work) drawn today and your tests are completely normal, you will receive your results only by: Marland Kitchen MyChart Message (if you have MyChart) OR . A paper copy in the mail If you have any lab test that is abnormal or we need to change your treatment, we will call you to review the results.   Testing/Procedures: NONE    Follow-Up: At [facility], you and your health needs are our priority.  As part of our continuing mission to provide you with exceptional heart care, we have created designated Provider Care Teams.  These Care Teams include your primary Cardiologist (physician) and Advanced Practice Providers (APPs -  Physician Assistants and Nurse Practitioners) who all work together to provide you with the care you need, when you need it.  We recommend signing up for the patient portal called "MyChart".  Sign up information is provided on this After Visit Summary.  MyChart is used to connect with patients for Virtual Visits (Telemedicine).  Patients are able to view lab/test results, encounter notes, upcoming appointments, etc.  Non-urgent messages can be sent to your provider as well.   To learn more about what you can do with MyChart, go to [email or web address].    Your next appointment:   1 month(s)  The format for your next appointment:   In Person  Provider:   Nena Polio, NP   Other Instructions Thank you for choosing [facility]!

## 2020-03-24 NOTE — Telephone Encounter (Signed)
  Patient Consent for Virtual Visit         Hannah Rhodes has provided verbal consent on 03/24/2020 for a virtual visit (video or telephone).   CONSENT FOR VIRTUAL VISIT FOR:  Hannah Rhodes  By participating in this virtual visit I agree to the following:  I hereby voluntarily request, consent and authorize [facility] and its employed or contracted physicians, physician assistants, nurse practitioners or other licensed health care professionals (the Practitioner), to provide me with telemedicine health care services (the "Services") as deemed necessary by the treating Practitioner. I acknowledge and consent to receive the Services by the Practitioner via telemedicine. I understand that the telemedicine visit will involve communicating with the Practitioner through live audiovisual communication technology and the disclosure of certain medical information by electronic transmission. I acknowledge that I have been given the opportunity to request an in-person assessment or other available alternative prior to the telemedicine visit and am voluntarily participating in the telemedicine visit.  I understand that I have the right to withhold or withdraw my consent to the use of telemedicine in the course of my care at any time, without affecting my right to future care or treatment, and that the Practitioner or I may terminate the telemedicine visit at any time. I understand that I have the right to inspect all information obtained and/or recorded in the course of the telemedicine visit and may receive copies of available information for a reasonable fee.  I understand that some of the potential risks of receiving the Services via telemedicine include:  Marland Kitchen Delay or interruption in medical evaluation due to technological equipment failure or disruption; . Information transmitted may not be sufficient (e.g. poor resolution of images) to allow for appropriate medical decision making by the Practitioner; and/or   . In rare instances, security protocols could fail, causing a breach of personal health information.  Furthermore, I acknowledge that it is my responsibility to provide information about my medical history, conditions and care that is complete and accurate to the best of my ability. I acknowledge that Practitioner's advice, recommendations, and/or decision may be based on factors not within their control, such as incomplete or inaccurate data provided by me or distortions of diagnostic images or specimens that may result from electronic transmissions. I understand that the practice of medicine is not an exact science and that Practitioner makes no warranties or guarantees regarding treatment outcomes. I acknowledge that a copy of this consent can be made available to me via my patient portal [facility] MyChart), or I can request a printed copy by calling the office of [facility].    I understand that my insurance will be billed for this visit.   I have read or had this consent read to me. . I understand the contents of this consent, which adequately explains the benefits and risks of the Services being provided via telemedicine.  . I have been provided ample opportunity to ask questions regarding this consent and the Services and have had my questions answered to my satisfaction. . I give my informed consent for the services to be provided through the use of telemedicine in my medical care

## 2020-04-21 NOTE — Progress Notes (Deleted)
Cardiology Office Note  Date: 04/21/2020   ID: Hannah Rhodes, DOB [birth year], MRN [id number]  PCP:  Suzan Slick, MD  Cardiologist:  No primary care provider on file. Electrophysiologist:  None   Chief Complaint: 1 year follow-up  History of Present Illness: Hannah Rhodes is a 40 y.o. female with a history of tachycardia, COPD, HTN, chronic hepatitis C, generalized headache, panic attacks, anxiety, HPV, tobacco use disorder, exertional dyspnea and chest pain.   Recent ED presentation 01/27/2020 for SOB with known Covid diagnosis. She had elevated D dimer and CT chest which was negative for PE.  She was hypokalemic with potassium 2.3.  She left AMA.   Last visit for hospital follow-up.  Was having more issues with palpitations which woke her up in the morning.  Otherwise she denied any anginal or exertional symptoms, orthostatic symptoms, PND, orthopnea, CVA or TIA-like symptoms, DVT or PE-like symptoms, lower extremity edema, or claudication-like symptoms.  Past Medical History:  Diagnosis Date  . Alcoholism (HCC)   . Anxiety   . Chronic hepatitis C (HCC)   . COPD (chronic obstructive pulmonary disease) (HCC)    no inaler  . DDD (degenerative disc disease), lumbar   . Fatty liver   . Generalized headaches   . GERD (gastroesophageal reflux disease)    diet controlled, no med  . Heroin use   . HPV (human papilloma virus) infection   . Hypertension   . Insomnia   . Panic attack   . Pneumonia 03/2010  . Smoker   . Tachycardia     Past Surgical History:  Procedure Laterality Date  . BIOPSY  09/28/2019   Procedure: BIOPSY;  Surgeon: Meridee Score Netty Starring., MD;  Location: [facility] ENDOSCOPY;  Service: Gastroenterology;;  . CESAREAN SECTION    . CESAREAN SECTION    . ESOPHAGOGASTRODUODENOSCOPY (EGD) WITH PROPOFOL N/A 09/28/2019   Procedure: ESOPHAGOGASTRODUODENOSCOPY (EGD) WITH PROPOFOL;  Surgeon: Meridee Score Netty Starring., MD;  Location: [facility] ENDOSCOPY;  Service: Gastroenterology;   Laterality: N/A;  . EUS  09/28/2019   Procedure: UPPER ENDOSCOPIC ULTRASOUND (EUS) LINEAR;  Surgeon: Lemar Lofty., MD;  Location: [facility] ENDOSCOPY;  Service: Gastroenterology;;  . EUS  09/28/2019   Procedure: FULL UPPER ENDOSCOPIC ULTRASOUND (EUS) RADIAL;  Surgeon: Lemar Lofty., MD;  Location: [facility] ENDOSCOPY;  Service: Gastroenterology;;  . FINE NEEDLE ASPIRATION  09/28/2019   Procedure: FINE NEEDLE ASPIRATION (FNA) LINEAR;  Surgeon: Lemar Lofty., MD;  Location: [facility] ENDOSCOPY;  Service: Gastroenterology;;  . SMALL INTESTINE SURGERY    . WISDOM TOOTH EXTRACTION      Current Outpatient Medications  Medication Sig Dispense Refill  . albuterol (VENTOLIN HFA) 108 (90 Base) MCG/ACT inhaler Inhale 1-2 puffs into the lungs every 6 (six) hours as needed for wheezing or shortness of breath. 6.7 g 0  . amLODipine (NORVASC) 10 MG tablet Take 10 mg by mouth daily.    Marland Kitchen gabapentin (NEURONTIN) 100 MG capsule Take 100 mg by mouth 3 (three) times daily as needed (pain).  (Patient not taking: Reported on 03/24/2020)    . ibuprofen (ADVIL) 800 MG tablet Take 1 tablet (800 mg total) by mouth every 8 (eight) hours as needed. 15 tablet 0  . melatonin 5 MG TABS Take 5 mg by mouth at bedtime as needed (sleep).    . metoprolol tartrate (LOPRESSOR) 50 MG tablet Take 1 1/2 Tablet 75 mg in the AM and Take 2 Tablets 100 mg in the PM 105 tablet 11  . nicotine (NICODERM CQ -  DOSED IN MG/24 HOURS) 21 mg/24hr patch Place 21 mg onto the skin daily.     No current facility-administered medications for this visit.   Allergies:  Adhesive [tape]   Social History: The patient  reports that she has been smoking cigarettes. She has a 12.00 pack-year smoking history. She has never used smokeless tobacco. She reports current alcohol use. She reports previous drug use. Drugs: Marijuana, Heroin, Oxycodone, Hydrocodone, and Cocaine.   Family History: The patient's family history includes Anxiety disorder in her  sister; CAD in her mother; Heart attack in her mother; Heart failure in her mother; Restless legs syndrome in her sister.   ROS:  Please see the history of present illness. Otherwise, complete review of systems is positive for none.  All other systems are reviewed and negative.   Physical Exam: VS:  There were no vitals taken for this visit., BMI There is no height or weight on file to calculate BMI.  Wt Readings from Last 3 Encounters:  03/24/20 90 lb 12.8 oz (41.2 kg)  01/27/20 95 lb 0.3 oz (43.1 kg)  01/23/20 95 lb (43.1 kg)    General: Patient appears comfortable at rest. Neck: Supple, no elevated JVP or carotid bruits, no thyromegaly. Lungs: Clear to auscultation, nonlabored breathing at rest. Cardiac: Regular rate and rhythm, no S3 or significant systolic murmur, no pericardial rub. Extremities: No pitting edema, distal pulses 2+. Skin: Warm and dry. Musculoskeletal: No kyphosis. Neuropsychiatric: Alert and oriented x3, affect grossly appropriate.  ECG:  EKG January 27, 2020 sinus tachycardia rate of 119, biatrial enlargement, RSR prime in V1 or V2.  Probable inferior infarct, old probable anterolateral infarct, old.  Recent Labwork: 01/27/2020: TSH 1.991 01/28/2020: ALT 26; AST 78; BUN 5; Creatinine, Ser 0.60; Hemoglobin 10.6; Magnesium 1.5; Platelets 115; Potassium 4.5; Sodium 135  No results found for: CHOL, TRIG, HDL, CHOLHDL, VLDL, LDLCALC, LDLDIRECT  Other Studies Reviewed Today:   Assessment and Plan:   1. Tachycardia/palpitations Patient continues to complain of palpitations/tachycardia usually waking her up in the morning.  She states after getting up the palpitations/fast heart rate usually subside or decrease.  She is currently taking metoprolol 75 mg p.o. twice daily.  Advised she could increase the nighttime dose to 100 mg and keep the 75 mg a.m. dose.  This may likely help with her early morning palpitations/tachycardia.  Advised her to start measuring her  blood pressures and her heart rates and to call if she has any issues.  2. DOE (dyspnea on exertion) She states her dyspnea is much improved since recovering from the Covid virus.  She continues to smoke.  3. Tobacco abuse Reinforcing tobacco cessation.  Advised her that her contributions to palpitations and other issues such as heart disease, lung disease.  She states she is trying to cut down significantly.  4. Essential hypertension Blood pressure well controlled on current therapy.  Continue amlodipine 10 mg daily.   Medication Adjustments/Labs and Tests Ordered: Current medicines are reviewed at length with the patient today.  Concerns regarding medicines are outlined above.   Disposition: Follow-up with Dr. Wyline Mood or APP  Signed,  Rennis Harding, NP 04/21/2020 9:53 PM    [facility] at [facility] [address] [place], [place], [place] [address] Phone: [phone]; Fax: [phone]

## 2020-04-22 ENCOUNTER — Ambulatory Visit: Payer: Medicaid Other | Admitting: Family Medicine

## 2020-05-09 ENCOUNTER — Telehealth: Payer: Self-pay

## 2020-05-09 DIAGNOSIS — K862 Cyst of pancreas: Secondary | ICD-10-CM

## 2020-05-09 NOTE — Telephone Encounter (Signed)
The pt has been advised and instructed.  All information has been sent to My Chart per pt request.    You have been scheduled for an MRI at [facility] on 05/18/20. Your appointment time is 5 pm. Please arrive 30 minutes prior to your appointment time for registration purposes. Please make certain not to have anything to eat or drink 4 hours prior to your test. In addition, if you have any metal in your body, have a pacemaker or defibrillator, please be sure to let your ordering physician know. This test typically takes 45 minutes to 1 hour to complete. Should you need to reschedule, please call [phone] to do so.

## 2020-05-09 NOTE — Telephone Encounter (Signed)
-----   Message from Loretha Stapler, RN sent at 11/05/2019 11:08 AM EDT ----- Recommend a repeat MRI/MRCP in 6 months

## 2020-05-18 ENCOUNTER — Ambulatory Visit (HOSPITAL_COMMUNITY): Admission: RE | Admit: 2020-05-18 | Payer: Medicaid Other | Source: Ambulatory Visit

## 2020-05-18 ENCOUNTER — Other Ambulatory Visit (HOSPITAL_COMMUNITY): Payer: Self-pay | Admitting: Gastroenterology

## 2020-05-18 DIAGNOSIS — K862 Cyst of pancreas: Secondary | ICD-10-CM

## 2020-05-22 ENCOUNTER — Emergency Department (HOSPITAL_COMMUNITY): Payer: Medicaid Other

## 2020-05-22 ENCOUNTER — Other Ambulatory Visit: Payer: Self-pay

## 2020-05-22 ENCOUNTER — Encounter (HOSPITAL_COMMUNITY): Payer: Self-pay

## 2020-05-22 ENCOUNTER — Emergency Department (HOSPITAL_COMMUNITY)
Admission: EM | Admit: 2020-05-22 | Discharge: 2020-05-22 | Disposition: A | Discharge status: Home / Self Care | Payer: Medicaid Other | Attending: Emergency Medicine | Admitting: Emergency Medicine

## 2020-05-22 DIAGNOSIS — R072 Precordial pain: Secondary | ICD-10-CM | POA: Insufficient documentation

## 2020-05-22 DIAGNOSIS — Z79899 Other long term (current) drug therapy: Secondary | ICD-10-CM | POA: Insufficient documentation

## 2020-05-22 DIAGNOSIS — J449 Chronic obstructive pulmonary disease, unspecified: Secondary | ICD-10-CM | POA: Insufficient documentation

## 2020-05-22 DIAGNOSIS — I10 Essential (primary) hypertension: Secondary | ICD-10-CM | POA: Insufficient documentation

## 2020-05-22 DIAGNOSIS — Z8616 Personal history of COVID-19: Secondary | ICD-10-CM | POA: Insufficient documentation

## 2020-05-22 DIAGNOSIS — F17210 Nicotine dependence, cigarettes, uncomplicated: Secondary | ICD-10-CM | POA: Insufficient documentation

## 2020-05-22 DIAGNOSIS — M79602 Pain in left arm: Secondary | ICD-10-CM | POA: Diagnosis not present

## 2020-05-22 LAB — COMPREHENSIVE METABOLIC PANEL
ALT: 28 U/L (ref 0–44)
AST: 75 U/L — ABNORMAL HIGH (ref 15–41)
Albumin: 3.7 g/dL (ref 3.5–5.0)
Alkaline Phosphatase: 96 U/L (ref 38–126)
Anion gap: 14 (ref 5–15)
BUN: 8 mg/dL (ref 6–20)
CO2: 18 mmol/L — ABNORMAL LOW (ref 22–32)
Calcium: 8.1 mg/dL — ABNORMAL LOW (ref 8.9–10.3)
Chloride: 105 mmol/L (ref 98–111)
Creatinine, Ser: 0.54 mg/dL (ref 0.44–1.00)
GFR, Estimated: >60 mL/min (ref >60)
Glucose, Bld: 123 mg/dL — ABNORMAL HIGH (ref 70–99)
Potassium: 3.5 mmol/L (ref 3.5–5.1)
Sodium: 137 mmol/L (ref 135–145)
Total Bilirubin: 0.9 mg/dL (ref 0.3–1.2)
Total Protein: 6.5 g/dL (ref 6.5–8.1)

## 2020-05-22 LAB — CBC WITH DIFFERENTIAL/PLATELET
Abs Immature Granulocytes: 0.02 10*3/uL (ref 0.00–0.07)
Basophils Absolute: 0.1 10*3/uL (ref 0.0–0.1)
Basophils Relative: 1 %
Eosinophils Absolute: 0 10*3/uL (ref 0.0–0.5)
Eosinophils Relative: 0 %
HCT: 33.6 % — ABNORMAL LOW (ref 36.0–46.0)
Hemoglobin: 11.4 g/dL — ABNORMAL LOW (ref 12.0–15.0)
Immature Granulocytes: 0 %
Lymphocytes Relative: 56 %
Lymphs Abs: 3.4 10*3/uL (ref 0.7–4.0)
MCH: 33.1 pg (ref 26.0–34.0)
MCHC: 33.9 g/dL (ref 30.0–36.0)
MCV: 97.7 fL (ref 80.0–100.0)
Monocytes Absolute: 0.5 10*3/uL (ref 0.1–1.0)
Monocytes Relative: 8 %
Neutro Abs: 2.1 10*3/uL (ref 1.7–7.7)
Neutrophils Relative %: 35 %
Platelets: 200 10*3/uL (ref 150–400)
RBC: 3.44 MIL/uL — ABNORMAL LOW (ref 3.87–5.11)
RDW: 15.6 % — ABNORMAL HIGH (ref 11.5–15.5)
WBC: 6 10*3/uL (ref 4.0–10.5)
nRBC: 0.3 % — ABNORMAL HIGH (ref 0.0–0.2)

## 2020-05-22 LAB — LIPASE, BLOOD: Lipase: 25 U/L (ref 11–51)

## 2020-05-22 LAB — TROPONIN I (HIGH SENSITIVITY)
Troponin I (High Sensitivity): 4 ng/L (ref <18)
Troponin I (High Sensitivity): 6 ng/L (ref <18)

## 2020-05-22 MED ORDER — LORAZEPAM 1 MG PO TABS
1.0000 mg | ORAL_TABLET | Freq: Once | ORAL | Status: AC
  Administered 2020-05-22: 1 mg via ORAL
  Filled 2020-05-22: qty 1

## 2020-05-22 NOTE — ED Triage Notes (Signed)
Pt to er, pt states that she was seen yesterday at [facility], states that she was seen for some tingling in her L arm, states that they could only find that she had an elevated blood etoh level, states that she is here today because she is having some chest pain, states that the pain started at 11 am when she was laying in bed, states that nothing seems to make the pain better, states that it feels like a pressure in her chest.

## 2020-05-22 NOTE — Discharge Instructions (Signed)
Work-up for the chest pain without any acute findings.  Would recommend following up with cardiology information provided above to set up an appointment.  For further outpatient work-up of the chest pain.  Return for any new or worse symptoms.  May have been anxiety related because it seems the Ativan helped.

## 2020-05-22 NOTE — ED Provider Notes (Signed)
[facility] EMERGENCY DEPARTMENT Provider Note   CSN: [id number] Arrival date & time: 05/22/20  1520     History Chief Complaint  Patient presents with  . Chest Pain    Hannah Rhodes is a 40 y.o. female.  Seen yesterday at [facility] emergency department.  She has some tingling in her left arm at that time.  They noted that she had an elevated blood alcohol level.  Now she is here today because of onset of chest pain that started at 11:00 this morning.  Substernal chest pain constant.  Has not gone away since it started.  Not made worse by any movement.  No shortness of breath no fever.  Patient thinks it may be related to anxiety.  Patient denies any leg swelling.  Past medical history significant for alcoholism anxiety chronic hepatitis COPD panic attacks heroin use.        Past Medical History:  Diagnosis Date  . Alcoholism (HCC)   . Anxiety   . Chronic hepatitis C (HCC)   . COPD (chronic obstructive pulmonary disease) (HCC)    no inaler  . DDD (degenerative disc disease), lumbar   . Fatty liver   . Generalized headaches   . GERD (gastroesophageal reflux disease)    diet controlled, no med  . Heroin use   . HPV (human papilloma virus) infection   . Hypertension   . Insomnia   . Panic attack   . Pneumonia 03/2010  . Smoker   . Tachycardia     Patient Active Problem List   Diagnosis Date Noted  . Hypophosphatemia 01/28/2020  . Hypocalcemia 01/28/2020  . COVID   . Lactic acidosis 01/27/2020  . COPD (chronic obstructive pulmonary disease) (HCC)   . Hypertension   . Pancreatic cyst 07/22/2019  . Chronic abdominal pain 07/22/2019  . Abnormal LFTs 07/22/2019  . Abnormal MRI of abdomen 07/22/2019  . Bile duct stricture 07/22/2019  . Common bile duct dilation 07/22/2019  . Hepatitis C, chronic (HCC) 05/06/2019  . Abnormal ultrasound of abdomen 05/06/2019  . Hepatitis C antibody positive in blood 03/10/2019  . Alcoholism (HCC) 03/10/2019  . Loss of weight  03/10/2019  . SOB (shortness of breath) 10/24/2009  . ANXIETY STATE, UNSPECIFIED 10/21/2009  . NONDEPENDENT TOBACCO USE DISORDER 10/21/2009  . INSOMNIA UNSPECIFIED 10/21/2009  . PALPITATIONS 10/21/2009    Past Surgical History:  Procedure Laterality Date  . BIOPSY  09/28/2019   Procedure: BIOPSY;  Surgeon: Meridee Score Netty Starring., MD;  Location: [facility] ENDOSCOPY;  Service: Gastroenterology;;  . CESAREAN SECTION    . CESAREAN SECTION    . ESOPHAGOGASTRODUODENOSCOPY (EGD) WITH PROPOFOL N/A 09/28/2019   Procedure: ESOPHAGOGASTRODUODENOSCOPY (EGD) WITH PROPOFOL;  Surgeon: Meridee Score Netty Starring., MD;  Location: [facility] ENDOSCOPY;  Service: Gastroenterology;  Laterality: N/A;  . EUS  09/28/2019   Procedure: UPPER ENDOSCOPIC ULTRASOUND (EUS) LINEAR;  Surgeon: Lemar Lofty., MD;  Location: [facility] ENDOSCOPY;  Service: Gastroenterology;;  . EUS  09/28/2019   Procedure: FULL UPPER ENDOSCOPIC ULTRASOUND (EUS) RADIAL;  Surgeon: Lemar Lofty., MD;  Location: [facility] ENDOSCOPY;  Service: Gastroenterology;;  . FINE NEEDLE ASPIRATION  09/28/2019   Procedure: FINE NEEDLE ASPIRATION (FNA) LINEAR;  Surgeon: Lemar Lofty., MD;  Location: [facility] ENDOSCOPY;  Service: Gastroenterology;;  . SMALL INTESTINE SURGERY    . WISDOM TOOTH EXTRACTION       OB History    Gravida      Para      Term      Preterm  AB      Living  7     SAB      IAB      Ectopic      Multiple      Live Births              Family History  Problem Relation Age of Onset  . Anxiety disorder Sister   . Restless legs syndrome Sister   . Heart failure Mother   . CAD Mother   . Heart attack Mother   . Colon cancer Neg Hx   . Esophageal cancer Neg Hx   . Inflammatory bowel disease Neg Hx   . Liver disease Neg Hx   . Pancreatic cancer Neg Hx   . Stomach cancer Neg Hx   . Rectal cancer Neg Hx     Social History   Tobacco Use  . Smoking status: Current Every Day Smoker    Packs/day: 0.50    Years:  24.00    Pack years: 12.00    Types: Cigarettes  . Smokeless tobacco: Never Used  . Tobacco comment: Cut back to 5 cig/day  Vaping Use  . Vaping Use: Never used  Substance Use Topics  . Alcohol use: Yes  . Drug use: Not Currently    Types: Marijuana, Heroin, Oxycodone, Hydrocodone, Cocaine    Home Medications Prior to Admission medications   Medication Sig Start Date End Date Taking? Authorizing Provider  albuterol (VENTOLIN HFA) 108 (90 Base) MCG/ACT inhaler Inhale 1-2 puffs into the lungs every 6 (six) hours as needed for wheezing or shortness of breath. 01/23/20  Yes Long, Arlyss Repress, MD  amLODipine (NORVASC) 10 MG tablet Take 10 mg by mouth daily.   Yes [provider]  melatonin 5 MG TABS Take 5 mg by mouth at bedtime as needed (sleep).   Yes [provider]  metoprolol tartrate (LOPRESSOR) 50 MG tablet Take 1 1/2 Tablet 75 mg in the AM and Take 2 Tablets 100 mg in the PM 03/24/20  Yes Netta Neat., NP  nicotine (NICODERM CQ - DOSED IN MG/24 HOURS) 21 mg/24hr patch Place 21 mg onto the skin daily.   Yes [provider]  gabapentin (NEURONTIN) 100 MG capsule Take 100 mg by mouth 3 (three) times daily as needed (pain).  Patient not taking: No sig reported 06/23/19   [provider]  ibuprofen (ADVIL) 800 MG tablet Take 1 tablet (800 mg total) by mouth every 8 (eight) hours as needed. Patient not taking: Reported on 05/22/2020 11/29/19   Elson Areas, PA-C  metoprolol tartrate (LOPRESSOR) 50 MG tablet Take by mouth. Patient not taking: No sig reported 04/09/19   [provider]    Allergies    Adhesive [tape]  Review of Systems   Review of Systems  Constitutional: Negative for chills and fever.  HENT: Negative for congestion, rhinorrhea and sore throat.   Eyes: Negative for visual disturbance.  Respiratory: Negative for cough and shortness of breath.   Cardiovascular: Positive for chest pain. Negative for leg swelling.   Gastrointestinal: Negative for abdominal pain, diarrhea, nausea and vomiting.  Genitourinary: Negative for dysuria.  Musculoskeletal: Negative for back pain and neck pain.  Skin: Negative for rash.  Neurological: Negative for dizziness, light-headedness and headaches.  Hematological: Does not bruise/bleed easily.  Psychiatric/Behavioral: Negative for confusion.    Physical Exam Updated Vital Signs BP (!) 161/101   Pulse 64   Temp 99 F (37.2 C) (Oral)   Resp  15   Ht 1.473 m (4\' 10" )   Wt 43.1 kg   SpO2 98%   BMI 19.86 kg/m   Physical Exam Vitals and nursing note reviewed.  Constitutional:      General: She is not in acute distress.    Appearance: Normal appearance. She is well-developed and well-nourished.  HENT:     Head: Normocephalic and atraumatic.  Eyes:     Extraocular Movements: Extraocular movements intact.     Conjunctiva/sclera: Conjunctivae normal.     Pupils: Pupils are equal, round, and reactive to light.  Cardiovascular:     Rate and Rhythm: Normal rate and regular rhythm.     Heart sounds: No murmur heard.   Pulmonary:     Effort: Pulmonary effort is normal. No respiratory distress.     Breath sounds: Normal breath sounds.  Abdominal:     Palpations: Abdomen is soft.     Tenderness: There is no abdominal tenderness.  Musculoskeletal:        General: No swelling or edema. Normal range of motion.     Cervical back: Neck supple.  Skin:    General: Skin is warm and dry.     Capillary Refill: Capillary refill takes less than 2 seconds.  Neurological:     General: No focal deficit present.     Mental Status: She is alert and oriented to person, place, and time.     Cranial Nerves: No cranial nerve deficit.     Sensory: No sensory deficit.  Psychiatric:        Mood and Affect: Mood and affect normal.     ED Results / Procedures / Treatments   Labs (all labs ordered are listed, but only abnormal results are displayed) Labs Reviewed  CBC WITH  DIFFERENTIAL/PLATELET - Abnormal; Notable for the following components:      Result Value   RBC 3.44 (*)    Hemoglobin 11.4 (*)    HCT 33.6 (*)    RDW 15.6 (*)    nRBC 0.3 (*)    All other components within normal limits  COMPREHENSIVE METABOLIC PANEL - Abnormal; Notable for the following components:   CO2 18 (*)    Glucose, Bld 123 (*)    Calcium 8.1 (*)    AST 75 (*)    All other components within normal limits  LIPASE, BLOOD  TROPONIN I (HIGH SENSITIVITY)  TROPONIN I (HIGH SENSITIVITY)    EKG EKG Interpretation  Date/Time:  Sunday May 22 2020 15:28:09 EST Ventricular Rate:  77 PR Interval:    QRS Duration: 78 QT Interval:  403 QTC Calculation: 457 R Axis:   10 Text Interpretation: Sinus rhythm Probable left atrial enlargement Low voltage, precordial leads Baseline wander in lead(s) V3 V5 Confirmed by Vanetta MuldersZackowski, Mataya Kilduff [phone]([id number]) on 05/22/2020 4:02:44 PM   Radiology DG Chest Port 1 View  Result Date: 05/22/2020 CLINICAL DATA:  Chest pain today. EXAM: PORTABLE CHEST 1 VIEW COMPARISON:  Radiograph and CT 01/27/2019 FINDINGS: The cardiomediastinal contours are normal. The lungs are clear. Pulmonary vasculature is normal. No consolidation, pleural effusion, or pneumothorax. Minor scoliosis of the lower thoracic spine. No acute osseous abnormalities are seen. IMPRESSION: No acute chest findings. Electronically Signed   By: Narda RutherfordMelanie  Sanford M.D.   On: 05/22/2020 16:27    Procedures Procedures   Medications Ordered in ED Medications  LORazepam (ATIVAN) tablet 1 mg (1 mg Oral Given 05/22/20 1629)    ED Course  I have reviewed the triage vital signs and  the nursing notes.  Pertinent labs & imaging results that were available during my care of the patient were reviewed by me and considered in my medical decision making (see chart for details).    MDM Rules/Calculators/A&P                          Work-up here reassuring with troponin being negative despite constant chest pain  at 11:00 this morning.  Chest x-ray negative.  EKG without acute changes.  Do not have any concerns for pulmonary embolus.  Not tachycardic here.  Patient given 1 mg of Ativan orally feels much better.  Symptoms resolved.  Do not feel that we need a delta troponin.   Final Clinical Impression(s) / ED Diagnoses Final diagnoses:  Precordial pain    Rx / DC Orders ED Discharge Orders    None       Vanetta Mulders, MD 05/22/20 [phone]

## 2020-06-12 IMAGING — US US ABDOMEN COMPLETE W/ ELASTOGRAPHY
1 series · 12 of 25 positions shown · non-contrast
Comparison: Right upper quadrant ultrasound on 03/22/2017

CLINICAL DATA: Chronic hepatitis-C.  Alcoholism.  Weight loss.

EXAM:
ULTRASOUND ABDOMEN
ULTRASOUND HEPATIC ELASTOGRAPHY
TECHNIQUE: Sonography of the upper abdomen was performed. In addition,
ultrasound elastography evaluation of the liver was performed. A
region of interest was placed within the right lobe of the liver.
Following application of a compressive sonographic pulse, tissue
compressibility was assessed. Multiple assessments were performed at
the selected site. Median tissue compressibility was determined.
Previously, hepatic stiffness was assessed by shear wave velocity.
Based on recently published Society of Radiologists in Ultrasound
consensus article, reporting is now recommended to be performed in
the SI units of pressure (kiloPascals) representing hepatic
stiffness/elasticity. The obtained result is compared to the
published reference standards. (cACLD= compensated Advanced Chronic
Liver Disease)

[Series 1: us abdomen complete w/ elastography · 12 of 109 slices shown]
[im 5/109]
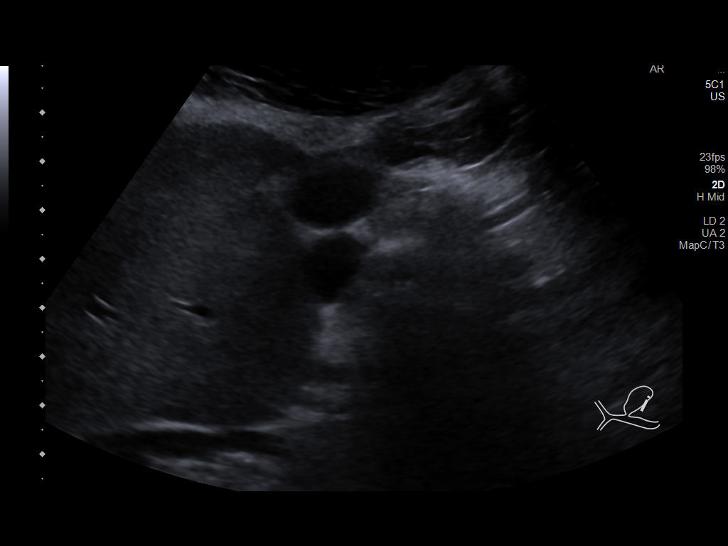
[im 14/109]
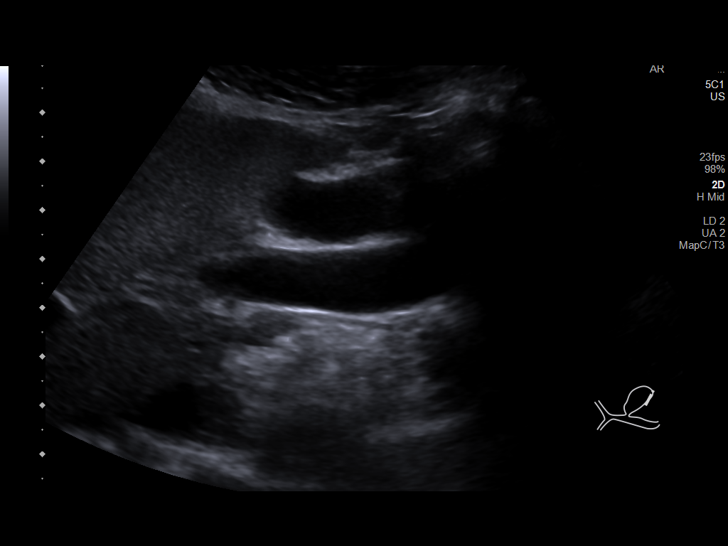
[im 23/109]
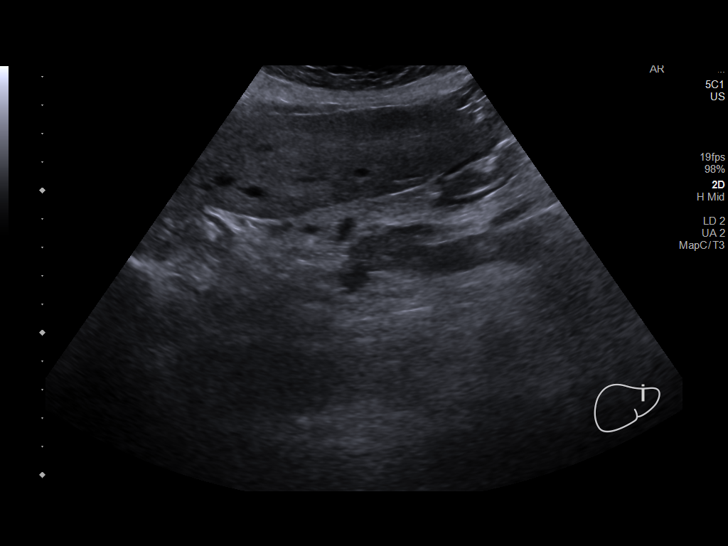
[im 32/109]
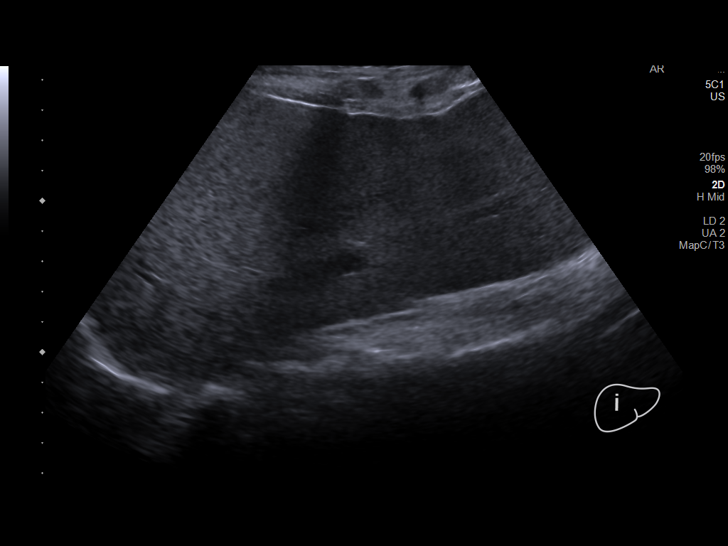
[im 41/109]
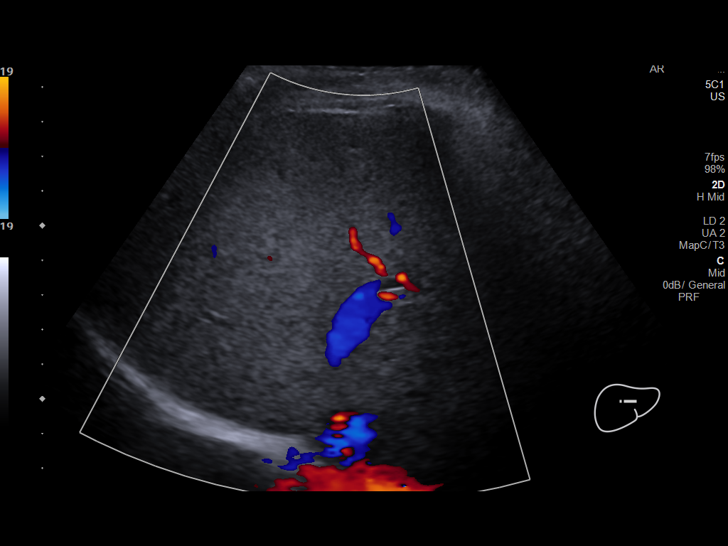
[im 50/109]
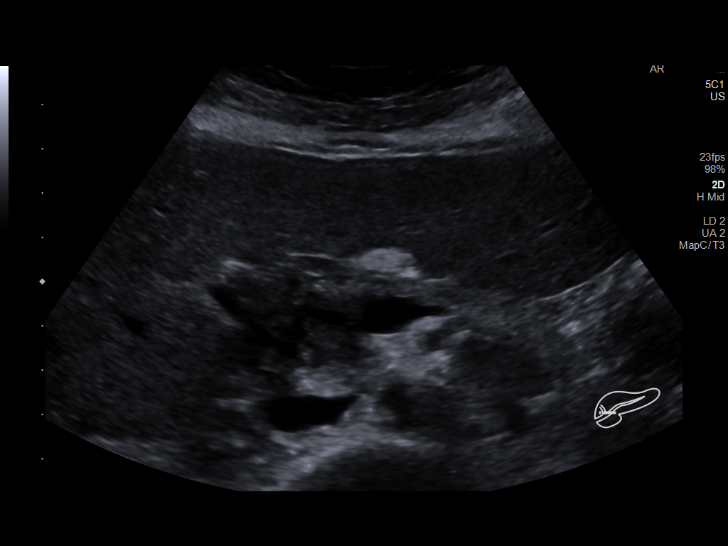
[im 59/109]
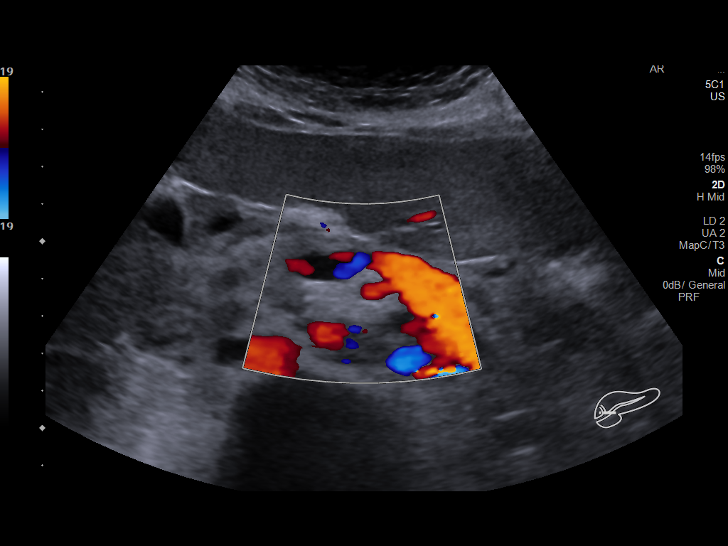
[im 68/109]
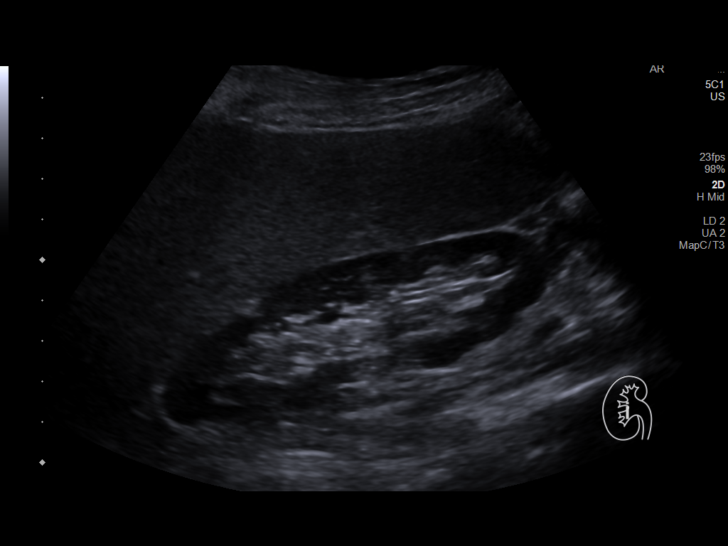
[im 77/109]
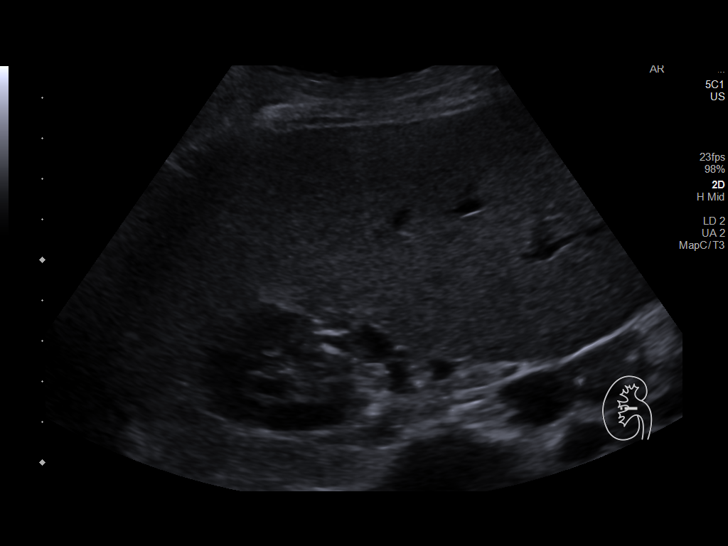
[im 86/109]
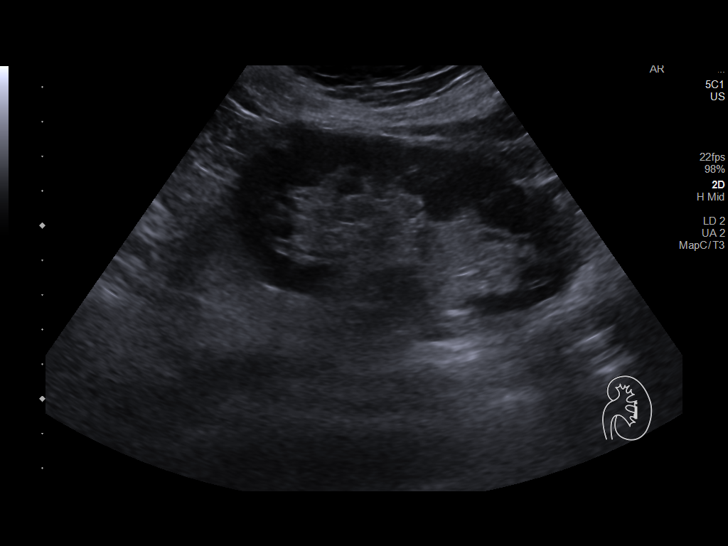
[im 95/109]
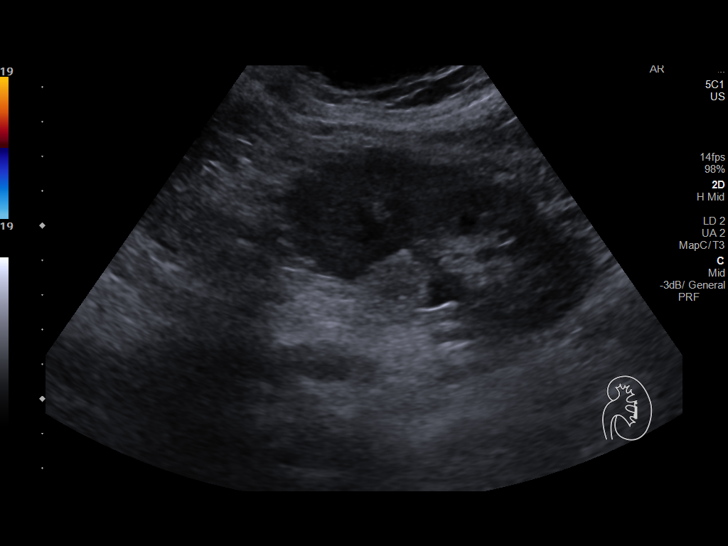
[im 104/109]
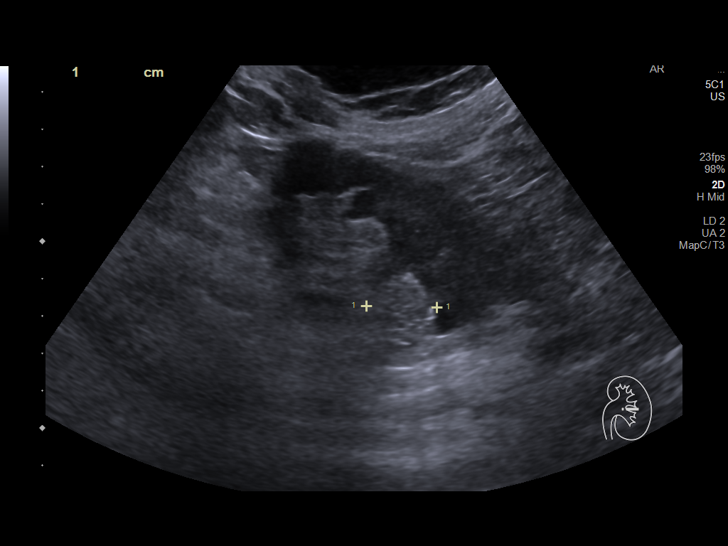

[12 of 25 positions shown; findings below may reference images not displayed]

FINDINGS: ULTRASOUND ABDOMEN

Gallbladder: No gallstones or wall thickening visualized. No
sonographic Murphy sign noted by sonographer.

Common bile duct: Diameter: Mild biliary ductal dilatation with
common bile duct measures 7 mm in diameter.

Liver: Mildly increased echogenicity of the hepatic parenchyma,
consistent with hepatic steatosis. No hepatic mass identified.
Portal vein is patent on color Doppler imaging with normal direction
of blood flow towards the liver.

IVC: No abnormality visualized.

Pancreas: A solid appearing hypoechoic lesion is seen in the region
of the pancreatic head measuring approximately 2 cm. This could
represent a pancreatic mass or peripancreatic lymph node. No
evidence of pancreatic ductal dilatation.

Spleen: Size and appearance within normal limits.

Right Kidney: Length: 10.4 cm. Echogenicity within normal limits. No
mass or hydronephrosis visualized.

Left Kidney: Length: 10.7 cm. Echogenicity within normal limits. A
solid appearing mildly hyperechoic mass is seen in the midpole the
left kidney measuring 2.7 cm in maximum diameter, suspicious for
renal neoplasm. No other mass or hydronephrosis visualized.

Abdominal aorta: No aneurysm visualized.

Other findings: None.

ULTRASOUND HEPATIC ELASTOGRAPHY

Device: Siemens Helix VTQ

Patient position: Supine

Transducer [id number]

Number of measurements: 10

Hepatic segment:  8

Median kPa:

IQR:

IQR/Median kPa ratio:

Data quality:  Good

Diagnostic category: ?9 kPa: in the absence of other known clinical
signs, rules out cACLD
IMPRESSION: ULTRASOUND ABDOMEN:

Mildly hepatic steatosis.  No hepatic mass identified.

Mild biliary ductal dilatation, and possible 2 cm pancreatic head
mass or peripancreatic lymph node. Abdomen MRI without and with
contrast is recommended further characterization.

2.7 cm solid-appearing left renal mass, suspicious for renal
neoplasm. This can also be further characterized by abdomen MRI
without and with contrast.

ULTRASOUND HEPATIC ELASTOGRAPHY:

Median kPa:

Diagnostic category: ?9 kPa: in the absence of other known clinical
signs, rules out cACLD

The use of hepatic elastography is applicable to patients with viral
hepatitis and non-alcoholic fatty liver disease. At this time, there
is insufficient data for the referenced cut-off values and use in
other causes of liver disease, including alcoholic liver disease.
Patients, however, may be assessed by elastography and serve as
their own reference standard/baseline.

In patients with non-alcoholic liver disease, the values suggesting
compensated advanced chronic liver disease (cACLD) may be lower, and
patients may need additional testing with elasticity results of [DATE]
kPa.

Please note that abnormal hepatic elasticity and shear wave
velocities may also be identified in clinical settings other than
with hepatic fibrosis, such as: acute hepatitis, elevated right
heart and central venous pressures including use of beta blockers,
Meuble disease (Tiger), infiltrative processes such as
mastocytosis/amyloidosis/infiltrative tumor/lymphoma, extrahepatic
cholestasis, with hyperemia in the post-prandial state, and with
liver transplantation. Correlation with patient history, laboratory
data, and clinical condition recommended.

Diagnostic Categories:

?5 kPa: high probability of being normal

?9 kPa: in the absence of other known clinical signs, rules [DATE] kPa and ?13 kPa: suggestive of cACLD, but needs further testing

>13 kPa: highly suggestive of cACLD

?17 kPa: highly suggestive of cACLD with an increased probability of
clinically significant portal hypertension

## 2020-09-08 NOTE — Progress Notes (Signed)
Cardiology Office Note   Date:  09/11/2020   ID:  Hannah Rhodes, DOB 05/30/1980, MRN [id number]  PCP:  Suzan Slick, MD  Cardiologist:   Dietrich Pates, MD   Pt referred for tachycardia and chest pain   History of Present Illness: Hannah Rhodes is a 40 y.o. female with a history of L arm pain and chest pain   Seen in ED on May 22, 2020   CP was constant, at times lasting hours   No SOB  Not worse with movement The pt says she can never get her HR to stay uner control   Has been on metoprolol for a couple years   Dose has been increased     She denies presynope/syncope  Does say that panic attacks  Pt has 9 kids   9 yrs to 68 yo  Hx fo EtOH abuse   Pt says she drinks Gatorade 0 and water     Current Meds  Medication Sig   albuterol (VENTOLIN HFA) 108 (90 Base) MCG/ACT inhaler Inhale 1-2 puffs into the lungs every 6 (six) hours as needed for wheezing or shortness of breath.   amLODipine (NORVASC) 10 MG tablet Take 10 mg by mouth daily.   folic acid (FOLVITE) 1 MG tablet Take 1 mg by mouth daily.   melatonin 5 MG TABS Take 5 mg by mouth at bedtime as needed (sleep).   metoprolol tartrate (LOPRESSOR) 50 MG tablet Take 100 mg by mouth 2 (two) times daily. 100 mg BID   Potassium Chloride ER 20 MEQ TBCR Take 20 mEq by mouth daily.     Allergies:   Adhesive [tape]   Past Medical History:  Diagnosis Date   Alcoholism (HCC)    Anxiety    Chronic hepatitis C (HCC)    COPD (chronic obstructive pulmonary disease) (HCC)    no inaler   DDD (degenerative disc disease), lumbar    Fatty liver    Generalized headaches    GERD (gastroesophageal reflux disease)    diet controlled, no med   Heroin use    HPV (human papilloma virus) infection    Hypertension    Insomnia    Panic attack    Pneumonia 03/2010   Smoker    Tachycardia     Past Surgical History:  Procedure Laterality Date   BIOPSY  09/28/2019   Procedure: BIOPSY;  Surgeon: Lemar Lofty., MD;  Location: [facility]  ENDOSCOPY;  Service: Gastroenterology;;   CESAREAN SECTION     CESAREAN SECTION     ESOPHAGOGASTRODUODENOSCOPY (EGD) WITH PROPOFOL N/A 09/28/2019   Procedure: ESOPHAGOGASTRODUODENOSCOPY (EGD) WITH PROPOFOL;  Surgeon: Lemar Lofty., MD;  Location: [facility] ENDOSCOPY;  Service: Gastroenterology;  Laterality: N/A;   EUS  09/28/2019   Procedure: UPPER ENDOSCOPIC ULTRASOUND (EUS) LINEAR;  Surgeon: Lemar Lofty., MD;  Location: [facility] ENDOSCOPY;  Service: Gastroenterology;;   EUS  09/28/2019   Procedure: FULL UPPER ENDOSCOPIC ULTRASOUND (EUS) RADIAL;  Surgeon: Lemar Lofty., MD;  Location: [facility] ENDOSCOPY;  Service: Gastroenterology;;   FINE NEEDLE ASPIRATION  09/28/2019   Procedure: FINE NEEDLE ASPIRATION (FNA) LINEAR;  Surgeon: Lemar Lofty., MD;  Location: [facility] ENDOSCOPY;  Service: Gastroenterology;;   SMALL INTESTINE SURGERY     WISDOM TOOTH EXTRACTION       Social History:  The patient  reports that she has been smoking cigarettes. She has a 12.00 pack-year smoking history. She has never used smokeless tobacco. She reports current alcohol use. She reports previous drug  use. Drugs: Marijuana, Heroin, Oxycodone, Hydrocodone, and Cocaine.   Family History:  The patient's family history includes Anxiety disorder in her sister; CAD in her mother; Heart attack in her mother; Heart failure in her mother; Restless legs syndrome in her sister.    ROS:  Please see the history of present illness. All other systems are reviewed and  Negative to the above problem except as noted.    PHYSICAL EXAM: VS:  BP 98/66   Pulse 100   Ht 4\' 11"  (1.499 m)   Wt 91 lb 12.8 oz (41.6 kg)   SpO2 98%   BMI 18.54 kg/m   GEN: Thin 40 yo  in no acute distress  HEENT: normal  Neck: no JVD, carotid bruits Cardiac: RRR; no murmurs  No LE edema  Respiratory:  clear to auscultation bilaterally, normal work of breathing GI: soft, nontender, nondistended, + BS  No hepatomegaly  MS: no deformity  Moving all extremities   Skin: warm and dry, no rash Neuro:  Strength and sensation are intact Psych: euthymic mood, full affect   EKG:  EKG is not ordered today.  ON 05/22/20 SR      Lipid Panel No results found for: CHOL, TRIG, HDL, CHOLHDL, VLDL, LDLCALC, LDLDIRECT    Wt Readings from Last 3 Encounters:  09/09/20 91 lb 12.8 oz (41.6 kg)  05/22/20 95 lb (43.1 kg)  03/24/20 90 lb 12.8 oz (41.2 kg)      ASSESSMENT AND PLAN:  1  Chest pain  Atypical   I do not think it is cardiac in origin   Sounds poss GI  vs muscular    Follow   Stay hydrated    2  Tachycardia    I do not think she has signfi arrhythmia    Will set up for monitor 3 day Zio patch   Will also check TSH, CMET and Mg  3  EtOH   Encouraged to refrain from use     Current medicines are reviewed at length with the patient today.  The patient does not have concerns regarding medicines.  Signed, 05/22/20, MD  09/11/2020 5:19 PM    [facility] [address] [place], [place], [place]  [place] Phone: [phone]; Fax: [phone]

## 2020-09-09 ENCOUNTER — Other Ambulatory Visit: Payer: Self-pay | Admitting: Internal Medicine

## 2020-09-09 ENCOUNTER — Encounter: Payer: Self-pay | Admitting: Internal Medicine

## 2020-09-09 ENCOUNTER — Other Ambulatory Visit (HOSPITAL_COMMUNITY)
Admission: RE | Admit: 2020-09-09 | Discharge: 2020-09-09 | Disposition: A | Discharge status: Home / Self Care | Payer: Medicaid Other | Source: Ambulatory Visit | Attending: Internal Medicine | Admitting: Internal Medicine

## 2020-09-09 ENCOUNTER — Ambulatory Visit (INDEPENDENT_AMBULATORY_CARE_PROVIDER_SITE_OTHER): Payer: Medicaid Other

## 2020-09-09 ENCOUNTER — Ambulatory Visit (INDEPENDENT_AMBULATORY_CARE_PROVIDER_SITE_OTHER): Payer: Medicaid Other | Admitting: Internal Medicine

## 2020-09-09 ENCOUNTER — Other Ambulatory Visit: Payer: Self-pay

## 2020-09-09 VITALS — BP 98/66 | HR 100 | Ht 59.0 in | Wt 91.8 lb

## 2020-09-09 DIAGNOSIS — R002 Palpitations: Secondary | ICD-10-CM

## 2020-09-09 LAB — COMPREHENSIVE METABOLIC PANEL
ALT: 56 U/L — ABNORMAL HIGH (ref 0–44)
AST: 117 U/L — ABNORMAL HIGH (ref 15–41)
Albumin: 3.7 g/dL (ref 3.5–5.0)
Alkaline Phosphatase: 118 U/L (ref 38–126)
Anion gap: 12 (ref 5–15)
BUN: 7 mg/dL (ref 6–20)
CO2: 21 mmol/L — ABNORMAL LOW (ref 22–32)
Calcium: 9.6 mg/dL (ref 8.9–10.3)
Chloride: 101 mmol/L (ref 98–111)
Creatinine, Ser: 0.53 mg/dL (ref 0.44–1.00)
GFR, Estimated: >60 mL/min (ref >60)
Glucose, Bld: 103 mg/dL — ABNORMAL HIGH (ref 70–99)
Potassium: 3.3 mmol/L — ABNORMAL LOW (ref 3.5–5.1)
Sodium: 134 mmol/L — ABNORMAL LOW (ref 135–145)
Total Bilirubin: 0.4 mg/dL (ref 0.3–1.2)
Total Protein: 6.5 g/dL (ref 6.5–8.1)

## 2020-09-09 LAB — MAGNESIUM: Magnesium: 1.3 mg/dL — ABNORMAL LOW (ref 1.7–2.4)

## 2020-09-09 LAB — TSH: TSH: 1.348 u[IU]/mL (ref 0.350–4.500)

## 2020-09-09 NOTE — Patient Instructions (Signed)
Medication Instructions:  Your physician recommends that you continue on your current medications as directed. Please refer to the Current Medication list given to you today.  *If you need a refill on your cardiac medications before your next appointment, please call your pharmacy*   Lab Work: Your physician recommends that you return for lab work in: Today( TSH, CMET, Mg)   If you have labs (blood work) drawn today and your tests are completely normal, you will receive your results only by: MyChart Message (if you have MyChart) OR A paper copy in the mail If you have any lab test that is abnormal or we need to change your treatment, we will call you to review the results.   Testing/Procedures: Christena Deem- Long Term Monitor Instructions   Your physician has requested you wear your ZIO patch monitor___3____days.   This is a single patch monitor.  [employer] supplies one patch monitor per enrollment.  Additional stickers are not available.   Please do not apply patch if you will be having a Nuclear Stress Test, Echocardiogram, Cardiac CT, MRI, or Chest Xray during the time frame you would be wearing the monitor. The patch cannot be worn during these tests.  You cannot remove and re-apply the ZIO XT patch monitor.   Your ZIO patch monitor will be sent USPS Priority mail from [facility] directly to your home address. The monitor may also be mailed to a PO BOX if home delivery is not available.   It may take 3-5 days to receive your monitor after you have been enrolled.   Once you have received you monitor, please review enclosed instructions.  Your monitor has already been registered assigning a specific monitor serial # to you.   Applying the monitor   Shave hair from upper left chest.   Hold abrader disc by orange tab.  Rub abrader in 40 strokes over left upper chest as indicated in your monitor instructions.   Clean area with 4 enclosed alcohol pads .  Use all pads to assure are  is cleaned thoroughly.  Let dry.   Apply patch as indicated in monitor instructions.  Patch will be place under collarbone on left side of chest with arrow pointing upward.   Rub patch adhesive wings for 2 minutes.Remove white label marked "1".  Remove white label marked "2".  Rub patch adhesive wings for 2 additional minutes.   While looking in a mirror, press and release button in center of patch.  A small green light will flash 3-4 times .  This will be your only indicator the monitor has been turned on.     Do not shower for the first 24 hours.  You may shower after the first 24 hours.   Press button if you feel a symptom. You will hear a small click.  Record Date, Time and Symptom in the Patient Log Book.   When you are ready to remove patch, follow instructions on last 2 pages of Patient Log Book.  Stick patch monitor onto last page of Patient Log Book.   Place Patient Log Book in [place] box.  Use locking tab on box and tape box closed securely.  The [employer] and [employer] has [employer] on it.  Please place in mailbox as soon as possible.  Your physician should have your test results approximately 7 days after the monitor has been mailed back to [facility].   Call [facility] Customer Care at [phone] if you have questions regarding your  ZIO XT patch monitor.  Call them immediately if you see an orange light blinking on your monitor.   If your monitor falls off in less than 4 days contact our Monitor department at [phone].  If your monitor becomes loose or falls off after 4 days call [employer] at [phone] for suggestions on securing your monitor.     Follow-Up: At [facility], you and your health needs are our priority.  As part of our continuing mission to provide you with exceptional heart care, we have created designated Provider Care Teams.  These Care Teams include your primary Cardiologist (physician) and Advanced Practice Providers (APPs -  Physician  Assistants and Nurse Practitioners) who all work together to provide you with the care you need, when you need it.  We recommend signing up for the patient portal called "MyChart".  Sign up information is provided on this After Visit Summary.  MyChart is used to connect with patients for Virtual Visits (Telemedicine).  Patients are able to view lab/test results, encounter notes, upcoming appointments, etc.  Non-urgent messages can be sent to your provider as well.   To learn more about what you can do with MyChart, go to [email or web address].    Your next appointment:    Pending test results   The format for your next appointment:   In Person  Provider:   Dietrich Pates, MD   Other Instructions Thank you for choosing [facility]!

## 2020-09-12 ENCOUNTER — Telehealth: Payer: Self-pay | Admitting: *Deleted

## 2020-09-12 ENCOUNTER — Other Ambulatory Visit: Payer: Self-pay | Admitting: Internal Medicine

## 2020-09-12 DIAGNOSIS — Z79899 Other long term (current) drug therapy: Secondary | ICD-10-CM

## 2020-09-12 MED ORDER — ALBUTEROL SULFATE HFA 108 (90 BASE) MCG/ACT IN AERS: 1.0000 | INHALATION_SPRAY | Freq: Four times a day (QID) | RESPIRATORY_TRACT | 0 refills | Status: AC | PRN

## 2020-09-12 MED ORDER — MAGNESIUM OXIDE 400 MG PO CAPS: 400.0000 mg | ORAL_CAPSULE | Freq: Every day | ORAL | 3 refills | Status: DC

## 2020-09-12 NOTE — Telephone Encounter (Signed)
Pt notified and orders placed  

## 2020-09-12 NOTE — Telephone Encounter (Signed)
-----   Message from Pricilla Riffle, MD sent at 09/12/2020  1:14 PM EDT ----- Thyroid function is normal Magnesium is low (which will affect potassium)   Potassium is also low  Liverf function tests also abnormal/ elevated  I would add 400 mg magnesium oxide daily   Continue KCL 20 F/U BMET and Magnesium in 4 wks

## 2020-09-13 ENCOUNTER — Telehealth: Payer: Self-pay

## 2020-09-13 NOTE — Telephone Encounter (Signed)
Dr Wyline Mood is her PCP    I can do 1 month but then primary should be calling in

## 2020-09-13 NOTE — Telephone Encounter (Signed)
Called [employer] for Prior Authorization on pt's Albuterol inhaler and was told by a representative that they will cover a prescription written for ProAir Inhaler (Albuterol is generic for ProAir)first. Please advise.

## 2020-09-21 ENCOUNTER — Telehealth: Payer: Self-pay

## 2020-09-21 NOTE — Telephone Encounter (Signed)
Pt notified and voiced understanding. Pt had no questions or concerns at this time. 

## 2020-09-21 NOTE — Telephone Encounter (Signed)
-----   Message from Dietrich Pates V, MD sent at 09/20/2020 10:25 PM EDT ----- Normal sinus rhythm  Average HR is controlled 91 bpm Triggered events did not correlate with arrhythmi or with high heart rates Stay hydrated

## 2020-12-18 ENCOUNTER — Emergency Department (HOSPITAL_COMMUNITY)
Admission: EM | Admit: 2020-12-18 | Discharge: 2020-12-18 | Disposition: A | Discharge status: Home / Self Care | Payer: Medicaid Other | Attending: Emergency Medicine | Admitting: Emergency Medicine

## 2020-12-18 ENCOUNTER — Encounter (HOSPITAL_COMMUNITY): Payer: Self-pay | Admitting: Emergency Medicine

## 2020-12-18 DIAGNOSIS — I10 Essential (primary) hypertension: Secondary | ICD-10-CM | POA: Diagnosis not present

## 2020-12-18 DIAGNOSIS — J449 Chronic obstructive pulmonary disease, unspecified: Secondary | ICD-10-CM | POA: Diagnosis not present

## 2020-12-18 DIAGNOSIS — Z79899 Other long term (current) drug therapy: Secondary | ICD-10-CM | POA: Insufficient documentation

## 2020-12-18 DIAGNOSIS — S060X0A Concussion without loss of consciousness, initial encounter: Secondary | ICD-10-CM | POA: Diagnosis not present

## 2020-12-18 DIAGNOSIS — R519 Headache, unspecified: Secondary | ICD-10-CM | POA: Diagnosis present

## 2020-12-18 DIAGNOSIS — F17210 Nicotine dependence, cigarettes, uncomplicated: Secondary | ICD-10-CM | POA: Diagnosis not present

## 2020-12-18 DIAGNOSIS — Z8616 Personal history of COVID-19: Secondary | ICD-10-CM | POA: Insufficient documentation

## 2020-12-18 DIAGNOSIS — W01190A Fall on same level from slipping, tripping and stumbling with subsequent striking against furniture, initial encounter: Secondary | ICD-10-CM | POA: Insufficient documentation

## 2020-12-18 MED ORDER — KETOROLAC TROMETHAMINE 60 MG/2ML IM SOLN
60.0000 mg | Freq: Once | INTRAMUSCULAR | Status: AC
  Administered 2020-12-18: 60 mg via INTRAMUSCULAR
  Filled 2020-12-18: qty 2

## 2020-12-18 MED ORDER — ONDANSETRON 4 MG PO TBDP: 4.0000 mg | ORAL_TABLET | Freq: Three times a day (TID) | ORAL | 0 refills | Status: DC | PRN

## 2020-12-18 MED ORDER — NAPROXEN 500 MG PO TABS
500.0000 mg | ORAL_TABLET | Freq: Two times a day (BID) | ORAL | 0 refills | Status: DC
Start: 2020-12-18 — End: 2021-07-01

## 2020-12-18 NOTE — Discharge Instructions (Addendum)
Your symptoms are consistent with having a concussion.  Please read the attached instructions and return to the emergency department for worsening symptoms but expect to have some amount of headache dizziness and nausea, possibly lightheadedness as well.  Please take Naprosyn, 500mg  by mouth twice daily as needed for pain - this in an antiinflammatory medicine (NSAID) and is similar to ibuprofen - many people feel that it is stronger than ibuprofen and it is easier to take since it is a smaller pill.  Please use this only for 1 week - if your pain persists, you will need to follow up with your doctor in the office for ongoing guidance and pain control.     Zofran every 6 hours as needed for nasuea

## 2020-12-18 NOTE — ED Triage Notes (Signed)
Pt reports falling last night and hitting her head on her dresser. Pt reports the pain is "getting worse."
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
## 2020-12-20 ENCOUNTER — Other Ambulatory Visit: Payer: Self-pay

## 2020-12-20 ENCOUNTER — Emergency Department (HOSPITAL_COMMUNITY)
Admission: EM | Admit: 2020-12-20 | Discharge: 2020-12-20 | Disposition: A | Discharge status: Home / Self Care | Payer: Medicaid Other | Attending: Emergency Medicine | Admitting: Emergency Medicine

## 2020-12-20 ENCOUNTER — Encounter (HOSPITAL_COMMUNITY): Payer: Self-pay

## 2020-12-20 ENCOUNTER — Emergency Department (HOSPITAL_COMMUNITY): Payer: Medicaid Other

## 2020-12-20 DIAGNOSIS — S060X0D Concussion without loss of consciousness, subsequent encounter: Secondary | ICD-10-CM | POA: Diagnosis not present

## 2020-12-20 DIAGNOSIS — Z8616 Personal history of COVID-19: Secondary | ICD-10-CM | POA: Insufficient documentation

## 2020-12-20 DIAGNOSIS — Z79899 Other long term (current) drug therapy: Secondary | ICD-10-CM | POA: Insufficient documentation

## 2020-12-20 DIAGNOSIS — F17210 Nicotine dependence, cigarettes, uncomplicated: Secondary | ICD-10-CM | POA: Diagnosis not present

## 2020-12-20 DIAGNOSIS — W01198D Fall on same level from slipping, tripping and stumbling with subsequent striking against other object, subsequent encounter: Secondary | ICD-10-CM | POA: Insufficient documentation

## 2020-12-20 DIAGNOSIS — S0990XD Unspecified injury of head, subsequent encounter: Secondary | ICD-10-CM | POA: Diagnosis present

## 2020-12-20 DIAGNOSIS — J449 Chronic obstructive pulmonary disease, unspecified: Secondary | ICD-10-CM | POA: Diagnosis not present

## 2020-12-20 DIAGNOSIS — I10 Essential (primary) hypertension: Secondary | ICD-10-CM | POA: Insufficient documentation

## 2020-12-20 MED ORDER — HYDROCODONE-ACETAMINOPHEN 5-325 MG PO TABS
1.0000 | ORAL_TABLET | Freq: Once | ORAL | Status: AC
  Administered 2020-12-20: 1 via ORAL
  Filled 2020-12-20: qty 1
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
## 2020-12-20 NOTE — ED Notes (Signed)
Pt returns for headache complaint after fall on 10/2 which she came to this ED to be evaluated for. Pt reports headache without relief and wanting further evaluation. Pt denies LOC or being on blood thinners. Pt is alert and oriented x 4

## 2020-12-20 NOTE — Discharge Instructions (Addendum)
The CAT scan does not show any serious problems.  You still appear to have had a concussion when you fell and hit your head.  For pain use Tylenol or Motrin.  Avoid bright lights and loud noises.  Stay quiet and get plenty of rest.  See your doctor for problems.

## 2020-12-20 NOTE — ED Triage Notes (Signed)
Pt. States they were seen last night for a concussion. Pt. States their headache is getting worse. Pt states they hurt their head on 12/18/2020.

## 2021-02-20 ENCOUNTER — Encounter (HOSPITAL_COMMUNITY): Payer: Self-pay | Admitting: *Deleted

## 2021-02-20 ENCOUNTER — Emergency Department (HOSPITAL_COMMUNITY)
Admission: EM | Admit: 2021-02-20 | Discharge: 2021-02-20 | Disposition: A | Discharge status: Home / Self Care | Payer: Medicaid Other | Attending: Emergency Medicine | Admitting: Emergency Medicine

## 2021-02-20 DIAGNOSIS — R059 Cough, unspecified: Secondary | ICD-10-CM | POA: Insufficient documentation

## 2021-02-20 DIAGNOSIS — M25562 Pain in left knee: Secondary | ICD-10-CM | POA: Diagnosis not present

## 2021-02-20 DIAGNOSIS — Z5321 Procedure and treatment not carried out due to patient leaving prior to being seen by health care provider: Secondary | ICD-10-CM | POA: Insufficient documentation

## 2021-02-20 NOTE — ED Notes (Signed)
Reported that pt left after triage.

## 2021-02-20 NOTE — ED Triage Notes (Signed)
Left  knee pain for 5 months

## 2021-06-04 ENCOUNTER — Emergency Department (HOSPITAL_COMMUNITY)
Admission: EM | Admit: 2021-06-04 | Discharge: 2021-06-05 | Disposition: A | Discharge status: Home / Self Care | Payer: Medicaid Other | Attending: Emergency Medicine | Admitting: Emergency Medicine

## 2021-06-04 ENCOUNTER — Encounter (HOSPITAL_COMMUNITY): Payer: Self-pay | Admitting: Emergency Medicine

## 2021-06-04 DIAGNOSIS — R7401 Elevation of levels of liver transaminase levels: Secondary | ICD-10-CM | POA: Insufficient documentation

## 2021-06-04 DIAGNOSIS — F17210 Nicotine dependence, cigarettes, uncomplicated: Secondary | ICD-10-CM | POA: Diagnosis not present

## 2021-06-04 DIAGNOSIS — F1024 Alcohol dependence with alcohol-induced mood disorder: Secondary | ICD-10-CM | POA: Insufficient documentation

## 2021-06-04 DIAGNOSIS — F1020 Alcohol dependence, uncomplicated: Secondary | ICD-10-CM | POA: Diagnosis not present

## 2021-06-04 DIAGNOSIS — R45851 Suicidal ideations: Secondary | ICD-10-CM | POA: Diagnosis not present

## 2021-06-04 DIAGNOSIS — I10 Essential (primary) hypertension: Secondary | ICD-10-CM | POA: Diagnosis not present

## 2021-06-04 DIAGNOSIS — E871 Hypo-osmolality and hyponatremia: Secondary | ICD-10-CM | POA: Insufficient documentation

## 2021-06-04 DIAGNOSIS — Y908 Blood alcohol level of 240 mg/100 ml or more: Secondary | ICD-10-CM | POA: Insufficient documentation

## 2021-06-04 DIAGNOSIS — J449 Chronic obstructive pulmonary disease, unspecified: Secondary | ICD-10-CM | POA: Diagnosis not present

## 2021-06-04 DIAGNOSIS — K766 Portal hypertension: Secondary | ICD-10-CM | POA: Diagnosis not present

## 2021-06-04 DIAGNOSIS — R739 Hyperglycemia, unspecified: Secondary | ICD-10-CM | POA: Insufficient documentation

## 2021-06-04 DIAGNOSIS — F32A Depression, unspecified: Secondary | ICD-10-CM | POA: Diagnosis present

## 2021-06-04 DIAGNOSIS — Z79899 Other long term (current) drug therapy: Secondary | ICD-10-CM | POA: Diagnosis not present

## 2021-06-04 LAB — COMPREHENSIVE METABOLIC PANEL
ALT: 49 U/L — ABNORMAL HIGH (ref 0–44)
AST: 75 U/L — ABNORMAL HIGH (ref 15–41)
Albumin: 3.7 g/dL (ref 3.5–5.0)
Alkaline Phosphatase: 103 U/L (ref 38–126)
Anion gap: 11 (ref 5–15)
BUN: 6 mg/dL (ref 6–20)
CO2: 21 mmol/L — ABNORMAL LOW (ref 22–32)
Calcium: 8.4 mg/dL — ABNORMAL LOW (ref 8.9–10.3)
Chloride: 108 mmol/L (ref 98–111)
Creatinine, Ser: 0.52 mg/dL (ref 0.44–1.00)
GFR, Estimated: >60 mL/min (ref >60)
Glucose, Bld: 121 mg/dL — ABNORMAL HIGH (ref 70–99)
Potassium: 3.3 mmol/L — ABNORMAL LOW (ref 3.5–5.1)
Sodium: 140 mmol/L (ref 135–145)
Total Bilirubin: 0.5 mg/dL (ref 0.3–1.2)
Total Protein: 6.9 g/dL (ref 6.5–8.1)

## 2021-06-04 LAB — CBC WITH DIFFERENTIAL/PLATELET
Abs Immature Granulocytes: 0.02 10*3/uL (ref 0.00–0.07)
Basophils Absolute: 0.1 10*3/uL (ref 0.0–0.1)
Basophils Relative: 1 %
Eosinophils Absolute: 0 10*3/uL (ref 0.0–0.5)
Eosinophils Relative: 0 %
HCT: 35.6 % — ABNORMAL LOW (ref 36.0–46.0)
Hemoglobin: 11.9 g/dL — ABNORMAL LOW (ref 12.0–15.0)
Immature Granulocytes: 0 %
Lymphocytes Relative: 59 %
Lymphs Abs: 3.8 10*3/uL (ref 0.7–4.0)
MCH: 32.4 pg (ref 26.0–34.0)
MCHC: 33.4 g/dL (ref 30.0–36.0)
MCV: 97 fL (ref 80.0–100.0)
Monocytes Absolute: 0.4 10*3/uL (ref 0.1–1.0)
Monocytes Relative: 6 %
Neutro Abs: 2.2 10*3/uL (ref 1.7–7.7)
Neutrophils Relative %: 34 %
Platelets: 236 10*3/uL (ref 150–400)
RBC: 3.67 MIL/uL — ABNORMAL LOW (ref 3.87–5.11)
RDW: 17.1 % — ABNORMAL HIGH (ref 11.5–15.5)
WBC: 6.4 10*3/uL (ref 4.0–10.5)
nRBC: 0.3 % — ABNORMAL HIGH (ref 0.0–0.2)

## 2021-06-04 LAB — RAPID URINE DRUG SCREEN, HOSP PERFORMED
Amphetamines: NOT DETECTED
Barbiturates: NOT DETECTED
Benzodiazepines: POSITIVE — AB
Cocaine: NOT DETECTED
Opiates: NOT DETECTED
Tetrahydrocannabinol: NOT DETECTED

## 2021-06-04 LAB — SALICYLATE LEVEL: Salicylate Lvl: <7 mg/dL — ABNORMAL LOW (ref 7.0–30.0)

## 2021-06-04 LAB — ETHANOL: Alcohol, Ethyl (B): 345 mg/dL (ref <10)

## 2021-06-04 LAB — ACETAMINOPHEN LEVEL: Acetaminophen (Tylenol), Serum: <10 ug/mL — ABNORMAL LOW (ref 10–30)

## 2021-06-04 LAB — PREGNANCY, URINE: Preg Test, Ur: NEGATIVE

## 2021-06-04 MED ORDER — LORAZEPAM 2 MG/ML IJ SOLN: 0.0000 mg | Freq: Two times a day (BID) | INTRAMUSCULAR | Status: DC

## 2021-06-04 MED ORDER — THIAMINE HCL 100 MG/ML IJ SOLN: 100.0000 mg | Freq: Every day | INTRAMUSCULAR | Status: DC

## 2021-06-04 MED ORDER — LORAZEPAM 1 MG PO TABS: 0.0000 mg | ORAL_TABLET | Freq: Two times a day (BID) | ORAL | Status: DC

## 2021-06-04 MED ORDER — LORAZEPAM 1 MG PO TABS
0.0000 mg | ORAL_TABLET | Freq: Four times a day (QID) | ORAL | Status: DC
  Administered 2021-06-05 (×2): 1 mg via ORAL
  Filled 2021-06-04 (×2): qty 1

## 2021-06-04 MED ORDER — THIAMINE HCL 100 MG PO TABS
100.0000 mg | ORAL_TABLET | Freq: Every day | ORAL | Status: DC
  Administered 2021-06-04 – 2021-06-05 (×2): 100 mg via ORAL
  Filled 2021-06-04 (×2): qty 1

## 2021-06-04 MED ORDER — LORAZEPAM 2 MG/ML IJ SOLN: 0.0000 mg | Freq: Four times a day (QID) | INTRAMUSCULAR | Status: DC
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
## 2021-06-04 NOTE — ED Notes (Signed)
Pt ambulated to BR without difficultly, instructed to get urine sample ?
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
## 2021-06-04 NOTE — ED Notes (Signed)
Mother called and contact number given  ?[place] Hunt ?[phone] ?

## 2021-06-04 NOTE — ED Notes (Signed)
ED Provider at bedside. 

## 2021-06-04 NOTE — ED Provider Notes (Signed)
?[facility] ?Provider Note ? ? ?CSN: [id number] ?Arrival date & time: 06/04/21  1704 ? ?  ? ?History ? ?Chief Complaint  ?Patient presents with  ? Suicidal  ? ? ?Hannah Rhodes is a 41 y.o. female with a history including anxiety, insomnia, chronic alcoholism stating she drinks daily who drink a liter of liquor earlier today and who endorses she is clearly intoxicated at this time, presenting for evaluation of worsening depression which has become suicidal ideation.  She reports her sister died in February as the trigger for her current mental status.  Today she started having thoughts of leaving her home and walking out in front of a car.  Her mother stopped her from doing this.  The police were called and she was escorted here, she does endorse being here voluntarily and states that she wants help with her depression.  She denies any physical complaints at this time.  She does smoke cigarettes, currently 5 to 6 cigarettes/day as she is trying to stop smoking.  She denies drug abuse.  She has had her blood pressure medications today, denies any other medications.  She does endorse alcohol withdrawal symptoms if she goes more than 2 days without alcohol. ? ?The history is provided by the patient.  ? ?  ? ?Home Medications ?Prior to Admission medications   ?Medication Sig Start Date End Date Taking? Authorizing Provider  ?albuterol (VENTOLIN HFA) 108 (90 Base) MCG/ACT inhaler Inhale 1-2 puffs into the lungs every 6 (six) hours as needed for wheezing or shortness of breath. 09/12/20   Pricilla Riffleoss, Paula V, MD  ?amLODipine (NORVASC) 10 MG tablet Take 10 mg by mouth daily.    [provider]  ?folic acid (FOLVITE) 1 MG tablet Take 1 mg by mouth daily.    [provider]  ?gabapentin (NEURONTIN) 100 MG capsule Take 100 mg by mouth 3 (three) times daily as needed (pain).  ?Patient not taking: No sig reported 06/23/19   [provider]  ?Magnesium Oxide 400 MG CAPS Take 1 capsule (400 mg  total) by mouth daily. 09/12/20   Pricilla Riffleoss, Paula V, MD  ?melatonin 5 MG TABS Take 5 mg by mouth at bedtime as needed (sleep).    [provider]  ?metoprolol tartrate (LOPRESSOR) 50 MG tablet Take 100 mg by mouth 2 (two) times daily. 100 mg BID    [provider]  ?naproxen (NAPROSYN) 500 MG tablet Take 1 tablet (500 mg total) by mouth 2 (two) times daily with a meal. 12/18/20   Eber HongMiller, Brian, MD  ?nicotine (NICODERM CQ - DOSED IN MG/24 HOURS) 21 mg/24hr patch Place 21 mg onto the skin daily. ?Patient not taking: Reported on 09/09/2020    [provider]  ?ondansetron (ZOFRAN ODT) 4 MG disintegrating tablet Take 1 tablet (4 mg total) by mouth every 8 (eight) hours as needed for nausea. 12/18/20   Eber HongMiller, Brian, MD  ?Potassium Chloride ER 20 MEQ TBCR Take 20 mEq by mouth daily.    [provider]  ?   ? ?Allergies    ?Adhesive [tape]   ? ?Review of Systems   ?Review of Systems  ?Constitutional:  Negative for chills and fever.  ?HENT: Negative.    ?Eyes: Negative.   ?Respiratory:  Negative for chest tightness and shortness of breath.   ?Cardiovascular:  Negative for chest pain.  ?Gastrointestinal:  Negative for abdominal pain, nausea and vomiting.  ?Genitourinary: Negative.   ?Musculoskeletal:  Negative for arthralgias, joint swelling and neck  pain.  ?Skin: Negative.  Negative for rash and wound.  ?Neurological:  Negative for weakness, light-headedness, numbness and headaches.  ?Psychiatric/Behavioral:  Positive for suicidal ideas.   ? ?Physical Exam ?Updated Vital Signs ?BP (!) 128/102 (BP Location: Right Arm)   Pulse (!) 114   Temp 97.8 ?F (36.6 ?C) (Oral)   Resp 17   Ht 4\' 10"  (1.473 m)   Wt 42.6 kg   LMP 05/18/2021   SpO2 100%   BMI 19.65 kg/m?  ?Physical Exam ?Vitals and nursing note reviewed.  ?Constitutional:   ?   Appearance: She is well-developed.  ?HENT:  ?   Head: Normocephalic and atraumatic.  ?Eyes:  ?   Conjunctiva/sclera: Conjunctivae normal.  ?Cardiovascular:  ?    Rate and Rhythm: Normal rate and regular rhythm.  ?   Heart sounds: Normal heart sounds.  ?Pulmonary:  ?   Effort: Pulmonary effort is normal.  ?   Breath sounds: Normal breath sounds. No wheezing or rhonchi.  ?Abdominal:  ?   General: Bowel sounds are normal.  ?   Palpations: Abdomen is soft.  ?   Tenderness: There is no abdominal tenderness. There is no guarding or rebound.  ?Musculoskeletal:     ?   General: Normal range of motion.  ?   Cervical back: Normal range of motion.  ?Skin: ?   General: Skin is warm and dry.  ?Neurological:  ?   General: No focal deficit present.  ?   Mental Status: She is alert and oriented to person, place, and time.  ?   Cranial Nerves: No cranial nerve deficit.  ?   Sensory: No sensory deficit.  ?   Motor: No weakness.  ?Psychiatric:     ?   Attention and Perception: Attention normal.     ?   Mood and Affect: Mood is depressed. Affect is flat and tearful.     ?   Speech: Speech normal.     ?   Behavior: Behavior normal. Behavior is cooperative.     ?   Thought Content: Thought content includes suicidal ideation. Thought content includes suicidal plan.  ? ? ?ED Results / Procedures / Treatments   ?Labs ?(all labs ordered are listed, but only abnormal results are displayed) ?Labs Reviewed  ?COMPREHENSIVE METABOLIC PANEL - Abnormal; Notable for the following components:  ?    Result Value  ? Potassium 3.3 (*)   ? CO2 21 (*)   ? Glucose, Bld 121 (*)   ? Calcium 8.4 (*)   ? AST 75 (*)   ? ALT 49 (*)   ? All other components within normal limits  ?ETHANOL - Abnormal; Notable for the following components:  ? Alcohol, Ethyl (B) 345 (*)   ? All other components within normal limits  ?CBC WITH DIFFERENTIAL/PLATELET - Abnormal; Notable for the following components:  ? RBC 3.67 (*)   ? Hemoglobin 11.9 (*)   ? HCT 35.6 (*)   ? RDW 17.1 (*)   ? nRBC 0.3 (*)   ? All other components within normal limits  ?SALICYLATE LEVEL - Abnormal; Notable for the following components:  ? Salicylate Lvl <7.0 (*)    ? All other components within normal limits  ?ACETAMINOPHEN LEVEL - Abnormal; Notable for the following components:  ? Acetaminophen (Tylenol), Serum <10 (*)   ? All other components within normal limits  ?RESP PANEL BY RT-PCR (FLU A&B, COVID) ARPGX2  ?RAPID URINE DRUG SCREEN, HOSP PERFORMED  ?PREGNANCY, URINE  ? ? ?  EKG ?EKG Interpretation ? ?Date/Time:  Sunday June 04 2021 18:04:18 EDT ?Ventricular Rate:  105 ?PR Interval:  131 ?QRS Duration: 77 ?QT Interval:  367 ?QTC Calculation: 485 ?R Axis:   -5 ?Text Interpretation: Sinus tachycardia Ventricular premature complex Aberrant complex Probable left atrial enlargement Low voltage, extremity and precordial leads Nonspecific T abnrm, anterolateral leads Since last tracing rate faster Confirmed by Eber Hong ([id number]) on 06/04/2021 6:23:40 PM ? ?Radiology ?No results found. ? ?Procedures ?Procedures  ? ? ?Medications Ordered in ED ?Medications - No data to display ? ?ED Course/ Medical Decision Making/ A&P ?  ?                        ?Medical Decision Making ?Patient who is currently intoxicated but with escalating depression in suicidality, now with the plan, triggered by the death of her sister last month.  Patient is here voluntarily.  Currently pending medical clearance with anticipation of TTS evaluation. ? ?Patient signed out to [employer] who assumes care pending medical clearance.  Patient will need psych holding orders including CIWA protocol. ? ?Amount and/or Complexity of Data Reviewed ?Labs: ordered. ? ? ? ? ? ? ? ? ? ? ?Final Clinical Impression(s) / ED Diagnoses ?Final diagnoses:  ?None  ? ? ?Rx / DC Orders ?ED Discharge Orders   ? ? None  ? ?  ? ? ?  ?Burgess Amor, PA-C ?06/04/21 1905 ? ?  ?Eber Hong, MD ?06/12/21 [phone] ? ?

## 2021-06-04 NOTE — ED Notes (Signed)
Patient changed into scrubs and belongings placed in ED storage. Patient wanded at this time.  ?

## 2021-06-04 NOTE — ED Notes (Signed)
Pt admits to SI, states her sister recently passed away back in 2022/05/19 due to OD on heroin.  ?

## 2021-06-05 DIAGNOSIS — K766 Portal hypertension: Secondary | ICD-10-CM

## 2021-06-05 DIAGNOSIS — F1020 Alcohol dependence, uncomplicated: Secondary | ICD-10-CM

## 2021-06-05 NOTE — Discharge Instructions (Signed)
Patient has been recommended to secure services via outpatient to treat mental illness and substance dependency. Patient has been asked to contact one of the providers listed below:  ? ?[facility] ?[address] [place], [place] [address] ?[phone] ?  ?[facility] ?[address] [place], [place] [address] ?[phone] ? ?[facility] [place], [place] [address] ?[phone] ?

## 2021-06-05 NOTE — Consult Note (Signed)
Telepsych Consultation  ? ?Reason for Consult:  Psych Clearance ?Referring Physician:  Dr. Audley Hose ?Location of Patient: [facility] ?Location of Provider: [facility] TTS Department ? ?Patient Identification: Hannah Rhodes ?MRN:  [id number] ?Principal Diagnosis: <principal problem not specified> ?Diagnosis:  Active Problems: ?  Portal hypertension (HCC) ? ?Total Time spent with patient: 45 minutes ? ?Subjective:  "My mother would not let me leave the house and I got into an argument with me. I had to force myself out of the house and went and got into my son's car." ?  ?HPI:  Hannah Rhodes is a 41 y.o. female patient who presents voluntarily to [facility] for worsening depression and suicidal ideation. Patient admitted with a history of anxiety, chronic alcoholism, insomnia. Patient lives at home occasionally with her mother.   ? ?Patient reported that she had an altercation with her mother because she would not let her out of the house. She forced her self out and attempted to go into her son's car and the mother and her sister grabbed her and called the police department who took her to the hospital. Patient denied running in front of the car and stated, "What they were saying all lies that she ran in front of a running car." Patient admitted to drinking about 1 L of liquor earlier and was intoxicated. Patient endorses alcohol withdrawal symptoms if she goes more than 2 days without alcohol. She reported increased drinking this time because her sister died one month ago. Upon chart review, blood alcohol level was 345 and is on CIWA protocol, ALT was 49 and AST 75, and Potasium level 3.3. Patient endorsed sleeping up to 6 hours last night, and consuming 100% of her meals. She denied SI/HI/AVH. Stated she is safe at home and denied access to firearms. ? ?On encounter today, patient is assessed via TeleHealth sitting on her hospital bed. Chart is reviewed and findings shared with the treatment team and discussed  with Dr. Lucianne Muss. Patient is alert and oriented to person, place, time and situation. Speech is fluent, clear with normal pattern and volume. Good eye contact maintained and able to answer questions appropriately. Mood is anxious and affect is congruent and appropriate. Thought process is congruent and linear and thought content is within normal limit. Psychomotor activity normal. ? ?From my assessment today, there is no evidence that patient is of imminent risk to self or others at this present time.  Patient does not meet criteria for psychiatric inpatient admission. Supportive therapy provided about ongoing stressors. Discussed crisis plan, support from social network for resources for alcohol treatment therapy, calling 911, coming to the Emergency Department, and calling Suicide Hotline. ? ?Past Psychiatric History: Alcoholism, Anxiety, panic attack, polysubstance abuse ? ?Risk to Self:  no ?Risk to Others:  no ?Prior Inpatient Therapy: no  ?Prior Outpatient Therapy:  no ? ?Past Medical History:  ?Past Medical History:  ?Diagnosis Date  ? Alcoholism (HCC)   ? Anxiety   ? Chronic hepatitis C (HCC)   ? COPD (chronic obstructive pulmonary disease) (HCC)   ? no inaler  ? DDD (degenerative disc disease), lumbar   ? Fatty liver   ? Generalized headaches   ? GERD (gastroesophageal reflux disease)   ? diet controlled, no med  ? Heroin use   ? HPV (human papilloma virus) infection   ? Hypertension   ? Insomnia   ? Panic attack   ? Pneumonia 03/2010  ? Smoker   ? Tachycardia   ?  ?  Past Surgical History:  ?Procedure Laterality Date  ? BIOPSY  09/28/2019  ? Procedure: BIOPSY;  Surgeon: Lemar LoftyMansouraty, Gabriel Jr., MD;  [facility];  Service: Gastroenterology;;  ? CESAREAN SECTION    ? CESAREAN SECTION    ? ESOPHAGOGASTRODUODENOSCOPY (EGD) WITH PROPOFOL N/A 09/28/2019  ? Procedure: ESOPHAGOGASTRODUODENOSCOPY (EGD) WITH PROPOFOL;  Surgeon: Meridee ScoreMansouraty, Netty StarringGabriel Jr., MD;  [facility];  Service: Gastroenterology;   Laterality: N/A;  ? EUS  09/28/2019  ? Procedure: UPPER ENDOSCOPIC ULTRASOUND (EUS) LINEAR;  Surgeon: Lemar LoftyMansouraty, Gabriel Jr., MD;  [facility];  Service: Gastroenterology;;  ? EUS  09/28/2019  ? Procedure: FULL UPPER ENDOSCOPIC ULTRASOUND (EUS) RADIAL;  Surgeon: Meridee ScoreMansouraty, Netty StarringGabriel Jr., MD;  [facility];  Service: Gastroenterology;;  ? FINE NEEDLE ASPIRATION  09/28/2019  ? Procedure: FINE NEEDLE ASPIRATION (FNA) LINEAR;  Surgeon: Lemar LoftyMansouraty, Gabriel Jr., MD;  [facility];  Service: Gastroenterology;;  ? SMALL INTESTINE SURGERY    ? WISDOM TOOTH EXTRACTION    ? ?Family History:  ?Family History  ?Problem Relation Age of Onset  ? Anxiety disorder Sister   ? Restless legs syndrome Sister   ? Heart failure Mother   ? CAD Mother   ? Heart attack Mother   ? Colon cancer Neg Hx   ? Esophageal cancer Neg Hx   ? Inflammatory bowel disease Neg Hx   ? Liver disease Neg Hx   ? Pancreatic cancer Neg Hx   ? Stomach cancer Neg Hx   ? Rectal cancer Neg Hx   ? ?Family Psychiatric  History: Anxiety disorder - sister ?Social History:  ?Social History  ? ?Substance and Sexual Activity  ?Alcohol Use Yes  ? Comment: everyday. drinks whatever she can get her hands on  ?   ?Social History  ? ?Substance and Sexual Activity  ?Drug Use Not Currently  ? Types: Marijuana, Heroin, Oxycodone, Hydrocodone, Cocaine  ?  ?Social History  ? ?Socioeconomic History  ? Marital status: Single  ?  Spouse name: Not on file  ? Number of children: 7  ? Years of education: Not on file  ? Highest education level: Not on file  ?Occupational History  ? Occupation: Homemaker  ?Tobacco Use  ? Smoking status: Every Day  ?  Packs/day: 0.50  ?  Years: 24.00  ?  Pack years: 12.00  ?  Types: Cigarettes  ? Smokeless tobacco: Never  ? Tobacco comments:  ?  Cut back to 5 cig/day  ?Vaping Use  ? Vaping Use: Never used  ?Substance and Sexual Activity  ? Alcohol use: Yes  ?  Comment: everyday. drinks whatever she can get her hands on  ? Drug  use: Not Currently  ?  Types: Marijuana, Heroin, Oxycodone, Hydrocodone, Cocaine  ? Sexual activity: Not Currently  ?Other Topics Concern  ? Not on file  ?Social History Narrative  ? Not on file  ? ?Social Determinants of Health  ? ?Financial Resource Strain: Not on file  ?Food Insecurity: Not on file  ?Transportation Needs: Not on file  ?Physical Activity: Not on file  ?Stress: Not on file  ?Social Connections: Not on file  ? ?Additional Social History: ?  ? ?Allergies:   ?Allergies  ?Allergen Reactions  ? Adhesive [Tape] Rash  ?  Tears skin ?Ok with paper tape  ? ? ?Labs:  ?Results for orders placed or performed during the hospital encounter of 06/04/21 (from the past 48 hour(s))  ?Comprehensive metabolic panel     Status: Abnormal  ? Collection  Time: 06/04/21  5:54 PM  ?Result Value Ref Range  ? Sodium 140 135 - 145 mmol/L  ? Potassium 3.3 (L) 3.5 - 5.1 mmol/L  ? Chloride 108 98 - 111 mmol/L  ? CO2 21 (L) 22 - 32 mmol/L  ? Glucose, Bld 121 (H) 70 - 99 mg/dL  ?  Comment: Glucose reference range applies only to samples taken after fasting for at least 8 hours.  ? BUN 6 6 - 20 mg/dL  ? Creatinine, Ser 0.52 0.44 - 1.00 mg/dL  ? Calcium 8.4 (L) 8.9 - 10.3 mg/dL  ? Total Protein 6.9 6.5 - 8.1 g/dL  ? Albumin 3.7 3.5 - 5.0 g/dL  ? AST 75 (H) 15 - 41 U/L  ? ALT 49 (H) 0 - 44 U/L  ? Alkaline Phosphatase 103 38 - 126 U/L  ? Total Bilirubin 0.5 0.3 - 1.2 mg/dL  ? GFR, Estimated >60 >60 mL/min  ?  Comment: (NOTE) ?Calculated using the CKD-EPI Creatinine Equation (2021) ?  ? Anion gap 11 5 - 15  ?  Comment: Performed at [facility], [address] [place], [place] [address]  ?Ethanol     Status: Abnormal  ? Collection Time: 06/04/21  5:54 PM  ?Result Value Ref Range  ? Alcohol, Ethyl (B) 345 (HH) <10 mg/dL  ?  Comment: CRITICAL RESULT CALLED TO, READ BACK BY AND VERIFIED WITH: ?B GALLOWAY AT 1903 ON 06/04/2021 BY MOSLEY,J  ?(NOTE) ?Lowest detectable limit for serum alcohol is 10 mg/dL. ? ?For medical purposes only. ?Performed  at [facility], [address], [place], [place] [address] ?  ?Urine rapid drug screen (hosp performed)     Status: Abnormal  ? Collection Time: 06/04/21  5:54 PM  ?Result Value Ref Range  ? Opiates NON

## 2021-06-05 NOTE — ED Notes (Signed)
TTS completed on pt. ?Pt resting comfortably at this time ?

## 2021-06-05 NOTE — BH Assessment (Signed)
Comprehensive Clinical Assessment (CCA) Note ? ?06/05/2021 ?Hannah Rhodes ?[id number] ? ?Hannah Bering, NP, reviewed pt's chart and information and determined pt should receive continuous assessment at [facility] and be re-assessed by psychiatry in the morning to determine what treatment options might be available for pt. This information was relayed to pt's team at 0558. ? 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 Acute risk factors for suicide include: family or marital conflict, unemployment, and loss (financial, interpersonal, professional). Protective factors for this patient include: positive social support, responsibility to others (children, family), and hope for the future. Considering these factors, the overall suicide risk at this point appears to be high. Patient is not appropriate for outpatient follow up. ? ?Therefore, a 1:1 sitter is recommended for suicide precautions. ? ?Flowsheet Row ED from 06/04/2021 in [facility] EMERGENCY DEPARTMENT ED from 02/20/2021 in [facility] EMERGENCY DEPARTMENT ED from 12/20/2020 in [facility] EMERGENCY DEPARTMENT  ?C-SSRS RISK CATEGORY High Risk No Risk No Risk  ? ?  ?Chief Complaint:  ?Chief Complaint  ?Patient presents with  ? Suicidal  ? Alcohol Problem  ? ?Visit Diagnosis: Alcohol-Induced Depressive Disorder ? ?CCA Screening, Triage and Referral (STR) ?Hannah Rhodes is a 41 year old patient who was brought to the [facility] via the [facility]; per PA-C's note, "Today she started having thoughts of leaving her home and walking out in front of a car. Her mother stopped her from doing this." ? ?Pt states, "my mom and I got into an argument and she wouldn't let me out of the house and to get her to let me out I told her I was going to kill myself." Pt denies SI or a hx of SI. She denies any prior attempts or a plan to kill herself. She denies  she's ever been hospitalized for mental health concerns. Pt denies HI, AVH, NSSIB, access to guns/weapons, and engagement with the legal system.  ? ?Pt states she engages in the consumption of 2 shots of liquor 2x/week. However, per RN note: "Having suicidal thoughts that started today. Pt states she attempted to leave her house and wanted to walk out in front of a car but her mother stopped her. Drinks etoh everyday, drank 1 pint of liquor today." Also, per her PA-C: "She does endorse alcohol withdrawal symptoms if she goes more than 2 days without alcohol." ? ?Of note, pt's sister passed away in May 17, 2021; per the RN note, "her sister recently passed away back in 05/17/2022 due to OD on heroin." ? ?Pt is oriented x5. Her recent/remote memory is intact. Pt was guarded throughout the assessment process. Pt's insight, judgement, and impulse control is poor at this time. ? ?Patient Reported Information ?How did you hear about [place]? Family/Friend ? ?What Is the Reason for Your Visit/Call Today? Pt states, "my mom and I got into an argument and she wouldn't let me out of the house and to get her to let me out I told her I was going to kill myself." Pt denies SI or a hx of SI. She denies any prior attempts or a plan to kill herself. She denies she's ever been hospitalized for mental health concerns. Pt denies HI, AVH, NSSIB, access to guns/weapons, and engagement with the legal system. Pt states she engages in the consumption of 2 shots of liquor 2x/week. However, per RN note: "Having suicidal thoughts that started today. Pt states she attempted to  leave her house and wanted to walk out in front of a car but her mother stopped her. Drinks etoh everyday, drank 1 pint of liquor today." Also, per her PA-C: "She does endorse alcohol withdrawal symptoms if she goes more than 2 days without alcohol." ? ?How Long Has This Been Causing You Problems? > than 6 months ? ?What Do You Feel Would Help You the Most Today? Treatment for  Depression or other mood problem; Medication(s); Alcohol or Drug Use Treatment ? ? ?Have You Recently Had Any Thoughts About Hurting Yourself? -- (Pt denies; triage note states differently) ? ?Are You Planning to Commit Suicide/Harm Yourself At This time? -- (Pt denies; triage note states differently) ? ? ?Have you Recently Had Thoughts About Hurting Someone Karolee Ohs? No ? ?Are You Planning to Harm Someone at This Time? No ? ?Explanation: No data recorded ? ?Have You Used Any Alcohol or Drugs in the Past 24 Hours? Yes ? ?How Long Ago Did You Use Drugs or Alcohol? No data recorded ?What Did You Use and How Much? Per notes, pt drank a pint of liquor 06/04/2021 ? ? ?Do You Currently Have a Therapist/Psychiatrist? No ? ?Name of Therapist/Psychiatrist: No data recorded ? ?Have You Been Recently Discharged From Any Office Practice or Programs? No ? ?Explanation of Discharge From Practice/Program: No data recorded ? ?  ?CCA Screening Triage Referral Assessment ?Type of Contact: Tele-Assessment ? ?Telemedicine Service Delivery: Telemedicine service delivery: This service was provided via telemedicine using a 2-way, interactive audio and video technology ? ?Is this Initial or Reassessment? Initial Assessment ? ?Date Telepsych consult ordered in [facility]:  06/04/21 ? ?Time Telepsych consult ordered in [facility]:  1921 ? ?Location of Assessment: [facility] ? ?Provider Location: [facility] Services ? ? ?Collateral Involvement: None at this time ? ? ?Does Patient Have a Automotive engineer Guardian? No data recorded ?Name and Contact of Legal Guardian: No data recorded ?If Minor and Not Living with Parent(s), Who has Custody? N/A ? ?Is CPS involved or ever been involved? Never ? ?Is APS involved or ever been involved? Never ? ? ?Patient Determined To Be At Risk for Harm To Self or Others Based on Review of Patient Reported Information or Presenting Complaint? Yes, for Self-Harm ? ?Method: No data recorded ?Availability of Means: No data  recorded ?Intent: No data recorded ?Notification Required: No data recorded ?Additional Information for Danger to Others Potential: No data recorded ?Additional Comments for Danger to Others Potential: No data recorded ?Are There Guns or Other Weapons in Your Home? No data recorded ?Types of Guns/Weapons: No data recorded ?Are These Weapons Safely Secured?                            No data recorded ?Who Could Verify You Are Able To Have These Secured: No data recorded ?Do You Have any Outstanding Charges, Pending Court Dates, Parole/Probation? 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 ? ? ?Does Patient Present under Involuntary Commitment? No ? ?IVC Papers Initial File Date: No data recorded ? ?[place] of Residence: [place] ? ? ?Patient Currently Receiving the Following Services: Not Receiving Services ? ? ?Determination of Need: Urgent (48 hours) ? ? ?Options For Referral: Outpatient Therapy; Medication Management; Other: Comment (Continuous Assessment at [facility]) ? ? ? ? ?CCA Biopsychosocial ?Patient Reported Schizophrenia/Schizoaffective Diagnosis in Past: No ? ? ?  Strengths: Pt shares she has been attempting to obtain services for her mental health needs. ? ? ?Mental Health Symptoms ?Depression:   ?Irritability; Difficulty Concentrating; Increase/decrease in appetite; Change in energy/activity ?  ?Duration of Depressive symptoms:  ?Duration of Depressive Symptoms: Greater than two weeks ?  ?Mania:   ?None ?  ?Anxiety:    ?Worrying ?  ?Psychosis:   ?None ?  ?Duration of Psychotic symptoms:    ?Trauma:   ?Avoids reminders of event ?  ?Obsessions:   ?None ?  ?Compulsions:   ?None ?  ?Inattention:   ?None ?  ?Hyperactivity/Impulsivity:   ?None ?  ?Oppositional/Defiant Behaviors:   ?None ?  ?Emotional Irregularity:   ?Mood lability; Potentially harmful impulsivity; Recurrent suicidal behaviors/gestures/threats ?   ?Other Mood/Personality Symptoms:   ?None noted ?  ? ?Mental Status Exam ?Appearance and self-care  ?Stature:   ?Average ?  ?Weight:   ?Average weight ?  ?Clothing:   ?-- (Pt is dressed in hospital scrubs)

## 2021-06-05 NOTE — ED Notes (Signed)
Pt given a drink and more blankets. Pt is calm and cooperative. Denies any pain or N/V.  ?

## 2021-06-05 NOTE — ED Provider Notes (Signed)
Patient seen by psychiatry team.  Cleared for continued outpatient care.  Advised return for any worsening symptoms or additional concerns. ?  ?Luna Fuse, MD ?06/05/21 1203 ? ?

## 2021-06-28 ENCOUNTER — Encounter: Payer: Self-pay | Admitting: *Deleted

## 2021-07-01 ENCOUNTER — Emergency Department (HOSPITAL_COMMUNITY): Payer: Medicaid Other

## 2021-07-01 ENCOUNTER — Other Ambulatory Visit: Payer: Self-pay

## 2021-07-01 ENCOUNTER — Encounter (HOSPITAL_COMMUNITY): Payer: Self-pay

## 2021-07-01 ENCOUNTER — Emergency Department (HOSPITAL_COMMUNITY)
Admission: EM | Admit: 2021-07-01 | Discharge: 2021-07-01 | Disposition: A | Discharge status: Home / Self Care | Payer: Medicaid Other | Attending: Emergency Medicine | Admitting: Emergency Medicine

## 2021-07-01 DIAGNOSIS — Y906 Blood alcohol level of 120-199 mg/100 ml: Secondary | ICD-10-CM | POA: Insufficient documentation

## 2021-07-01 DIAGNOSIS — I10 Essential (primary) hypertension: Secondary | ICD-10-CM | POA: Insufficient documentation

## 2021-07-01 DIAGNOSIS — D649 Anemia, unspecified: Secondary | ICD-10-CM | POA: Diagnosis not present

## 2021-07-01 DIAGNOSIS — R0789 Other chest pain: Secondary | ICD-10-CM

## 2021-07-01 DIAGNOSIS — E860 Dehydration: Secondary | ICD-10-CM

## 2021-07-01 DIAGNOSIS — J449 Chronic obstructive pulmonary disease, unspecified: Secondary | ICD-10-CM | POA: Insufficient documentation

## 2021-07-01 DIAGNOSIS — Z79899 Other long term (current) drug therapy: Secondary | ICD-10-CM | POA: Insufficient documentation

## 2021-07-01 DIAGNOSIS — Z20822 Contact with and (suspected) exposure to covid-19: Secondary | ICD-10-CM | POA: Diagnosis not present

## 2021-07-01 DIAGNOSIS — R000 Tachycardia, unspecified: Secondary | ICD-10-CM | POA: Diagnosis not present

## 2021-07-01 DIAGNOSIS — F1010 Alcohol abuse, uncomplicated: Secondary | ICD-10-CM | POA: Diagnosis not present

## 2021-07-01 LAB — CBC WITH DIFFERENTIAL/PLATELET
Abs Immature Granulocytes: 0.03 10*3/uL (ref 0.00–0.07)
Basophils Absolute: 0.1 10*3/uL (ref 0.0–0.1)
Basophils Relative: 1 %
Eosinophils Absolute: 0 10*3/uL (ref 0.0–0.5)
Eosinophils Relative: 0 %
HCT: 35.1 % — ABNORMAL LOW (ref 36.0–46.0)
Hemoglobin: 11.8 g/dL — ABNORMAL LOW (ref 12.0–15.0)
Immature Granulocytes: 1 %
Lymphocytes Relative: 21 %
Lymphs Abs: 1.4 10*3/uL (ref 0.7–4.0)
MCH: 33.3 pg (ref 26.0–34.0)
MCHC: 33.6 g/dL (ref 30.0–36.0)
MCV: 99.2 fL (ref 80.0–100.0)
Monocytes Absolute: 0.3 10*3/uL (ref 0.1–1.0)
Monocytes Relative: 4 %
Neutro Abs: 4.7 10*3/uL (ref 1.7–7.7)
Neutrophils Relative %: 73 %
Platelets: 219 10*3/uL (ref 150–400)
RBC: 3.54 MIL/uL — ABNORMAL LOW (ref 3.87–5.11)
RDW: 16.3 % — ABNORMAL HIGH (ref 11.5–15.5)
WBC: 6.4 10*3/uL (ref 4.0–10.5)
nRBC: 0.3 % — ABNORMAL HIGH (ref 0.0–0.2)

## 2021-07-01 LAB — RESP PANEL BY RT-PCR (FLU A&B, COVID) ARPGX2
Influenza A by PCR: NEGATIVE
Influenza B by PCR: NEGATIVE
SARS Coronavirus 2 by RT PCR: NEGATIVE

## 2021-07-01 LAB — AMMONIA: Ammonia: 17 umol/L (ref 9–35)

## 2021-07-01 LAB — URINALYSIS, ROUTINE W REFLEX MICROSCOPIC
Bilirubin Urine: NEGATIVE
Glucose, UA: NEGATIVE mg/dL
Hgb urine dipstick: NEGATIVE
Ketones, ur: 80 mg/dL — AB
Leukocytes,Ua: NEGATIVE
Nitrite: NEGATIVE
Protein, ur: NEGATIVE mg/dL
Specific Gravity, Urine: 1.013 (ref 1.005–1.030)
pH: 5 (ref 5.0–8.0)

## 2021-07-01 LAB — COMPREHENSIVE METABOLIC PANEL
ALT: 32 U/L (ref 0–44)
AST: 63 U/L — ABNORMAL HIGH (ref 15–41)
Albumin: 4 g/dL (ref 3.5–5.0)
Alkaline Phosphatase: 96 U/L (ref 38–126)
Anion gap: 23 — ABNORMAL HIGH (ref 5–15)
BUN: 9 mg/dL (ref 6–20)
CO2: 14 mmol/L — ABNORMAL LOW (ref 22–32)
Calcium: 8.6 mg/dL — ABNORMAL LOW (ref 8.9–10.3)
Chloride: 102 mmol/L (ref 98–111)
Creatinine, Ser: 0.61 mg/dL (ref 0.44–1.00)
GFR, Estimated: >60 mL/min (ref >60)
Glucose, Bld: 48 mg/dL — ABNORMAL LOW (ref 70–99)
Potassium: 3.3 mmol/L — ABNORMAL LOW (ref 3.5–5.1)
Sodium: 139 mmol/L (ref 135–145)
Total Bilirubin: 0.7 mg/dL (ref 0.3–1.2)
Total Protein: 7.2 g/dL (ref 6.5–8.1)

## 2021-07-01 LAB — RAPID URINE DRUG SCREEN, HOSP PERFORMED
Amphetamines: NOT DETECTED
Barbiturates: NOT DETECTED
Benzodiazepines: NOT DETECTED
Cocaine: NOT DETECTED
Opiates: NOT DETECTED
Tetrahydrocannabinol: NOT DETECTED

## 2021-07-01 LAB — MAGNESIUM: Magnesium: 1.7 mg/dL (ref 1.7–2.4)

## 2021-07-01 LAB — LIPASE, BLOOD: Lipase: 25 U/L (ref 11–51)

## 2021-07-01 LAB — ETHANOL: Alcohol, Ethyl (B): 133 mg/dL — ABNORMAL HIGH (ref <10)

## 2021-07-01 LAB — PREGNANCY, URINE: Preg Test, Ur: NEGATIVE

## 2021-07-01 MED ORDER — ALUM & MAG HYDROXIDE-SIMETH 200-200-20 MG/5ML PO SUSP
30.0000 mL | Freq: Once | ORAL | Status: AC
  Administered 2021-07-01: 30 mL via ORAL
  Filled 2021-07-01: qty 30

## 2021-07-01 MED ORDER — KETOROLAC TROMETHAMINE 30 MG/ML IJ SOLN
30.0000 mg | Freq: Once | INTRAMUSCULAR | Status: AC
  Administered 2021-07-01: 30 mg via INTRAVENOUS
  Filled 2021-07-01: qty 1

## 2021-07-01 MED ORDER — SODIUM CHLORIDE 0.9 % IV BOLUS
1000.0000 mL | Freq: Once | INTRAVENOUS | Status: AC
  Administered 2021-07-01: 1000 mL via INTRAVENOUS

## 2021-07-01 MED ORDER — LORAZEPAM 2 MG/ML IJ SOLN
0.5000 mg | Freq: Once | INTRAMUSCULAR | Status: AC
  Administered 2021-07-01: 0.5 mg via INTRAVENOUS
  Filled 2021-07-01: qty 1

## 2021-07-01 MED ORDER — ONDANSETRON 4 MG PO TBDP: 4.0000 mg | ORAL_TABLET | Freq: Three times a day (TID) | ORAL | 0 refills | Status: DC | PRN

## 2021-07-01 MED ORDER — FAMOTIDINE 20 MG PO TABS: 20.0000 mg | ORAL_TABLET | Freq: Every day | ORAL | 0 refills | Status: DC

## 2021-07-01 NOTE — ED Notes (Signed)
Informed pt a urine sample is needed ?

## 2021-07-01 NOTE — Discharge Instructions (Addendum)
Try to decrease or stop alcohol intake. ?

## 2021-07-01 NOTE — ED Provider Notes (Signed)
?[place] EMERGENCY DEPARTMENT ?Provider Note ? ? ?CSN: [id number] ?Arrival date & time: 07/01/21  0601 ? ?  ? ?History ? ?Chief Complaint  ?Patient presents with  ? Chest Pain  ? ? ?Hannah Rhodes is a 41 y.o. female. ? ?Pt is a 41 yo female with a hx of htn, chronic hep C, etoh abuse, copd, gerd, migraines, headaches, anxiety, and ddd.  Pt said she developed cp a few hrs ago.  She also has chills and sweats.  She vomited once.  She denies fevers.  No abd pain.  No sob. ? ? ?  ? ?Home Medications ?Prior to Admission medications   ?Medication Sig Start Date End Date Taking? Authorizing Provider  ?albuterol (VENTOLIN HFA) 108 (90 Base) MCG/ACT inhaler Inhale 1-2 puffs into the lungs every 6 (six) hours as needed for wheezing or shortness of breath. 09/12/20  Yes Pricilla Riffle, MD  ?amLODipine (NORVASC) 10 MG tablet Take 10 mg by mouth daily.   Yes [provider]  ?famotidine (PEPCID) 20 MG tablet Take 1 tablet (20 mg total) by mouth daily. 07/01/21  Yes Jacalyn Lefevre, MD  ?metoprolol tartrate (LOPRESSOR) 50 MG tablet Take 100 mg by mouth 2 (two) times daily. 100 mg BID   Yes [provider]  ?ondansetron (ZOFRAN-ODT) 4 MG disintegrating tablet Take 1 tablet (4 mg total) by mouth every 8 (eight) hours as needed for nausea or vomiting. 07/01/21  Yes Jacalyn Lefevre, MD  ?   ? ?Allergies    ?Wellbutrin [bupropion] and Adhesive [tape]   ? ?Review of Systems   ?Review of Systems  ?Constitutional:  Positive for chills.  ?Cardiovascular:  Positive for chest pain.  ?Gastrointestinal:  Positive for nausea and vomiting.  ?All other systems reviewed and are negative. ? ?Physical Exam ?Updated Vital Signs ?BP 114/76   Pulse 97   Temp 98.1 ?F (36.7 ?C)   Resp 17   Ht 4\' 10"  (1.473 m)   Wt 44.5 kg   LMP 06/23/2021 (Approximate)   SpO2 96%   BMI 20.48 kg/m?  ?Physical Exam ?Vitals and nursing note reviewed.  ?Constitutional:   ?   Appearance: She is underweight.  ?HENT:  ?   Head: Normocephalic and  atraumatic.  ?   Mouth/Throat:  ?   Mouth: Mucous membranes are dry.  ?Eyes:  ?   Extraocular Movements: Extraocular movements intact.  ?   Pupils: Pupils are equal, round, and reactive to light.  ?Cardiovascular:  ?   Rate and Rhythm: Regular rhythm. Tachycardia present.  ?   Heart sounds: Normal heart sounds.  ?Pulmonary:  ?   Effort: Pulmonary effort is normal.  ?   Breath sounds: Normal breath sounds.  ?Chest:  ? ? ?Abdominal:  ?   General: Bowel sounds are normal.  ?   Palpations: Abdomen is soft.  ?Musculoskeletal:     ?   General: Normal range of motion.  ?   Cervical back: Normal range of motion and neck supple.  ?Skin: ?   General: Skin is warm.  ?   Capillary Refill: Capillary refill takes less than 2 seconds.  ?Neurological:  ?   General: No focal deficit present.  ?   Mental Status: She is alert and oriented to person, place, and time.  ?Psychiatric:     ?   Mood and Affect: Mood normal.     ?   Behavior: Behavior normal.  ? ? ?ED Results / Procedures / Treatments   ?Labs ?(all labs  ordered are listed, but only abnormal results are displayed) ?Labs Reviewed  ?CBC WITH DIFFERENTIAL/PLATELET - Abnormal; Notable for the following components:  ?    Result Value  ? RBC 3.54 (*)   ? Hemoglobin 11.8 (*)   ? HCT 35.1 (*)   ? RDW 16.3 (*)   ? nRBC 0.3 (*)   ? All other components within normal limits  ?COMPREHENSIVE METABOLIC PANEL - Abnormal; Notable for the following components:  ? Potassium 3.3 (*)   ? CO2 14 (*)   ? Glucose, Bld 48 (*)   ? Calcium 8.6 (*)   ? AST 63 (*)   ? Anion gap 23 (*)   ? All other components within normal limits  ?URINALYSIS, ROUTINE W REFLEX MICROSCOPIC - Abnormal; Notable for the following components:  ? Color, Urine STRAW (*)   ? Ketones, ur 80 (*)   ? All other components within normal limits  ?ETHANOL - Abnormal; Notable for the following components:  ? Alcohol, Ethyl (B) 133 (*)   ? All other components within normal limits  ?RESP PANEL BY RT-PCR (FLU A&B, COVID) ARPGX2  ?LIPASE,  BLOOD  ?PREGNANCY, URINE  ?AMMONIA  ?MAGNESIUM  ?RAPID URINE DRUG SCREEN, HOSP PERFORMED  ? ? ?EKG ?EKG Interpretation ? ?Date/Time:  Saturday July 01 2021 06:33:45 EDT ?Ventricular Rate:  109 ?PR Interval:  138 ?QRS Duration: 95 ?QT Interval:  361 ?QTC Calculation: 487 ?R Axis:   -2 ?Text Interpretation: Sinus tachycardia Ventricular premature complex Aberrant complex Right atrial enlargement Low voltage, extremity and precordial leads Minimal ST depression, inferior leads Borderline prolonged QT interval No significant change since last tracing Confirmed by Jacalyn Lefevre [phone]) on 07/01/2021 7:12:50 AM ? ?Radiology ?DG Chest 2 View ? ?Result Date: 07/01/2021 ?CLINICAL DATA:  41 year old female with acute chest pain, diaphoresis. EXAM: CHEST - 2 VIEW COMPARISON:  Chest radiographs 06/03/2021 and earlier. FINDINGS: Lung volumes and mediastinal contours remain normal. Visualized tracheal air column is within normal limits. Both lungs appear stable and clear. No pneumothorax or pleural effusion. No osseous abnormality identified. Negative visible bowel gas. IMPRESSION: Negative.  No cardiopulmonary abnormality. Electronically Signed   By: Odessa Fleming M.D.   On: 07/01/2021 07:17   ? ?Procedures ?Procedures  ? ? ?Medications Ordered in ED ?Medications  ?sodium chloride 0.9 % bolus 1,000 mL (has no administration in time range)  ?sodium chloride 0.9 % bolus 1,000 mL (1,000 mLs Intravenous Bolus 07/01/21 0725)  ?ketorolac (TORADOL) 30 MG/ML injection 30 mg (30 mg Intravenous Given 07/01/21 0725)  ?LORazepam (ATIVAN) injection 0.5 mg (0.5 mg Intravenous Given 07/01/21 0904)  ?alum & mag hydroxide-simeth (MAALOX/MYLANTA) 200-200-20 MG/5ML suspension 30 mL (30 mLs Oral Given 07/01/21 1018)  ? ? ?ED Course/ Medical Decision Making/ A&P ?  ?                        ?Medical Decision Making ?Amount and/or Complexity of Data Reviewed ?Labs: ordered. ? ?Risk ?OTC drugs. ?Prescription drug management. ? ? ?This patient presents to the ED  for concern of cp, this involves an extensive number of treatment options, and is a complaint that carries with it a high risk of complications and morbidity.  The differential diagnosis includes msk, cad, pe, pna, uri, covid/flu ? ? ?Co morbidities that complicate the patient evaluation ? ?htn, chronic hep C, etoh abuse, copd, gerd, migraines, headaches, anxiety, and ddd ? ? ?Additional history obtained: ? ?Additional history obtained from epic chart review ? ? ? ?  Lab Tests: ? ?I Ordered, and personally interpreted labs.  The pertinent results include:  cbc with chronic anemia (hgb 11.8); cmp with K 3.3 and CO 14, Glc 48; lip 25; etoh elevated at 133; covid/flu neg ? ? ?Imaging Studies ordered: ? ?I ordered imaging studies including CXR  ?I independently visualized and interpreted imaging which showed  ?  ?IMPRESSION:  ?Negative.  No cardiopulmonary abnormality.  ? ?I agree with the radiologist interpretation ? ? ?Cardiac Monitoring: ? ?The patient was maintained on a cardiac monitor.  I personally viewed and interpreted the cardiac monitored which showed an underlying rhythm of: sinus tachy ? ? ?Medicines ordered and prescription drug management: ? ?I ordered medication including IVFs, toradol/ativan  for dehydration and pain  ?Reevaluation of the patient after these medicines showed that the patient improved ?I have reviewed the patients home medicines and have made adjustments as needed ? ? ?Test Considered: ? ?Ct chest, but not hypoxic ? ? ?Critical Interventions: ? ?ivfs ? ? ?Problem List / ED Course: ? ?CP:  atypical.  Chest is tender to palpation.  This is likely related to esophageal or stomach irritation from drinking.  Pt is encouraged to stop drinking.  She will be put on pepcid and is told to f/u with gi.   ? ? ?Reevaluation: ? ?After the interventions noted above, I reevaluated the patient and found that they have :improved ? ? ?Social Determinants of Health: ? ?Lives at home.  Hx etoh  abuse ? ? ?Dispostion: ? ?After consideration of the diagnostic results and the patients response to treatment, I feel that the patent would benefit from discharge with outpatient f/u.   ? ? ? ? ? ? ? ?Final Clinical Impression(s)

## 2021-07-01 NOTE — ED Triage Notes (Signed)
Pt to ED from home with c/o chest pain that started a couple of hours ago, pt reports chills and sweating that have now resolved. NO other symptoms reported. Pt reports vomiting once and blurry vision that started on the way here.  ?

## 2021-07-04 ENCOUNTER — Ambulatory Visit: Payer: Self-pay | Admitting: Diagnostic Neuroimaging

## 2021-07-26 ENCOUNTER — Telehealth: Payer: Self-pay | Admitting: Student

## 2021-07-26 NOTE — Telephone Encounter (Signed)
Per last ov note, pt is pending f/u. When should she be seen?  ?

## 2021-07-26 NOTE — Telephone Encounter (Signed)
?  1. Which medications need to be refilled? Metoprolol Tart 50mg  tab Take 1 & 1/2 tablets by mouth in the morning and 2 in the evening. ?2. Which pharmacy/location (including street and city if local pharmacy) is medication to be sent to? [facility] in [place] on [address] ? ?3. Do they need a 30 day or 90 day supply? Qty 105 ?

## 2021-08-01 MED ORDER — METOPROLOL TARTRATE 50 MG PO TABS: 100.0000 mg | ORAL_TABLET | Freq: Two times a day (BID) | ORAL | 0 refills | Status: DC

## 2021-08-01 NOTE — Telephone Encounter (Signed)
She can be seen this summer  ?

## 2021-08-11 ENCOUNTER — Ambulatory Visit: Payer: Medicaid Other | Admitting: Diagnostic Neuroimaging

## 2021-08-11 ENCOUNTER — Encounter: Payer: Self-pay | Admitting: Diagnostic Neuroimaging

## 2021-08-11 VITALS — BP 123/83 | HR 92 | Ht <= 58 in | Wt 92.0 lb

## 2021-08-11 DIAGNOSIS — R413 Other amnesia: Secondary | ICD-10-CM

## 2021-08-11 DIAGNOSIS — F1020 Alcohol dependence, uncomplicated: Secondary | ICD-10-CM | POA: Diagnosis not present

## 2021-08-11 DIAGNOSIS — F419 Anxiety disorder, unspecified: Secondary | ICD-10-CM | POA: Diagnosis not present

## 2021-08-11 DIAGNOSIS — G4700 Insomnia, unspecified: Secondary | ICD-10-CM | POA: Diagnosis not present

## 2021-08-11 NOTE — Patient Instructions (Addendum)
MILD MEMORY LOSS / COGNITIVE IMPAIRMENT (likely due to chronic alcohol abuse, anxiety, depression, insomnia) - safety / supervision issues reviewed - daily physical activity / exercise (at least 15-30 minutes) - eat more plants / vegetables - increase social activities, brain stimulation, games, puzzles, hobbies, crafts, arts, music - aim for at least 7-8 hours sleep per night (or more) - avoid smoking and alcohol - caution with medications, finances, driving - follow up with PCP / psychiatry re: anxiety, insomnia, alcohol

## 2021-08-11 NOTE — Progress Notes (Signed)
[facility]  PATIENT: Hannah Rhodes DOB: [birth year]  REFERRING CLINICIAN: Suzan Slick, MD HISTORY FROM: patient REASON FOR VISIT: new consult   HISTORICAL  CHIEF COMPLAINT:  Chief Complaint  Patient presents with   New Patient (Initial Visit)    Pt alone, rm 6. Pt states that alzheimers runs In family. Her mom and pcp wanted her to make this appt. She is very forgetful more so short term. They have noticed worsening.     HISTORY OF PRESENT ILLNESS:   41 year old female here for evaluation of memory loss.    Patient reports onset of mild short-term memory loss and cognitive issues since 2012.  At that time patient was [redacted] weeks pregnant and hospitalized for pneumonia.  She was in intensive care unit for 2 weeks.  She delivered premature baby via C-section, who had to stay in NICU for prolonged time, but fortunately is healthy today.  Patient also has chronic anxiety, depression, insomnia, alcohol abuse.  She is not currently getting treatment.  She has cut down alcohol from prior.    REVIEW OF SYSTEMS: Full 14 system review of systems performed and negative with exception of: as per HPI.  ALLERGIES: Allergies  Allergen Reactions   Wellbutrin [Bupropion] Nausea And Vomiting   Adhesive [Tape] Rash    Tears skin Ok with paper tape    HOME MEDICATIONS: Outpatient Medications Prior to Visit  Medication Sig Dispense Refill   albuterol (VENTOLIN HFA) 108 (90 Base) MCG/ACT inhaler Inhale 1-2 puffs into the lungs every 6 (six) hours as needed for wheezing or shortness of breath. 6.7 g 0   amLODipine (NORVASC) 10 MG tablet Take 10 mg by mouth daily.     famotidine (PEPCID) 20 MG tablet Take 1 tablet (20 mg total) by mouth daily. 30 tablet 0   metoprolol tartrate (LOPRESSOR) 50 MG tablet Take 2 tablets (100 mg total) by mouth 2 (two) times daily. 100 mg BID 60 tablet 0   ondansetron (ZOFRAN-ODT) 4 MG disintegrating tablet Take 1 tablet (4 mg total) by mouth  every 8 (eight) hours as needed for nausea or vomiting. 10 tablet 0   No facility-administered medications prior to visit.    PAST MEDICAL HISTORY: Past Medical History:  Diagnosis Date   Alcoholism (HCC)    Anxiety    Chronic hepatitis C (HCC)    Coma (HCC)    while pregnant   COPD (chronic obstructive pulmonary disease) (HCC)    no inaler   DDD (degenerative disc disease), lumbar    Fatty liver    Generalized headaches    GERD (gastroesophageal reflux disease)    diet controlled, no med   Hepatitis C    Heroin use    HPV (human papilloma virus) infection    Hypertension    Insomnia    Migraine    Panic attack    Pneumonia 03/2010   Smoker    Tachycardia     PAST SURGICAL HISTORY: Past Surgical History:  Procedure Laterality Date   BIOPSY  09/28/2019   Procedure: BIOPSY;  Surgeon: Lemar Lofty., MD;  Location: [facility] ENDOSCOPY;  Service: Gastroenterology;;   CESAREAN SECTION     CESAREAN SECTION     ESOPHAGOGASTRODUODENOSCOPY (EGD) WITH PROPOFOL N/A 09/28/2019   Procedure: ESOPHAGOGASTRODUODENOSCOPY (EGD) WITH PROPOFOL;  Surgeon: Lemar Lofty., MD;  Location: [facility] ENDOSCOPY;  Service: Gastroenterology;  Laterality: N/A;   EUS  09/28/2019   Procedure: UPPER ENDOSCOPIC ULTRASOUND (EUS) LINEAR;  Surgeon: Lemar Lofty., MD;  Location: [facility] ENDOSCOPY;  Service: Gastroenterology;;   EUS  09/28/2019   Procedure: FULL UPPER ENDOSCOPIC ULTRASOUND (EUS) RADIAL;  Surgeon: Meridee Score Netty Starring., MD;  Location: [facility] ENDOSCOPY;  Service: Gastroenterology;;   FINE NEEDLE ASPIRATION  09/28/2019   Procedure: FINE NEEDLE ASPIRATION (FNA) LINEAR;  Surgeon: Lemar Lofty., MD;  Location: [facility] ENDOSCOPY;  Service: Gastroenterology;;   SMALL INTESTINE SURGERY     WISDOM TOOTH EXTRACTION      FAMILY HISTORY: Family History  Problem Relation Age of Onset   Heart failure Mother    CAD Mother    Heart attack Mother    Anxiety disorder Sister    Restless legs  syndrome Sister    Dementia Maternal Grandmother    Dementia Maternal Grandfather    Colon cancer Neg Hx    Esophageal cancer Neg Hx    Inflammatory bowel disease Neg Hx    Liver disease Neg Hx    Pancreatic cancer Neg Hx    Stomach cancer Neg Hx    Rectal cancer Neg Hx     SOCIAL HISTORY: Social History   Socioeconomic History   Marital status: Single    Spouse name: Not on file   Number of children: 7   Years of education: Not on file   Highest education level: Not on file  Occupational History   Occupation: Homemaker  Tobacco Use   Smoking status: Every Day    Packs/day: 0.50    Years: 24.00    Pack years: 12.00    Types: Cigarettes   Smokeless tobacco: Never   Tobacco comments:    Cut back to 5 cig/day  Vaping Use   Vaping Use: Never used  Substance and Sexual Activity   Alcohol use: Yes    Comment: everyday. drinks whatever she can get her hands on   Drug use: Not Currently    Types: Marijuana, Heroin, Oxycodone, Hydrocodone, Cocaine    Comment: nothing at this time (last month was last use 07/01/21)   Sexual activity: Not Currently  Other Topics Concern   Not on file  Social History Narrative   Not on file   Social Determinants of Health   Financial Resource Strain: Not on file  Food Insecurity: Not on file  Transportation Needs: Not on file  Physical Activity: Not on file  Stress: Not on file  Social Connections: Not on file  Intimate Partner Violence: Not on file     PHYSICAL EXAM  GENERAL EXAM/CONSTITUTIONAL: Vitals:  Vitals:   08/11/21 0935  BP: 123/83  Pulse: 92  Weight: 92 lb (41.7 kg)  Height: 4\' 10"  (1.473 m)   Body mass index is 19.23 kg/m. Wt Readings from Last 3 Encounters:  08/11/21 92 lb (41.7 kg)  07/01/21 98 lb (44.5 kg)  06/04/21 94 lb (42.6 kg)   Patient is in no distress; well developed, nourished and groomed; neck is supple  CARDIOVASCULAR: Examination of carotid arteries is normal; no carotid bruits Regular rate  and rhythm, no murmurs Examination of peripheral vascular system by observation and palpation is normal  EYES: Ophthalmoscopic exam of optic discs and posterior segments is normal; no papilledema or hemorrhages No results found.  MUSCULOSKELETAL: Gait, strength, tone, movements noted in Neurologic exam below  NEUROLOGIC: MENTAL STATUS:     08/11/2021    9:37 AM  MMSE - Mini Mental State Exam  Orientation to time 5  Orientation to Place 5  Registration 3  Attention/ Calculation 3  Recall 1  Language- name  2 objects 2  Language- repeat 1  Language- follow 3 step command 3  Language- read & follow direction 1  Write a sentence 1  Copy design 1  Total score 26   awake, alert, oriented to person, place and time recent and remote memory intact normal attention and concentration language fluent, comprehension intact, naming intact fund of knowledge appropriate  CRANIAL NERVE:  2nd - no papilledema on fundoscopic exam 2nd, 3rd, 4th, 6th - pupils equal and reactive to light, visual fields full to confrontation, extraocular muscles intact, no nystagmus 5th - facial sensation symmetric 7th - facial strength symmetric 8th - hearing intact 9th - palate elevates symmetrically, uvula midline 11th - shoulder shrug symmetric 12th - tongue protrusion midline  MOTOR:  normal bulk and tone, full strength in the BUE, BLE  SENSORY:  normal and symmetric to light touch, pinprick, temperature, vibration  COORDINATION:  finger-nose-finger, fine finger movements normal  REFLEXES:  deep tendon reflexes present and symmetric  GAIT/STATION:  narrow based gait     DIAGNOSTIC DATA (LABS, IMAGING, TESTING) - I reviewed patient records, labs, notes, testing and imaging myself where available.  Lab Results  Component Value Date   WBC 6.4 07/01/2021   HGB 11.8 (L) 07/01/2021   HCT 35.1 (L) 07/01/2021   MCV 99.2 07/01/2021   PLT 219 07/01/2021      Component Value Date/Time    NA 139 07/01/2021 0637   K 3.3 (L) 07/01/2021 0637   CL 102 07/01/2021 0637   CO2 14 (L) 07/01/2021 0637   GLUCOSE 48 (L) 07/01/2021 0637   BUN 9 07/01/2021 0637   CREATININE 0.61 07/01/2021 0637   CALCIUM 8.6 (L) 07/01/2021 0637   PROT 7.2 07/01/2021 0637   ALBUMIN 4.0 07/01/2021 0637   AST 63 (H) 07/01/2021 0637   ALT 32 07/01/2021 0637   ALKPHOS 96 07/01/2021 0637   BILITOT 0.7 07/01/2021 0637   GFRNONAA >60 07/01/2021 0637   GFRAA >60 11/09/2019 0521   No results found for: CHOL, HDL, LDLCALC, LDLDIRECT, TRIG, CHOLHDL No results found for: ZOXW9UHGBA1C No results found for: VITAMINB12 Lab Results  Component Value Date   TSH 1.348 09/09/2020     Ref Range & Units 3 mo ago  Vitamin B-12 232 - 1245 pg/mL 376     12/20/20 CT head [I reviewed images myself and agree with interpretation. -VRP]  1.  No acute intracranial abnormality. 2.  No acute displaced fracture or traumatic listhesis of the cervical spine.    ASSESSMENT AND PLAN  41 y.o. year old female here with:   Dx:  1. Memory loss   2. Alcoholism (HCC)   3. Anxiety   4. Insomnia, unspecified type      PLAN:  MILD MEMORY LOSS / COGNITIVE IMPAIRMENT (likely due to chronic alcohol abuse, anxiety, depression, insomnia; do not suspect alzheimer's as questioned by patient) - safety / supervision issues reviewed - daily physical activity / exercise (at least 15-30 minutes) - eat more plants / vegetables - increase social activities, brain stimulation, games, puzzles, hobbies, crafts, arts, music - aim for at least 7-8 hours sleep per night (or more) - avoid smoking and alcohol - caution with medications, finances, driving - follow up with PCP / psychiatry re: anxiety, insomnia, alcohol  Return for return to PCP.    Suanne MarkerVIKRAM R. Tariq Pernell, MD 08/11/2021, 10:31 AM Certified in Neurology, Neurophysiology and Neuroimaging  [facility] [address], [address] [place], [place] [address] [phone]  [phone]

## 2022-01-18 ENCOUNTER — Other Ambulatory Visit: Payer: Self-pay

## 2022-01-18 ENCOUNTER — Emergency Department (HOSPITAL_COMMUNITY)
Admission: EM | Admit: 2022-01-18 | Discharge: 2022-01-18 | Disposition: A | Discharge status: Home / Self Care | Payer: Medicaid Other | Attending: Emergency Medicine | Admitting: Emergency Medicine

## 2022-01-18 ENCOUNTER — Emergency Department (HOSPITAL_COMMUNITY): Payer: Medicaid Other

## 2022-01-18 ENCOUNTER — Encounter (HOSPITAL_COMMUNITY): Payer: Self-pay | Admitting: Emergency Medicine

## 2022-01-18 DIAGNOSIS — I10 Essential (primary) hypertension: Secondary | ICD-10-CM | POA: Insufficient documentation

## 2022-01-18 DIAGNOSIS — R079 Chest pain, unspecified: Secondary | ICD-10-CM | POA: Insufficient documentation

## 2022-01-18 LAB — CBC
HCT: 42.3 % (ref 36.0–46.0)
Hemoglobin: 14.5 g/dL (ref 12.0–15.0)
MCH: 29.8 pg (ref 26.0–34.0)
MCHC: 34.3 g/dL (ref 30.0–36.0)
MCV: 86.9 fL (ref 80.0–100.0)
Platelets: 348 10*3/uL (ref 150–400)
RBC: 4.87 MIL/uL (ref 3.87–5.11)
RDW: 17.6 % — ABNORMAL HIGH (ref 11.5–15.5)
WBC: 5.9 10*3/uL (ref 4.0–10.5)
nRBC: 0 % (ref 0.0–0.2)

## 2022-01-18 LAB — COMPREHENSIVE METABOLIC PANEL
ALT: 19 U/L (ref 0–44)
AST: 35 U/L (ref 15–41)
Albumin: 3.9 g/dL (ref 3.5–5.0)
Alkaline Phosphatase: 86 U/L (ref 38–126)
Anion gap: 18 — ABNORMAL HIGH (ref 5–15)
BUN: 11 mg/dL (ref 6–20)
CO2: 17 mmol/L — ABNORMAL LOW (ref 22–32)
Calcium: 8.8 mg/dL — ABNORMAL LOW (ref 8.9–10.3)
Chloride: 104 mmol/L (ref 98–111)
Creatinine, Ser: 0.9 mg/dL (ref 0.44–1.00)
GFR, Estimated: >60 mL/min (ref >60)
Glucose, Bld: 72 mg/dL (ref 70–99)
Potassium: 3.6 mmol/L (ref 3.5–5.1)
Sodium: 139 mmol/L (ref 135–145)
Total Bilirubin: 0.4 mg/dL (ref 0.3–1.2)
Total Protein: 7.5 g/dL (ref 6.5–8.1)

## 2022-01-18 LAB — TROPONIN I (HIGH SENSITIVITY)
Troponin I (High Sensitivity): 3 ng/L (ref <18)
Troponin I (High Sensitivity): 4 ng/L (ref <18)

## 2022-01-18 MED ORDER — LORAZEPAM 1 MG PO TABS
1.0000 mg | ORAL_TABLET | Freq: Once | ORAL | Status: AC
  Administered 2022-01-18: 1 mg via ORAL
  Filled 2022-01-18: qty 1

## 2022-01-18 NOTE — ED Provider Notes (Signed)
[facility] EMERGENCY DEPARTMENT Provider Note   CSN: [id number] Arrival date & time: 01/18/22  1436     History Chief Complaint  Patient presents with   Chest Pain    Hannah Rhodes is a 41 y.o. female patient with history of chronic hepatitis, alcoholism, anxiety, and hypertension who presents to the emergency department today for further evaluation of chest pain.  This started today.  Patient states that she has been under a lot of stress and her daughter has been in and out of multiple hospitals really for the last several months.  She states that she was having an anxiety attack today which typically manifest with chest pain similar to today but the chest pain has been lingering which prompted her arrival to the ED.  She denies any recent illness, fever, diaphoresis, shortness of breath.   Chest Pain      Home Medications Prior to Admission medications   Medication Sig Start Date End Date Taking? Authorizing Provider  diphenhydramine-acetaminophen (TYLENOL PM) 25-500 MG TABS tablet Take 2 tablets by mouth at bedtime as needed.   Yes [provider]  albuterol (VENTOLIN HFA) 108 (90 Base) MCG/ACT inhaler Inhale 1-2 puffs into the lungs every 6 (six) hours as needed for wheezing or shortness of breath. Patient not taking: Reported on 01/18/2022 09/12/20   Pricilla Riffle, MD  amLODipine (NORVASC) 10 MG tablet Take 10 mg by mouth daily. Patient not taking: Reported on 01/18/2022    [provider]  famotidine (PEPCID) 20 MG tablet Take 1 tablet (20 mg total) by mouth daily. Patient not taking: Reported on 01/18/2022 07/01/21   Jacalyn Lefevre, MD  metoprolol tartrate (LOPRESSOR) 50 MG tablet Take 2 tablets (100 mg total) by mouth 2 (two) times daily. 100 mg BID Patient not taking: Reported on 01/18/2022 08/01/21   Pricilla Riffle, MD      Allergies    Wellbutrin [bupropion] and Adhesive [tape]    Review of Systems   Review of Systems  Cardiovascular:  Positive for chest  pain.  All other systems reviewed and are negative.   Physical Exam Updated Vital Signs BP 110/82   Pulse (!) 103   Temp 98 F (36.7 C) (Oral)   Resp 16   Ht 4\' 10"  (1.473 m)   Wt 42.5 kg   LMP 01/16/2022   SpO2 96%   BMI 19.56 kg/m  Physical Exam Vitals and nursing note reviewed.  Constitutional:      General: She is not in acute distress.    Appearance: Normal appearance.  HENT:     Head: Normocephalic and atraumatic.  Eyes:     General:        Right eye: No discharge.        Left eye: No discharge.  Cardiovascular:     Comments: Regular rate and rhythm.  S1/S2 are distinct without any evidence of murmur, rubs, or gallops.  Radial pulses are 2+ bilaterally.  Dorsalis pedis pulses are 2+ bilaterally.  No evidence of pedal edema. Pulmonary:     Comments: Clear to auscultation bilaterally.  Normal effort.  No respiratory distress.  No evidence of wheezes, rales, or rhonchi heard throughout. Abdominal:     General: Abdomen is flat. Bowel sounds are normal. There is no distension.     Tenderness: There is no abdominal tenderness. There is no guarding or rebound.  Musculoskeletal:        General: Normal range of motion.     Cervical back: Neck  supple.  Skin:    General: Skin is warm and dry.     Findings: No rash.  Neurological:     General: No focal deficit present.     Mental Status: She is alert.  Psychiatric:        Mood and Affect: Mood normal. Affect is tearful.        Behavior: Behavior normal.     ED Results / Procedures / Treatments   Labs (all labs ordered are listed, but only abnormal results are displayed) Labs Reviewed  CBC - Abnormal; Notable for the following components:      Result Value   RDW 17.6 (*)    All other components within normal limits  COMPREHENSIVE METABOLIC PANEL - Abnormal; Notable for the following components:   CO2 17 (*)    Calcium 8.8 (*)    Anion gap 18 (*)    All other components within normal limits  TROPONIN I (HIGH  SENSITIVITY)  TROPONIN I (HIGH SENSITIVITY)    EKG None  Radiology DG Chest 2 View  Result Date: 01/18/2022 CLINICAL DATA:  Chest pain. EXAM: CHEST - 2 VIEW COMPARISON:  Chest x-ray July 01, 2021. FINDINGS: The heart size and mediastinal contours are within normal limits. Both lungs are clear. No visible pleural effusions or pneumothorax. No acute osseous abnormality. IMPRESSION: No active cardiopulmonary disease. Electronically Signed   By: Margaretha Sheffield M.D.   On: 01/18/2022 16:18    Procedures Procedures    Medications Ordered in ED Medications  LORazepam (ATIVAN) tablet 1 mg (1 mg Oral Given 01/18/22 1637)    ED Course/ Medical Decision Making/ A&P Clinical Course as of 01/18/22 1841  Thu Jan 18, 2022  1821 CBC(!) Normal. [CF]  1821 Troponin I (High Sensitivity) Initial troponin is normal. [CF]  1821 Comprehensive metabolic panel(!) Normal. [CF]  1838 Troponin I (High Sensitivity) Delta troponin is normal.  [CF]    Clinical Course User Index [CF] Hendricks Limes, PA-C                           Medical Decision Making Shaylon Gillean is a 41 y.o. female patient who presents to the emergency department today for further evaluation of chest pain.  Seems to be somewhat related to anxiety given the history.  Nonetheless, get cardiac enzymes, EKG, and chest x-ray.  She is in no acute distress.  I will start off by giving her Ativan to see if that improves her symptoms.  After Ativan, patient is more calm she is able to catch her breath.  All of her cardiac enzymes are normal.  EKG is reassuring.  Chest x-ray is clear.  I will have her follow-up with her primary care doctor for further evaluation.  Strict return precautions were discussed.  She is safe for discharge.   Amount and/or Complexity of Data Reviewed Labs: ordered. Decision-making details documented in ED Course. Radiology: ordered.  Risk Prescription drug management.   Final Clinical Impression(s) / ED  Diagnoses Final diagnoses:  Chest pain, unspecified type    Rx / DC Orders ED Discharge Orders     None         Cherrie Gauze 01/18/22 1841    Davonna Belling, MD 01/19/22 [phone]

## 2022-01-18 NOTE — Discharge Instructions (Signed)
As we discussed, this is probably related to your anxiety.  I do not see any evidence that this is coming from your heart.  No signs of heart attack today which is great news.  I would like for you to follow-up with your primary care doctor for further evaluation.  Return to the emergency department for any worsening symptoms that you might have.

## 2022-01-18 NOTE — ED Triage Notes (Signed)
Pt c/o panic attack earlier and states chest is still hurting all over and going into back. Pt c/o not being able to catch her breath. Nad in triage. Color wnl. Non diaphoretic. No resp distress or sob noted.

## 2022-03-14 ENCOUNTER — Other Ambulatory Visit: Payer: Self-pay

## 2022-03-14 ENCOUNTER — Emergency Department (HOSPITAL_COMMUNITY)
Admission: EM | Admit: 2022-03-14 | Discharge: 2022-03-14 | Discharge status: Against Medical Advice | Payer: Medicaid Other | Attending: Emergency Medicine | Admitting: Emergency Medicine

## 2022-03-14 ENCOUNTER — Encounter (HOSPITAL_COMMUNITY): Payer: Self-pay

## 2022-03-14 ENCOUNTER — Emergency Department (HOSPITAL_COMMUNITY)
Admission: EM | Admit: 2022-03-14 | Discharge: 2022-03-14 | Discharge status: Against Medical Advice | Payer: Medicaid Other

## 2022-03-14 DIAGNOSIS — Z5321 Procedure and treatment not carried out due to patient leaving prior to being seen by health care provider: Secondary | ICD-10-CM | POA: Diagnosis not present

## 2022-03-14 DIAGNOSIS — R45851 Suicidal ideations: Secondary | ICD-10-CM | POA: Insufficient documentation

## 2022-03-14 DIAGNOSIS — F419 Anxiety disorder, unspecified: Secondary | ICD-10-CM | POA: Insufficient documentation

## 2022-03-14 NOTE — ED Triage Notes (Signed)
POV from home. Cc of feeling suicidal since today. Says her anxiety has increased as well. Says she is an alcoholic. Denies knowing how much she drank today. Denies plan.

## 2022-03-20 ENCOUNTER — Other Ambulatory Visit: Payer: Self-pay

## 2022-03-20 ENCOUNTER — Encounter (HOSPITAL_COMMUNITY): Payer: Self-pay | Admitting: *Deleted

## 2022-03-20 ENCOUNTER — Emergency Department (HOSPITAL_COMMUNITY)
Admission: EM | Admit: 2022-03-20 | Discharge: 2022-03-21 | Disposition: A | Discharge status: Other Acute Inpatient Hospital | Payer: Medicaid Other | Attending: Emergency Medicine | Admitting: Emergency Medicine

## 2022-03-20 DIAGNOSIS — F32A Depression, unspecified: Secondary | ICD-10-CM | POA: Insufficient documentation

## 2022-03-20 DIAGNOSIS — E876 Hypokalemia: Secondary | ICD-10-CM | POA: Diagnosis not present

## 2022-03-20 DIAGNOSIS — R7401 Elevation of levels of liver transaminase levels: Secondary | ICD-10-CM | POA: Diagnosis not present

## 2022-03-20 DIAGNOSIS — R45851 Suicidal ideations: Secondary | ICD-10-CM | POA: Diagnosis not present

## 2022-03-20 DIAGNOSIS — R000 Tachycardia, unspecified: Secondary | ICD-10-CM | POA: Insufficient documentation

## 2022-03-20 DIAGNOSIS — Z1152 Encounter for screening for COVID-19: Secondary | ICD-10-CM | POA: Insufficient documentation

## 2022-03-20 DIAGNOSIS — F99 Mental disorder, not otherwise specified: Secondary | ICD-10-CM | POA: Insufficient documentation

## 2022-03-20 DIAGNOSIS — F419 Anxiety disorder, unspecified: Secondary | ICD-10-CM | POA: Diagnosis not present

## 2022-03-20 DIAGNOSIS — F1010 Alcohol abuse, uncomplicated: Secondary | ICD-10-CM | POA: Insufficient documentation

## 2022-03-20 DIAGNOSIS — Z8616 Personal history of COVID-19: Secondary | ICD-10-CM | POA: Insufficient documentation

## 2022-03-20 LAB — COMPREHENSIVE METABOLIC PANEL
ALT: 22 U/L (ref 0–44)
AST: 65 U/L — ABNORMAL HIGH (ref 15–41)
Albumin: 4.2 g/dL (ref 3.5–5.0)
Alkaline Phosphatase: 100 U/L (ref 38–126)
Anion gap: 11 (ref 5–15)
BUN: 10 mg/dL (ref 6–20)
CO2: 22 mmol/L (ref 22–32)
Calcium: 8.7 mg/dL — ABNORMAL LOW (ref 8.9–10.3)
Chloride: 101 mmol/L (ref 98–111)
Creatinine, Ser: 0.7 mg/dL (ref 0.44–1.00)
GFR, Estimated: >60 mL/min (ref >60)
Glucose, Bld: 126 mg/dL — ABNORMAL HIGH (ref 70–99)
Potassium: 2.9 mmol/L — ABNORMAL LOW (ref 3.5–5.1)
Sodium: 134 mmol/L — ABNORMAL LOW (ref 135–145)
Total Bilirubin: 0.4 mg/dL (ref 0.3–1.2)
Total Protein: 7.6 g/dL (ref 6.5–8.1)

## 2022-03-20 LAB — CBC
HCT: 41.7 % (ref 36.0–46.0)
Hemoglobin: 14.5 g/dL (ref 12.0–15.0)
MCH: 30.9 pg (ref 26.0–34.0)
MCHC: 34.8 g/dL (ref 30.0–36.0)
MCV: 88.9 fL (ref 80.0–100.0)
Platelets: 193 10*3/uL (ref 150–400)
RBC: 4.69 MIL/uL (ref 3.87–5.11)
RDW: 15.6 % — ABNORMAL HIGH (ref 11.5–15.5)
WBC: 5.5 10*3/uL (ref 4.0–10.5)
nRBC: 0 % (ref 0.0–0.2)

## 2022-03-20 LAB — POC URINE PREG, ED: Preg Test, Ur: NEGATIVE

## 2022-03-20 LAB — RAPID URINE DRUG SCREEN, HOSP PERFORMED
Amphetamines: NOT DETECTED
Barbiturates: NOT DETECTED
Benzodiazepines: NOT DETECTED
Cocaine: NOT DETECTED
Opiates: NOT DETECTED
Tetrahydrocannabinol: NOT DETECTED

## 2022-03-20 LAB — ETHANOL: Alcohol, Ethyl (B): 142 mg/dL — ABNORMAL HIGH (ref <10)

## 2022-03-20 MED ORDER — POTASSIUM CHLORIDE CRYS ER 20 MEQ PO TBCR
40.0000 meq | EXTENDED_RELEASE_TABLET | Freq: Once | ORAL | Status: AC
  Administered 2022-03-20: 40 meq via ORAL
  Filled 2022-03-20: qty 2

## 2022-03-20 MED ORDER — NICOTINE 21 MG/24HR TD PT24
21.0000 mg | MEDICATED_PATCH | Freq: Every day | TRANSDERMAL | Status: DC
  Administered 2022-03-20: 21 mg via TRANSDERMAL
  Filled 2022-03-20: qty 1

## 2022-03-20 MED ORDER — ACETAMINOPHEN 325 MG PO TABS: 650.0000 mg | ORAL_TABLET | ORAL | Status: DC | PRN

## 2022-03-20 MED ORDER — LORAZEPAM 1 MG PO TABS
0.0000 mg | ORAL_TABLET | Freq: Four times a day (QID) | ORAL | Status: DC
  Administered 2022-03-20 – 2022-03-21 (×2): 1 mg via ORAL
  Filled 2022-03-20 (×2): qty 1

## 2022-03-20 MED ORDER — THIAMINE HCL 100 MG/ML IJ SOLN: 100.0000 mg | Freq: Every day | INTRAMUSCULAR | Status: DC

## 2022-03-20 MED ORDER — THIAMINE MONONITRATE 100 MG PO TABS
100.0000 mg | ORAL_TABLET | Freq: Every day | ORAL | Status: DC
  Administered 2022-03-20: 100 mg via ORAL
  Filled 2022-03-20: qty 1

## 2022-03-20 NOTE — ED Provider Notes (Signed)
[facility] EMERGENCY DEPARTMENT Provider Note   CSN: [id number] Arrival date & time: 03/20/22  1506     History {Add pertinent medical, surgical, social history, OB history to HPI:1} Chief Complaint  Patient presents with   Suicidal    Sameka Bagent is a 42 y.o. female.  She has a history of anxiety and alcoholism.  She said she has been drinking more and having more trouble with her depression and anxiety.  She has been self cutting and she took all of the Atarax that she was given a few days ago for her anxiety without any improvement.  She would like to be committed.  She denies any acute medical problems.  The history is provided by the patient.  Mental Health Problem Presenting symptoms: depression, self-mutilation and suicidal thoughts   Degree of incapacity (severity):  Severe Onset quality:  Unable to specify Progression:  Worsening Context: alcohol use   Treatment compliance:  Unable to specify Relieved by:  Nothing Worsened by:  Alcohol Ineffective treatments:  Anti-anxiety medications Associated symptoms: poor judgment   Associated symptoms: no abdominal pain and no chest pain   Risk factors: hx of mental illness        Home Medications Prior to Admission medications   Medication Sig Start Date End Date Taking? Authorizing Provider  albuterol (VENTOLIN HFA) 108 (90 Base) MCG/ACT inhaler Inhale 1-2 puffs into the lungs every 6 (six) hours as needed for wheezing or shortness of breath. Patient not taking: Reported on 01/18/2022 09/12/20   Fay Records, MD  amLODipine (NORVASC) 10 MG tablet Take 10 mg by mouth daily. Patient not taking: Reported on 01/18/2022    [provider]  diphenhydramine-acetaminophen (TYLENOL PM) 25-500 MG TABS tablet Take 2 tablets by mouth at bedtime as needed.    [provider]  famotidine (PEPCID) 20 MG tablet Take 1 tablet (20 mg total) by mouth daily. Patient not taking: Reported on 01/18/2022 07/01/21   Isla Pence,  MD  metoprolol tartrate (LOPRESSOR) 50 MG tablet Take 2 tablets (100 mg total) by mouth 2 (two) times daily. 100 mg BID Patient not taking: Reported on 01/18/2022 08/01/21   Fay Records, MD      Allergies    Wellbutrin [bupropion] and Adhesive [tape]    Review of Systems   Review of Systems  Constitutional:  Negative for fever.  HENT:  Negative for sore throat.   Respiratory:  Negative for shortness of breath.   Cardiovascular:  Negative for chest pain.  Gastrointestinal:  Negative for abdominal pain.  Genitourinary:  Negative for dysuria.  Skin:  Negative for rash.  Psychiatric/Behavioral:  Positive for self-injury and suicidal ideas.     Physical Exam Updated Vital Signs BP (!) 131/95   Pulse (!) 106   Temp 97.7 F (36.5 C) (Oral)   Resp 16   LMP 03/13/2022   SpO2 100%  Physical Exam Vitals and nursing note reviewed.  Constitutional:      General: She is not in acute distress.    Appearance: Normal appearance. She is well-developed.  HENT:     Head: Normocephalic and atraumatic.  Eyes:     Conjunctiva/sclera: Conjunctivae normal.  Cardiovascular:     Rate and Rhythm: Regular rhythm. Tachycardia present.     Heart sounds: No murmur heard. Pulmonary:     Effort: Pulmonary effort is normal. No respiratory distress.     Breath sounds: Normal breath sounds.  Abdominal:     Palpations: Abdomen is soft.  Tenderness: There is no abdominal tenderness.  Musculoskeletal:        General: No deformity.     Cervical back: Neck supple.  Skin:    General: Skin is warm and dry.     Capillary Refill: Capillary refill takes less than 2 seconds.  Neurological:     General: No focal deficit present.     Mental Status: She is alert.     ED Results / Procedures / Treatments   Labs (all labs ordered are listed, but only abnormal results are displayed) Labs Reviewed  COMPREHENSIVE METABOLIC PANEL  ETHANOL  CBC  RAPID URINE DRUG SCREEN, HOSP PERFORMED  POC URINE PREG, ED     EKG None  Radiology No results found.  Procedures Procedures  {Document cardiac monitor, telemetry assessment procedure when appropriate:1}  Medications Ordered in ED Medications - No data to display  ED Course/ Medical Decision Making/ A&P                           Medical Decision Making Amount and/or Complexity of Data Reviewed Labs: ordered.   This patient complains of ***; this involves an extensive number of treatment Options and is a complaint that carries with it a high risk of complications and morbidity. The differential includes ***  I ordered, reviewed and interpreted labs, which included *** I ordered medication *** and reviewed PMP when indicated. I ordered imaging studies which included *** and I independently    visualized and interpreted imaging which showed *** Additional history obtained from *** Previous records obtained and reviewed *** I consulted *** and discussed lab and imaging findings and discussed disposition.  Cardiac monitoring reviewed, *** Social determinants considered, *** Critical Interventions: ***  After the interventions stated above, I reevaluated the patient and found *** Admission and further testing considered, ***   {Document critical care time when appropriate:1} {Document review of labs and clinical decision tools ie heart score, Chads2Vasc2 etc:1}  {Document your independent review of radiology images, and any outside records:1} {Document your discussion with family members, caretakers, and with consultants:1} {Document social determinants of health affecting pt's care:1} {Document your decision making why or why not admission, treatments were needed:1} Final Clinical Impression(s) / ED Diagnoses Final diagnoses:  None    Rx / DC Orders ED Discharge Orders     None

## 2022-03-20 NOTE — BH Assessment (Signed)
Comprehensive Clinical Assessment (CCA) Note  03/20/2022 Hannah Rhodes [id number]  Disposition: Clinical report given to Hannah Georges, NP who stats patient meets criteria for Facility Based Crisis.  Pt currently under review by [facility].  The patient demonstrates the following risk factors for suicide: Chronic risk factors for suicide include: substance use disorder. Acute risk factors for suicide include: unemployment. Protective factors for this patient include: responsibility to others (children, family). Considering these factors, the overall suicide risk at this point appears to be high. Patient is not appropriate for outpatient follow up.  [facility] from 03/20/2022 in [place] [facility] from 03/14/2022 in [place] [facility] from 01/18/2022 in [place] High Risk Moderate Risk No Risk      Hannah Rhodes is a 42 y.o. single female who presents voluntarily to [facility] ED. Pt reports a history of anxiety and depression. Patient states she has been having passive SI, causing her to engage in self harming behaviors. Pt also expresses the desire to detox from alcohol. Pt reports she engaged in cutting for the first time in February and again yesterday. Pt states she has no intention of acting on passive thoughts that she may develop. Pt denies HI, auditory or visual hallucinations. Pt reports she was hospitalized once awhile ago for SI, however she is unable to provide any additional details. Pt says she has been drinking today, however she is unable to provide details on what or how much. Pt states she drinks liquor, mouth wash and rubbing alcohol. Pt denies having access to guns.  Pt is unwilling to specify was stressors she is current experiencing. When asked, Pt states "I just have a lot going on, nothing I want to talk about right now. Pt is unemployed and reports she lives with her mother. Pt has 7 children and states they  have a good relationship. Pt identifies her mother and children as primary supports. Pt denies any history of trauma or abuse. Pt is not receiving any mental health services, however she reports she was prescribed anxiety medication about 2 days ago at the hospital.  Pt states she is in need of treatment to assist with her alcohol use. Pt says if she returns home she will start drinking again.   Chief Complaint:  Chief Complaint  Patient presents with   Suicidal   Addiction Problem   Visit Diagnosis: Suicidal Ideations Alcohol Problem.    CCA Screening, Triage and Referral (STR)  Patient Reported Information How did you hear about [place]? Family/Friend  What Is the Reason for Your Visit/Call Today? Patient reports she has been experiencing passive SI with no plan. Pt state she has been self harming by cutting, once in February and once yesterday. Pt denies currenly being suicidal, AH or VH.  How Long Has This Been Causing You Problems? 1-6 months  What Do You Feel Would Help You the Most Today? Alcohol or Drug Use Treatment; Treatment for Depression or other mood problem   Have You Recently Had Any Thoughts About Hurting Yourself? Yes  Are You Planning to Commit Suicide/Harm Yourself At This time? No   Flowsheet Row ED from 03/20/2022 in [place] from 03/14/2022 in [place] from 01/18/2022 in [place] High Risk Moderate Risk No Risk       Have you Recently Had Thoughts About [place]? No  Are You Planning to  Harm Someone at This Time? No  Explanation: N/A   Have You Used Any Alcohol or Drugs in the Past 24 Hours? Yes  What Did You Use and How Much? Per notes, pt drank a pint of liquor 06/04/2021   Do You Currently Have a Therapist/Psychiatrist? No  Name of Therapist/Psychiatrist:    Have You Been Recently Discharged From Any Office Practice or Programs?  No  Explanation of Discharge From Practice/Program: No data recorded    CCA Screening Triage Referral Assessment Type of Contact: Tele-Assessment  Telemedicine Service Delivery:   Is this Initial or Reassessment?   Date Telepsych consult ordered in [facility]:    Time Telepsych consult ordered in [facility]:    Location of Assessment: [facility]  Provider Location: [facility] Assessment Services   Collateral Involvement: None at this time   Does Patient Have a Stage manager Guardian? No  Legal Guardian Contact Information: No data recorded Copy of Legal Guardianship Form: No data recorded Legal Guardian Notified of Arrival: No data recorded Legal Guardian Notified of Pending Discharge: No data recorded If Minor and Not Living with Parent(s), Who has Custody? N/A  Is CPS involved or ever been involved? Never  Is APS involved or ever been involved? Never   Patient Determined To Be At Risk for Harm To Self or Others Based on Review of Patient Reported Information or Presenting Complaint? Yes, for Self-Harm  Method: No data recorded Availability of Means: No data recorded Intent: No data recorded Notification Required: No data recorded Additional Information for Danger to Others Potential: No data recorded Additional Comments for Danger to Others Potential: No data recorded Are There Guns or Other Weapons in Your Home? No data recorded Types of Guns/Weapons: No data recorded Are These Weapons Safely Secured?                            No data recorded Who Could Verify You Are Able To Have These Secured: No data recorded Do You Have any Outstanding Charges, Pending Court Dates, Parole/Probation? No data recorded Contacted To Inform of Risk of Harm To Self or Others: Family/Significant Other:; Law Enforcement (Pt's family and [employer] are aware)    Does Patient Present under Involuntary Commitment? No    [place] of Residence: [place]   Patient Currently Receiving the Following  Services: Not Receiving Services   Determination of Need: Urgent (48 hours)   Options For Referral: Outpatient Therapy; Medication Management; Other: Comment (Continuous Assessment at [facility])     CCA Biopsychosocial Patient Reported Schizophrenia/Schizoaffective Diagnosis in Past: No   Strengths: Pt shares she has been attempting to obtain services for her mental health needs.   Mental Health Symptoms Depression:   Irritability; Difficulty Concentrating; Increase/decrease in appetite; Change in energy/activity   Duration of Depressive symptoms:    Mania:   None   Anxiety:    Worrying   Psychosis:   None   Duration of Psychotic symptoms:    Trauma:   Avoids reminders of event   Obsessions:   None   Compulsions:   None   Inattention:   None   Hyperactivity/Impulsivity:   None   Oppositional/Defiant Behaviors:   None   Emotional Irregularity:   Mood lability; Potentially harmful impulsivity; Recurrent suicidal behaviors/gestures/threats   Other Mood/Personality Symptoms:   None noted    Mental Status Exam Appearance and self-care  Stature:   Average   Weight:   Average weight  Clothing:   -- (Pt is dressed in hospital scrubs)   Grooming:   Normal   Cosmetic use:   Age appropriate   Posture/gait:   Normal   Motor activity:   Not Remarkable   Sensorium  Attention:   Normal   Concentration:   Normal   Orientation:   X5   Recall/memory:   Normal   Affect and Mood  Affect:   Blunted; Flat   Mood:   Depressed   Relating  Eye contact:   Normal   Facial expression:   Responsive   Attitude toward examiner:   Guarded; Cooperative   Thought and Language  Speech flow:  Clear and Coherent   Thought content:   Appropriate to Mood and Circumstances   Preoccupation:   None   Hallucinations:   None   Organization:   Goal-directed   Company secretary of Knowledge:   Average   Intelligence:    Average   Abstraction:   Normal   Judgement:   Poor   Reality Testing:   Variable   Insight:   Lacking   Decision Making:   Impulsive   Social Functioning  Social Maturity:   Impulsive   Social Judgement:   Normal   Stress  Stressors:   Family conflict; Housing; Grief/losses   Coping Ability:   Exhausted; Overwhelmed; Deficient supports   Skill Deficits:   Decision making; Self-control   Supports:   Family     Religion:    Leisure/Recreation:    Exercise/Diet:     CCA Employment/Education Employment/Work Situation:    Education:     CCA Family/Childhood History Family and Relationship History:    Childhood History:          CCA Substance Use Alcohol/Drug Use:                           ASAM's:  Six Dimensions of Multidimensional Assessment  Dimension 1:  Acute Intoxication and/or Withdrawal Potential:      Dimension 2:  Biomedical Conditions and Complications:      Dimension 3:  Emotional, Behavioral, or Cognitive Conditions and Complications:     Dimension 4:  Readiness to Change:     Dimension 5:  Relapse, Continued use, or Continued Problem Potential:     Dimension 6:  Recovery/Living Environment:     ASAM Severity Score:    ASAM Recommended Level of Treatment:     Substance use Disorder (SUD)    Recommendations for Services/Supports/Treatments:    Discharge Disposition:    DSM5 Diagnoses: Patient Active Problem List   Diagnosis Date Noted   Portal hypertension (HCC) 06/05/2021   Hypophosphatemia 01/28/2020   Hypocalcemia 01/28/2020   COVID    Lactic acidosis 01/27/2020   COPD (chronic obstructive pulmonary disease) (HCC)    Hypertension    Pancreatic cyst 07/22/2019   Chronic abdominal pain 07/22/2019   Abnormal LFTs 07/22/2019   Abnormal MRI of abdomen 07/22/2019   Bile duct stricture 07/22/2019   Common bile duct dilation 07/22/2019   Hepatitis C, chronic (HCC) 05/06/2019   Abnormal  ultrasound of abdomen 05/06/2019   Hepatitis C antibody positive in blood 03/10/2019   Alcoholism (HCC) 03/10/2019   Loss of weight 03/10/2019   SOB (shortness of breath) 10/24/2009   ANXIETY STATE, UNSPECIFIED 10/21/2009   NONDEPENDENT TOBACCO USE DISORDER 10/21/2009   INSOMNIA UNSPECIFIED 10/21/2009   PALPITATIONS 10/21/2009     Referrals to Alternative Service(s): Referred  to Alternative Service(s):   Place:   Date:   Time:    Referred to Alternative Service(s):   Place:   Date:   Time:    Referred to Alternative Service(s):   Place:   Date:   Time:    Referred to Alternative Service(s):   Place:   Date:   Time:     Cleda Clarks, Theresia Majors

## 2022-03-20 NOTE — ED Triage Notes (Signed)
Pt arrives from home due to suicidal ideation.  Pt reports hx of substance abuse, alcoholism and self harm by cutting.  Pt is sad but calm and cooperative.  Pt reports last drink was this am.

## 2022-03-20 NOTE — ED Notes (Signed)
Pt in hospital attire. Clothes in a bag in locker. Pt wanded by security.

## 2022-03-21 ENCOUNTER — Other Ambulatory Visit (HOSPITAL_COMMUNITY)
Admission: EM | Admit: 2022-03-21 | Discharge: 2022-03-27 | Disposition: A | Discharge status: Home / Self Care | Payer: Medicaid Other | Attending: Psychiatry | Admitting: Psychiatry

## 2022-03-21 ENCOUNTER — Encounter (HOSPITAL_COMMUNITY): Payer: Self-pay | Admitting: Family

## 2022-03-21 DIAGNOSIS — F1094 Alcohol use, unspecified with alcohol-induced mood disorder: Secondary | ICD-10-CM | POA: Insufficient documentation

## 2022-03-21 DIAGNOSIS — F17210 Nicotine dependence, cigarettes, uncomplicated: Secondary | ICD-10-CM | POA: Diagnosis not present

## 2022-03-21 DIAGNOSIS — Z6379 Other stressful life events affecting family and household: Secondary | ICD-10-CM | POA: Insufficient documentation

## 2022-03-21 DIAGNOSIS — F1014 Alcohol abuse with alcohol-induced mood disorder: Secondary | ICD-10-CM | POA: Diagnosis present

## 2022-03-21 DIAGNOSIS — Z79899 Other long term (current) drug therapy: Secondary | ICD-10-CM | POA: Diagnosis not present

## 2022-03-21 DIAGNOSIS — Z9152 Personal history of nonsuicidal self-harm: Secondary | ICD-10-CM | POA: Insufficient documentation

## 2022-03-21 LAB — BASIC METABOLIC PANEL
Anion gap: 12 (ref 5–15)
BUN: 5 mg/dL — ABNORMAL LOW (ref 6–20)
CO2: 21 mmol/L — ABNORMAL LOW (ref 22–32)
Calcium: 8.7 mg/dL — ABNORMAL LOW (ref 8.9–10.3)
Chloride: 98 mmol/L (ref 98–111)
Creatinine, Ser: 0.89 mg/dL (ref 0.44–1.00)
GFR, Estimated: >60 mL/min (ref >60)
Glucose, Bld: 160 mg/dL — ABNORMAL HIGH (ref 70–99)
Potassium: 3.3 mmol/L — ABNORMAL LOW (ref 3.5–5.1)
Sodium: 131 mmol/L — ABNORMAL LOW (ref 135–145)

## 2022-03-21 LAB — RESP PANEL BY RT-PCR (RSV, FLU A&B, COVID)  RVPGX2
Influenza A by PCR: NEGATIVE
Influenza B by PCR: NEGATIVE
Resp Syncytial Virus by PCR: NEGATIVE
SARS Coronavirus 2 by RT PCR: NEGATIVE

## 2022-03-21 MED ORDER — LOPERAMIDE HCL 2 MG PO CAPS: 2.0000 mg | ORAL_CAPSULE | ORAL | Status: AC | PRN

## 2022-03-21 MED ORDER — GABAPENTIN 100 MG PO CAPS
100.0000 mg | ORAL_CAPSULE | Freq: Three times a day (TID) | ORAL | Status: DC
  Administered 2022-03-21 – 2022-03-27 (×18): 100 mg via ORAL
  Filled 2022-03-21 (×18): qty 1

## 2022-03-21 MED ORDER — LORAZEPAM 1 MG PO TABS
1.0000 mg | ORAL_TABLET | Freq: Four times a day (QID) | ORAL | Status: AC
  Administered 2022-03-21 (×4): 1 mg via ORAL
  Filled 2022-03-21 (×4): qty 1

## 2022-03-21 MED ORDER — LORAZEPAM 1 MG PO TABS: 1.0000 mg | ORAL_TABLET | Freq: Every day | ORAL | Status: DC

## 2022-03-21 MED ORDER — LORAZEPAM 1 MG PO TABS
1.0000 mg | ORAL_TABLET | Freq: Three times a day (TID) | ORAL | Status: DC
  Administered 2022-03-22: 1 mg via ORAL
  Filled 2022-03-21: qty 1

## 2022-03-21 MED ORDER — ADULT MULTIVITAMIN W/MINERALS CH
1.0000 | ORAL_TABLET | Freq: Every day | ORAL | Status: DC
  Administered 2022-03-21 – 2022-03-27 (×7): 1 via ORAL
  Filled 2022-03-21 (×7): qty 1

## 2022-03-21 MED ORDER — LORAZEPAM 1 MG PO TABS: 1.0000 mg | ORAL_TABLET | Freq: Four times a day (QID) | ORAL | Status: AC | PRN

## 2022-03-21 MED ORDER — NICOTINE 14 MG/24HR TD PT24
14.0000 mg | MEDICATED_PATCH | Freq: Every day | TRANSDERMAL | Status: DC
  Administered 2022-03-21 – 2022-03-27 (×7): 14 mg via TRANSDERMAL
  Filled 2022-03-21 (×7): qty 1

## 2022-03-21 MED ORDER — TRAZODONE HCL 50 MG PO TABS
50.0000 mg | ORAL_TABLET | Freq: Every evening | ORAL | Status: DC | PRN
  Administered 2022-03-25 – 2022-03-26 (×2): 50 mg via ORAL
  Filled 2022-03-21 (×2): qty 1

## 2022-03-21 MED ORDER — THIAMINE HCL 100 MG/ML IJ SOLN
100.0000 mg | Freq: Once | INTRAMUSCULAR | Status: AC
  Administered 2022-03-21: 100 mg via INTRAMUSCULAR
  Filled 2022-03-21: qty 2

## 2022-03-21 MED ORDER — THIAMINE MONONITRATE 100 MG PO TABS
100.0000 mg | ORAL_TABLET | Freq: Every day | ORAL | Status: DC
  Administered 2022-03-22 – 2022-03-27 (×6): 100 mg via ORAL
  Filled 2022-03-21 (×6): qty 1

## 2022-03-21 MED ORDER — POTASSIUM CHLORIDE CRYS ER 20 MEQ PO TBCR
20.0000 meq | EXTENDED_RELEASE_TABLET | Freq: Once | ORAL | Status: AC
  Administered 2022-03-21: 20 meq via ORAL
  Filled 2022-03-21: qty 1

## 2022-03-21 MED ORDER — ACETAMINOPHEN 325 MG PO TABS
650.0000 mg | ORAL_TABLET | Freq: Four times a day (QID) | ORAL | Status: DC | PRN
  Administered 2022-03-21 – 2022-03-26 (×3): 650 mg via ORAL
  Filled 2022-03-21 (×3): qty 2

## 2022-03-21 MED ORDER — HYDROCORTISONE (PERIANAL) 2.5 % EX CREA
TOPICAL_CREAM | Freq: Two times a day (BID) | CUTANEOUS | Status: DC
  Filled 2022-03-21: qty 28.35

## 2022-03-21 MED ORDER — CITALOPRAM HYDROBROMIDE 20 MG PO TABS
20.0000 mg | ORAL_TABLET | Freq: Every day | ORAL | Status: DC
  Administered 2022-03-22 – 2022-03-27 (×6): 20 mg via ORAL
  Filled 2022-03-21 (×6): qty 1

## 2022-03-21 MED ORDER — LORAZEPAM 1 MG PO TABS: 1.0000 mg | ORAL_TABLET | Freq: Two times a day (BID) | ORAL | Status: DC

## 2022-03-21 MED ORDER — MAGNESIUM HYDROXIDE 400 MG/5ML PO SUSP: 30.0000 mL | Freq: Every day | ORAL | Status: DC | PRN

## 2022-03-21 MED ORDER — ALUM & MAG HYDROXIDE-SIMETH 200-200-20 MG/5ML PO SUSP: 30.0000 mL | ORAL | Status: DC | PRN

## 2022-03-21 MED ORDER — HYDROXYZINE HCL 25 MG PO TABS
25.0000 mg | ORAL_TABLET | Freq: Three times a day (TID) | ORAL | Status: DC | PRN
  Administered 2022-03-23: 25 mg via ORAL
  Filled 2022-03-21: qty 1

## 2022-03-21 MED ORDER — ONDANSETRON 4 MG PO TBDP: 4.0000 mg | ORAL_TABLET | Freq: Four times a day (QID) | ORAL | Status: AC | PRN

## 2022-03-21 NOTE — ED Notes (Signed)
PHQ 9 done and placed in basket.

## 2022-03-21 NOTE — ED Provider Notes (Signed)
Facility Based Crisis Admission H&P  Date: 03/21/22 Patient Name: Hannah Rhodes MRN: [id number] Chief Complaint: No chief complaint on file.     Diagnoses:  Final diagnoses:  Alcohol-induced mood disorder ([place])    HPI: Patient presents voluntarily to [facility] behavioral health for walk-in assessment, transferred from [facility] emergency department. Patient is assessed, face-to-face, by nurse practitioner. She is seated in assessment area, no acute distress. Consulted with provider, Dr.  Dwyane Dee, and chart reviewed on 03/21/2022. She is alert and oriented, pleasant and cooperative during assessment.   Hannah Rhodes states "I am an alcoholic, I have been for 9 years."  She endorses consuming an average of 5 drinks per day, ongoing for 9 years.  Most recent alcohol use approximately 24 hours ago.  She denies history of blackout, denies history of alcohol-related seizure, denies history of delirium tremens.  She has not sought substance use treatment in the past.  She denies substance use aside from alcohol and tobacco cigarettes.  Typically smokes between 4 and 5 single cigarettes per day.  Patient reports she typically uses alcohol and does not eat.  She believes she has lost approximately 30 pounds in the last 3 months.  She also reports insomnia, chronic in nature.  She reports feeling passively suicidal intermittently for 6 months.  She states "I am not suicidal now, it is once I get the alcohol in me that I am suicidal."  Recent stressors include patient's 62 year old daughter who was diagnosed with myasthenia gravis in June 2023.  Patient reports her daughter is now "doing good."  Patient reports being primary caregiver and patient's daughter required significant care including "learning to walk again."  Chronic stressors include financial difficulties.  Patient reports she is not prescribed any medications as she has not been able to afford to follow-up with outpatient primary care follow-up for  several years.  Hannah Rhodes typically engages in nonsuicidal self-harm behavior by cutting in order to cope.  She has approximately 5 self-inflicted lacerations, approximately 1 inch in length to each bilateral lower extremity.  Superficial lacerations are clean and dry, open to air.  Patient denies pain, no redness, no inflammation, no drainage noted.  Patient  presents with depressed and anxious mood, congruent affect. She denies suicidal and homicidal ideations. Denies history of suicide attempts.  Patient easily contracts verbally for safety with this Probation officer.  Hannah Rhodes endorses mental health history including anxiety, alcohol use disorder, panic attack and major depressive disorder.  Per medical record distant history of heroin use disorder.  She is not linked with outpatient psychiatry currently.  She does have an upcoming appointment for outpatient [facility].  She denies history of inpatient psychiatric hospitalization.  No family mental health history reported.    Patient has normal speech and behavior.  She denies auditory and visual hallucinations.  Patient is able to converse coherently with goal-directed thoughts and no distractibility or preoccupation.  Denies symptoms of paranoia.  Objectively there is no evidence of psychosis/mania or delusional thinking.  Patient resides in [place] with her mother and her 7 children, ranging in age from 43-4 years old.  Patient's mother currently caring for her children.  She denies access to weapons.  Patient receives Social Security for 2 of her children as their father dad.  She is not employed, no other income.    Patient offered support and encouragement.  Patient confirms she will remain full CODE STATUS.  Reviewed medications and potential side effects including lorazepam, trazodone and hydroxyzine, patient  offered opportunity to ask questions.  She verbalized understanding and agreement with treatment plan   PHQ 2-9:  Flowsheet  Row ED from 06/04/2021 in [facility] EMERGENCY DEPARTMENT  Thoughts that you would be better off dead, or of hurting yourself in some way Not at all  PHQ-9 Total Score 4       Flowsheet Row ED from 03/21/2022 in [facility] ED from 03/20/2022 in [facility] EMERGENCY DEPARTMENT ED from 03/14/2022 in [facility] EMERGENCY DEPARTMENT  C-SSRS RISK CATEGORY High Risk High Risk Moderate Risk        Total Time spent with patient: 30 minutes  Musculoskeletal  Strength & Muscle Tone: within normal limits Gait & Station: normal Patient leans: N/A  Psychiatric Specialty Exam  Presentation General Appearance:  Appropriate for Environment; Casual  Eye Contact: Good  Speech: Clear and Coherent; Normal Rate  Speech Volume: Normal  Handedness: Right   Mood and Affect  Mood: Depressed  Affect: Congruent; Depressed   Thought Process  Thought Processes: Coherent; Goal Directed; Linear  Descriptions of Associations:Intact  Orientation:Full (Time, Place and Person)  Thought Content:Logical; WDL  Diagnosis of Schizophrenia or Schizoaffective disorder in past: No   Hallucinations:Hallucinations: None  Ideas of Reference:None  Suicidal Thoughts:Suicidal Thoughts: No  Homicidal Thoughts:Homicidal Thoughts: No   Sensorium  Memory: Immediate Good; Recent Good  Judgment: Fair  Insight: Fair   Art therapist  Concentration: Good  Attention Span: Good  Recall: Good  Fund of Knowledge: Good  Language: Good   Psychomotor Activity  Psychomotor Activity: Psychomotor Activity: Normal   Assets  Assets: Communication Skills; Desire for Improvement; Financial Resources/Insurance; Housing; Intimacy; Leisure Time; Physical Health; Social Support; Resilience   Sleep  Sleep: Sleep: Fair   Nutritional Assessment (For OBS and FBC admissions only) Has the patient had a weight loss or gain of 10 pounds or more in the last 3  months?: Yes Has the patient had a decrease in food intake/or appetite?: Yes Does the patient have dental problems?: No Does the patient have eating habits or behaviors that may be indicators of an eating disorder including binging or inducing vomiting?: No Has the patient recently lost weight without trying?: 3 Has the patient been eating poorly because of a decreased appetite?: 1 Malnutrition Screening Tool Score: 4 Nutritional Assessment Referrals: Medication/Tx changes    Physical Exam Vitals and nursing note reviewed.  Constitutional:      Appearance: Normal appearance. She is well-developed.  HENT:     Head: Normocephalic and atraumatic.     Nose: Nose normal.  Cardiovascular:     Rate and Rhythm: Normal rate.  Pulmonary:     Effort: Pulmonary effort is normal.  Musculoskeletal:        General: Normal range of motion.     Cervical back: Normal range of motion.  Skin:    General: Skin is warm and dry.     Findings: Laceration present.     Comments: Approximately 5, self-inflicted 1 inch lacerations to each lower leg.  Neurological:     Mental Status: She is alert and oriented to person, place, and time.  Psychiatric:        Attention and Perception: Attention and perception normal.        Mood and Affect: Affect normal. Mood is anxious and depressed.        Speech: Speech normal.        Behavior: Behavior normal. Behavior is cooperative.  Thought Content: Thought content normal.        Cognition and Memory: Cognition normal.    Review of Systems  Constitutional: Negative.   HENT: Negative.    Eyes: Negative.   Respiratory: Negative.    Cardiovascular: Negative.   Gastrointestinal: Negative.   Genitourinary: Negative.   Musculoskeletal: Negative.   Skin: Negative.   Neurological: Negative.   Psychiatric/Behavioral:  Positive for depression and substance abuse. The patient is nervous/anxious.     Blood pressure (!) 142/98, pulse 95, temperature 98.3 F  (36.8 C), temperature source Oral, resp. rate 18, last menstrual period 03/13/2022, SpO2 100 %. There is no height or weight on file to calculate BMI.  Past Psychiatric History: see above  Is the patient at risk to self? No  Has the patient been a risk to self in the past 6 months? No .    Has the patient been a risk to self within the distant past? No   Is the patient a risk to others? No   Has the patient been a risk to others in the past 6 months? No   Has the patient been a risk to others within the distant past? No   Past Medical History:  Past Medical History:  Diagnosis Date   Alcoholism (HCC)    Anxiety    Chronic hepatitis C (HCC)    Coma (HCC)    while pregnant   COPD (chronic obstructive pulmonary disease) (HCC)    no inaler   DDD (degenerative disc disease), lumbar    Fatty liver    Generalized headaches    GERD (gastroesophageal reflux disease)    diet controlled, no med   Hepatitis C    Heroin use    HPV (human papilloma virus) infection    Hypertension    Insomnia    Migraine    Panic attack    Pneumonia 03/2010   Smoker    Tachycardia     Past Surgical History:  Procedure Laterality Date   BIOPSY  09/28/2019   Procedure: BIOPSY;  Surgeon: Lemar LoftyMansouraty, Gabriel Jr., MD;  Location: [facility] ENDOSCOPY;  Service: Gastroenterology;;   CESAREAN SECTION     CESAREAN SECTION     ESOPHAGOGASTRODUODENOSCOPY (EGD) WITH PROPOFOL N/A 09/28/2019   Procedure: ESOPHAGOGASTRODUODENOSCOPY (EGD) WITH PROPOFOL;  Surgeon: Lemar LoftyMansouraty, Gabriel Jr., MD;  [facility];  Service: Gastroenterology;  Laterality: N/A;   EUS  09/28/2019   Procedure: UPPER ENDOSCOPIC ULTRASOUND (EUS) LINEAR;  Surgeon: Lemar LoftyMansouraty, Gabriel Jr., MD;  [facility];  Service: Gastroenterology;;   EUS  09/28/2019   Procedure: FULL UPPER ENDOSCOPIC ULTRASOUND (EUS) RADIAL;  Surgeon: Lemar LoftyMansouraty, Gabriel Jr., MD;  [facility];  Service: Gastroenterology;;   FINE NEEDLE ASPIRATION   09/28/2019   Procedure: FINE NEEDLE ASPIRATION (FNA) LINEAR;  Surgeon: Lemar LoftyMansouraty, Gabriel Jr., MD;  [facility];  Service: Gastroenterology;;   SMALL INTESTINE SURGERY     WISDOM TOOTH EXTRACTION      Family History:  Family History  Problem Relation Age of Onset   Heart failure Mother    CAD Mother    Heart attack Mother    Anxiety disorder Sister    Restless legs syndrome Sister    Dementia Maternal Grandmother    Dementia Maternal Grandfather    Colon cancer Neg Hx    Esophageal cancer Neg Hx    Inflammatory bowel disease Neg Hx    Liver disease Neg Hx    Pancreatic cancer Neg Hx    Stomach  cancer Neg Hx    Rectal cancer Neg Hx     Social History:  Social History   Socioeconomic History   Marital status: Single    Spouse name: Not on file   Number of children: 7   Years of education: Not on file   Highest education level: Not on file  Occupational History   Occupation: Homemaker  Tobacco Use   Smoking status: Every Day    Packs/day: 0.50    Years: 24.00    Total pack years: 12.00    Types: Cigarettes   Smokeless tobacco: Never   Tobacco comments:    Cut back to 5 cig/day  Vaping Use   Vaping Use: Never used  Substance and Sexual Activity   Alcohol use: Yes    Comment: daily , beer, liquor and wine   Drug use: Not Currently    Types: Marijuana, Heroin, Oxycodone, Hydrocodone, Cocaine    Comment: nothing at this time (last month was last use 07/01/21)   Sexual activity: Not Currently  Other Topics Concern   Not on file  Social History Narrative   Not on file   Social Determinants of Health   Financial Resource Strain: Not on file  Food Insecurity: Not on file  Transportation Needs: Not on file  Physical Activity: Not on file  Stress: Not on file  Social Connections: Not on file  Intimate Partner Violence: Not on file    SDOH:  SDOH Screenings   Depression (PHQ2-9): Low Risk  (06/05/2021)  Tobacco Use: High Risk (03/20/2022)    Last  Labs:  Admission on 03/20/2022, Discharged on 03/21/2022  Component Date Value Ref Range Status   Sodium 03/20/2022 134 (L)  135 - 145 mmol/L Final   Potassium 03/20/2022 2.9 (L)  3.5 - 5.1 mmol/L Final   Chloride 03/20/2022 101  98 - 111 mmol/L Final   CO2 03/20/2022 22  22 - 32 mmol/L Final   Glucose, Bld 03/20/2022 126 (H)  70 - 99 mg/dL Final   Glucose reference range applies only to samples taken after fasting for at least 8 hours.   BUN 03/20/2022 10  6 - 20 mg/dL Final   Creatinine, Ser 03/20/2022 0.70  0.44 - 1.00 mg/dL Final   Calcium 71/24/5809 8.7 (L)  8.9 - 10.3 mg/dL Final   Total Protein 98/33/8250 7.6  6.5 - 8.1 g/dL Final   Albumin 53/97/6734 4.2  3.5 - 5.0 g/dL Final   AST 19/37/9024 65 (H)  15 - 41 U/L Final   ALT 03/20/2022 22  0 - 44 U/L Final   Alkaline Phosphatase 03/20/2022 100  38 - 126 U/L Final   Total Bilirubin 03/20/2022 0.4  0.3 - 1.2 mg/dL Final   GFR, Estimated 03/20/2022 >60  >60 mL/min Final   Comment: (NOTE) Calculated using the CKD-EPI Creatinine Equation (2021)    Anion gap 03/20/2022 11  5 - 15 Final   Performed at [facility], [address], [place], [place] [address]   Alcohol, Ethyl (B) 03/20/2022 142 (H)  <10 mg/dL Final   Comment: (NOTE) Lowest detectable limit for serum alcohol is 10 mg/dL.  For medical purposes only. Performed at [facility], [address] [place], [place] [address]    WBC 03/20/2022 5.5  4.0 - 10.5 K/uL Final   RBC 03/20/2022 4.69  3.87 - 5.11 MIL/uL Final   Hemoglobin 03/20/2022 14.5  12.0 - 15.0 g/dL Final   HCT 42/68/3419 41.7  36.0 - 46.0 % Final  MCV 03/20/2022 88.9  80.0 - 100.0 fL Final   MCH 03/20/2022 30.9  26.0 - 34.0 pg Final   MCHC 03/20/2022 34.8  30.0 - 36.0 g/dL Final   RDW 03/20/2022 15.6 (H)  11.5 - 15.5 % Final   Platelets 03/20/2022 193  150 - 400 K/uL Final   nRBC 03/20/2022 0.0  0.0 - 0.2 % Final   Performed at [facility], [address] [place], [place] [address]   Opiates 03/20/2022  NONE DETECTED  NONE DETECTED Final   Cocaine 03/20/2022 NONE DETECTED  NONE DETECTED Final   Benzodiazepines 03/20/2022 NONE DETECTED  NONE DETECTED Final   Amphetamines 03/20/2022 NONE DETECTED  NONE DETECTED Final   Tetrahydrocannabinol 03/20/2022 NONE DETECTED  NONE DETECTED Final   Barbiturates 03/20/2022 NONE DETECTED  NONE DETECTED Final   Comment: (NOTE) DRUG SCREEN FOR MEDICAL PURPOSES ONLY.  IF CONFIRMATION IS NEEDED FOR ANY PURPOSE, NOTIFY LAB WITHIN 5 DAYS.  LOWEST DETECTABLE LIMITS FOR URINE DRUG SCREEN Drug Class                     Cutoff (ng/mL) Amphetamine and metabolites    1000 Barbiturate and metabolites    200 Benzodiazepine                 200 Opiates and metabolites        300 Cocaine and metabolites        300 THC                            50 Performed at [facility], [address] [place], [place] [address]    Preg Test, Ur 03/20/2022 Negative  Negative Final   SARS Coronavirus 2 by RT PCR 03/21/2022 NEGATIVE  NEGATIVE Final   Comment: (NOTE) SARS-CoV-2 target nucleic acids are NOT DETECTED.  The SARS-CoV-2 RNA is generally detectable in upper respiratory specimens during the acute phase of infection. The lowest concentration of SARS-CoV-2 viral copies this assay can detect is 138 copies/mL. A negative result does not preclude SARS-Cov-2 infection and should not be used as the sole basis for treatment or other patient management decisions. A negative result may occur with  improper specimen collection/handling, submission of specimen other than nasopharyngeal swab, presence of viral mutation(s) within the areas targeted by this assay, and inadequate number of viral copies(<138 copies/mL). A negative result must be combined with clinical observations, patient history, and epidemiological information. The expected result is Negative.  Fact Sheet for Patients:  EntrepreneurPulse.com.au  Fact Sheet for Healthcare Providers:   IncredibleEmployment.be  This test is no                          t yet approved or cleared by the [place] FDA and  has been authorized for detection and/or diagnosis of SARS-CoV-2 by FDA under an Emergency Use Authorization (EUA). This EUA will remain  in effect (meaning this test can be used) for the duration of the COVID-19 declaration under Section 564(b)(1) of the Act, 21 U.S.C.section 360bbb-3(b)(1), unless the authorization is terminated  or revoked sooner.       Influenza A by PCR 03/21/2022 NEGATIVE  NEGATIVE Final   Influenza B by PCR 03/21/2022 NEGATIVE  NEGATIVE Final   Comment: (NOTE) The Xpert Xpress SARS-CoV-2/FLU/RSV plus assay is intended as an aid in the diagnosis of influenza from Nasopharyngeal swab specimens and should not be used  as a sole basis for treatment. Nasal washings and aspirates are unacceptable for Xpert Xpress SARS-CoV-2/FLU/RSV testing.  Fact Sheet for Patients: BloggerCourse.comhttps://www.fda.gov/media/152166/download  Fact Sheet for Healthcare Providers: SeriousBroker.ithttps://www.fda.gov/media/152162/download  This test is not yet approved or cleared by the [place] FDA and has been authorized for detection and/or diagnosis of SARS-CoV-2 by FDA under an Emergency Use Authorization (EUA). This EUA will remain in effect (meaning this test can be used) for the duration of the COVID-19 declaration under Section 564(b)(1) of the Act, 21 U.S.C. section 360bbb-3(b)(1), unless the authorization is terminated or revoked.     Resp Syncytial Virus by PCR 03/21/2022 NEGATIVE  NEGATIVE Final   Comment: (NOTE) Fact Sheet for Patients: BloggerCourse.comhttps://www.fda.gov/media/152166/download  Fact Sheet for Healthcare Providers: SeriousBroker.ithttps://www.fda.gov/media/152162/download  This test is not yet approved or cleared by the [place] FDA and has been authorized for detection and/or diagnosis of SARS-CoV-2 by FDA under an Emergency Use Authorization (EUA). This EUA  will remain in effect (meaning this test can be used) for the duration of the COVID-19 declaration under Section 564(b)(1) of the Act, 21 U.S.C. section 360bbb-3(b)(1), unless the authorization is terminated or revoked.  Performed at [facility], [address] [place], [place] [address]   Admission on 01/18/2022, Discharged on 01/18/2022  Component Date Value Ref Range Status   WBC 01/18/2022 5.9  4.0 - 10.5 K/uL Final   RBC 01/18/2022 4.87  3.87 - 5.11 MIL/uL Final   Hemoglobin 01/18/2022 14.5  12.0 - 15.0 g/dL Final   HCT 60/45/409811/04/2021 42.3  36.0 - 46.0 % Final   MCV 01/18/2022 86.9  80.0 - 100.0 fL Final   MCH 01/18/2022 29.8  26.0 - 34.0 pg Final   MCHC 01/18/2022 34.3  30.0 - 36.0 g/dL Final   RDW 11/91/478211/04/2021 17.6 (H)  11.5 - 15.5 % Final   Platelets 01/18/2022 348  150 - 400 K/uL Final   nRBC 01/18/2022 0.0  0.0 - 0.2 % Final   Performed at [facility], [address] [place], [place] [address]   Troponin I (High Sensitivity) 01/18/2022 3  <18 ng/L Final   Comment: (NOTE) Elevated high sensitivity troponin I (hsTnI) values and significant  changes across serial measurements may suggest ACS but many other  chronic and acute conditions are known to elevate hsTnI results.  Refer to the "Links" section for chest pain algorithms and additional  guidance. Performed at [facility], [address] [place], [place] [address]    Sodium 01/18/2022 139  135 - 145 mmol/L Final   Potassium 01/18/2022 3.6  3.5 - 5.1 mmol/L Final   Chloride 01/18/2022 104  98 - 111 mmol/L Final   CO2 01/18/2022 17 (L)  22 - 32 mmol/L Final   Glucose, Bld 01/18/2022 72  70 - 99 mg/dL Final   Glucose reference range applies only to samples taken after fasting for at least 8 hours.   BUN 01/18/2022 11  6 - 20 mg/dL Final   Creatinine, Ser 01/18/2022 0.90  0.44 - 1.00 mg/dL Final   Calcium 78/46/962911/04/2021 8.8 (L)  8.9 - 10.3 mg/dL Final   Total Protein 52/84/132411/04/2021 7.5  6.5 - 8.1 g/dL Final   Albumin 40/10/272511/04/2021  3.9  3.5 - 5.0 g/dL Final   AST 36/64/403411/04/2021 35  15 - 41 U/L Final   ALT 01/18/2022 19  0 - 44 U/L Final   Alkaline Phosphatase 01/18/2022 86  38 - 126 U/L Final   Total Bilirubin 01/18/2022 0.4  0.3 - 1.2 mg/dL Final  GFR, Estimated 01/18/2022 >60  >60 mL/min Final   Comment: (NOTE) Calculated using the CKD-EPI Creatinine Equation (2021)    Anion gap 01/18/2022 18 (H)  5 - 15 Final   Performed at [facility], [address], [place], [place] [address]   Troponin I (High Sensitivity) 01/18/2022 4  <18 ng/L Final   Comment: (NOTE) Elevated high sensitivity troponin I (hsTnI) values and significant  changes across serial measurements may suggest ACS but many other  chronic and acute conditions are known to elevate hsTnI results.  Refer to the "Links" section for chest pain algorithms and additional  guidance. Performed at [facility], [address], [place], [place] [address]     Allergies: Wellbutrin [bupropion] and Adhesive [tape]  PTA Medications: (Not in a hospital admission)   Long Term Goals: Improvement in symptoms so as ready for discharge  Short Term Goals: Patient will verbalize feelings in meetings with treatment team members., Patient will attend at least of 50% of the groups daily., Pt will complete the PHQ9 on admission, day 3 and discharge., Patient will participate in completing the [place] Suicide Severity Rating Scale, Patient will score a low risk of violence for 24 hours prior to discharge, and Patient will take medications as prescribed daily.  Medical Decision Making  Patient remains voluntary, she agrees with plan for admission to facility based crisis unit.  Ultimately patient would like to be discharged to residential substance use treatment.  Labs collected/reviewed 03/20/2022 CMP- K+ 2.9- replaced Pregnancy- negative UDS - negative Ethanol less than 10 Respiratory Panel-influenza A and B negative, COVID negative -CBC ordered  EKG completed  03/21/2022 QT/QTc measures, prolonged QTc 394/497 -EKG ordered  Current medications: -Acetaminophen 650 mg every 6 as needed/mild pain -Maalox 30 mL oral every 4 as needed/digestion -Hydroxyzine 25 mg 3 times daily as needed/anxiety -Magnesium hydroxide 30 mL daily as needed/mild constipation -Trazodone 50 mg nightly as needed/sleep -Nicotine NicoDerm transdermal 14 mg daily/nicotine withdrawal  CIWA Ativan protocol initiated: -Loperamide 2 to 4 mg oral as needed/diarrhea or loose stools -Lorazepam 1 mg 4 times daily x4 doses, 1 mg 3 times daily x3 doses, 1 mg 2 times daily x2 doses, 1 mg daily x1 dose -Lorazepam 1 mg every 6 hours as needed CIWA greater than 10 -Multivitamin with minerals 1 tablet daily -Ondansetron disintegrating tablet 4 mg every 6 as needed/nausea or vomiting -Thiamine injection 100 mg IM once -Thiamine tablet 100 mg daily    Recommendations  Based on my evaluation the patient does not appear to have an emergency medical condition.  Lenard Lance, FNP 03/21/22  10:14 AM

## 2022-03-21 NOTE — ED Notes (Signed)
Patient watching TV in day room. Respirations equal and unlabored, skin warm and dry, NAD. No change in assessment or acuity. Q 15 minute safety checks remain in place.   

## 2022-03-21 NOTE — Treatment Plan (Signed)
LCSW met with patient to assess current mood, affect, physical state, and inquire about needs/goals while here in [facility] and after discharge. Patient reports she presented due to needing to detox from alcohol. Patient reports she has been using alcohol for about 9 years off and on. Patient reports an increase in her use about 2-3 months ago. Patient reports prior to increase she would drink a shot of liquor "here and there", and have a beer "here and there". Patient reports she now drinks on average about 2 beers and 2 shots a day. Patient reports she lives at home with her mother and seven children ranging from the ages of 23 to 53 years old. Patient reports her children are currently at home with her mother, and patient reports her home as a safe environment. 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 Patient has identify this as a triggering factor for her current alcohol use. Patient reports she has attempted to receive outpatient treatment in the past via [facility] in [place]. However reports she has not been able to due to caring for her daughter's needs. Patient reports her current goal is to detox at this time, and then follow up outpatient with [facility] in [place] to address her substance use and mental health. Patient reports she has access to transportation, however she does not drive. Patient reports a good amount of support from her mother and children. Patient is aware that LCSW will make an attempt to contact [facility] to schedule an appointment for follow up. Patient expressed understanding and appreciation for LCSW assistance.   LCSW will continue to follow and provide support to patient while on [facility].   Lucius Conn, LCSW Clinical Social Worker [facility] Ph: [phone]

## 2022-03-21 NOTE — Progress Notes (Signed)
Patient accepted to [facility] pending negative COVID result.  Accepting will be Hampton Abbot MD. When negative COVID result obtained and transportation arranged, report may be called to [phone].

## 2022-03-21 NOTE — ED Notes (Signed)
Patient in dayroom eating lunch. Respirations equal and unlabored, skin warm and dry, NAD. No change in assessment or acuity. Q 15 minute safety checks remain in place.

## 2022-03-21 NOTE — ED Notes (Signed)
Pt asleep at this hour. No apparent distress. RR even and unlabored. Monitored for safety.  

## 2022-03-21 NOTE — ED Notes (Signed)
Patient A&Ox4. Patient currently denies SI/HI and AVH. However, Patient has reported SI thoughts intermitted  over the last month. Patient has also reported Self harm behavior. Patient is noted to have scabbed over superficial laceration to lower inner legs. No redness or drainage or signs of infection noted. Patient denies any physical complaints when asked. No acute distress noted. Support and encouragement provided. Routine safety checks conducted according to facility protocol. Encouraged patient to notify staff if thoughts of harm toward self or others arise. Patient verbalize understanding and agreement. Will continue to monitor for safety. Q 15 min safety checks remain in place.

## 2022-03-21 NOTE — ED Notes (Signed)
Snacks given 

## 2022-03-21 NOTE — ED Provider Notes (Signed)
  Provider Note MRN:  [id number]  Arrival date & time: 03/21/22    ED Course and Medical Decision Making  Assumed care from default provider at shift change.  Accepted for transfer, medically cleared.  Procedures  Final Clinical Impressions(s) / ED Diagnoses     ICD-10-CM   1. Depression, unspecified depression type  F32.A       ED Discharge Orders     None       Discharge Instructions   None     Barth Kirks. [place] Small, [place] [email or web address]    Maudie Flakes, MD 03/21/22 [phone]

## 2022-03-21 NOTE — ED Notes (Signed)
Pt sitting in lounge talking to another patient at this hour. No apparent distress. RR even and unlabored. Monitored for safety.

## 2022-03-22 DIAGNOSIS — F17210 Nicotine dependence, cigarettes, uncomplicated: Secondary | ICD-10-CM | POA: Diagnosis not present

## 2022-03-22 DIAGNOSIS — Z9152 Personal history of nonsuicidal self-harm: Secondary | ICD-10-CM | POA: Diagnosis not present

## 2022-03-22 DIAGNOSIS — Z79899 Other long term (current) drug therapy: Secondary | ICD-10-CM | POA: Diagnosis not present

## 2022-03-22 DIAGNOSIS — F1094 Alcohol use, unspecified with alcohol-induced mood disorder: Secondary | ICD-10-CM | POA: Diagnosis not present

## 2022-03-22 LAB — HEPATITIS PANEL, ACUTE
HCV Ab: REACTIVE — AB
Hep A IgM: NONREACTIVE
Hep B C IgM: NONREACTIVE
Hepatitis B Surface Ag: NONREACTIVE

## 2022-03-22 LAB — POTASSIUM: Potassium: 4 mmol/L (ref 3.5–5.1)

## 2022-03-22 MED ORDER — CHLORDIAZEPOXIDE HCL 25 MG PO CAPS
25.0000 mg | ORAL_CAPSULE | Freq: Four times a day (QID) | ORAL | Status: AC
  Administered 2022-03-22 (×4): 25 mg via ORAL
  Filled 2022-03-22 (×4): qty 1

## 2022-03-22 MED ORDER — THIAMINE MONONITRATE 100 MG PO TABS: 100.0000 mg | ORAL_TABLET | Freq: Every day | ORAL | Status: DC

## 2022-03-22 MED ORDER — CHLORDIAZEPOXIDE HCL 25 MG PO CAPS
25.0000 mg | ORAL_CAPSULE | Freq: Three times a day (TID) | ORAL | Status: AC
  Administered 2022-03-23 (×3): 25 mg via ORAL
  Filled 2022-03-22 (×3): qty 1

## 2022-03-22 MED ORDER — CHLORDIAZEPOXIDE HCL 25 MG PO CAPS
25.0000 mg | ORAL_CAPSULE | ORAL | Status: AC
  Administered 2022-03-24 (×2): 25 mg via ORAL
  Filled 2022-03-22 (×2): qty 1

## 2022-03-22 MED ORDER — CHLORDIAZEPOXIDE HCL 25 MG PO CAPS
25.0000 mg | ORAL_CAPSULE | Freq: Every day | ORAL | Status: AC
  Administered 2022-03-25: 25 mg via ORAL
  Filled 2022-03-22: qty 1

## 2022-03-22 NOTE — ED Notes (Signed)
Pt is in the dayroom watching TV.  Respirations are even and unlabored. No acute distress noted. Will continue to monitor for safety. 

## 2022-03-22 NOTE — Group Note (Signed)
Group Topic: Recovery Basics  Group Date: 03/22/2022 Start Time: 1000 End Time: 1030 Facilitators: Mervyn Skeeters D, NT  Department: [facility]  Number of Participants: 5  Group Focus: relapse prevention Treatment Modality:  Psychoeducation Interventions utilized were assignment Purpose: relapse prevention strategies  Name: Hannah Rhodes Date of Birth: 09-Jul-1980  MR: [id number]    Level of Participation: moderate Quality of Participation: cooperative Interactions with others: gave feedback Mood/Affect: appropriate Triggers (if applicable): n/a Cognition: coherent/clear Progress: Moderate Response: n/a Plan: follow-up needed  Patients Problems:  Patient Active Problem List   Diagnosis Date Noted   Alcohol abuse with alcohol-induced mood disorder ([place]) 03/21/2022   Portal hypertension ([place]) 06/05/2021   Hypophosphatemia 01/28/2020   Hypocalcemia 01/28/2020   COVID    Lactic acidosis 01/27/2020   COPD (chronic obstructive pulmonary disease) ([place])    Hypertension    Pancreatic cyst 07/22/2019   Chronic abdominal pain 07/22/2019   Abnormal LFTs 07/22/2019   Abnormal MRI of abdomen 07/22/2019   Bile duct stricture 07/22/2019   Common bile duct dilation 07/22/2019   Hepatitis C, chronic ([place]) 05/06/2019   Abnormal ultrasound of abdomen 05/06/2019   Hepatitis C antibody positive in blood 03/10/2019   Alcoholism ([place]) 03/10/2019   Loss of weight 03/10/2019   SOB (shortness of breath) 10/24/2009   ANXIETY STATE, UNSPECIFIED 10/21/2009   NONDEPENDENT TOBACCO USE DISORDER 10/21/2009   INSOMNIA UNSPECIFIED 10/21/2009   PALPITATIONS 10/21/2009

## 2022-03-22 NOTE — Discharge Planning (Signed)
LCSW contacted [facility] in [place], [place] to arrange an aftercare follow up appointment for substance abuse and mental health services. Appointment has been scheduled for March 28, 2022 at 9:00am. Appointment information has been updated in AVS for her review. Patient also made aware of appointment and no concerns were reported.   LCSW will continue to follow and provide support to patient while on FBC unit.   Lucius Conn, LCSW Clinical Social Worker [facility] Ph: [phone]

## 2022-03-22 NOTE — ED Notes (Signed)
Pt is in the bed sleeping. Respirations are even and unlabored. No acute distress noted. Will continue to monitor for safety. 

## 2022-03-22 NOTE — ED Notes (Signed)
Patient sitting in dayroom watching TV with peers appropriately. Respirations equal and unlabored, skin warm and dry, NAD. No change in assessment or acuity. Q 15 minute safety checks remain in place.    

## 2022-03-22 NOTE — ED Notes (Signed)
Patient sitting in dayroom watching appropriately with peers.  Respirations equal and unlabored, skin warm and dry, NAD. No change in assessment or acuity. Q 15 minute safety checks remain in place. 

## 2022-03-22 NOTE — ED Notes (Signed)
Pt asleep at this hour. No apparent distress. RR even and unlabored. Monitored for safety.  

## 2022-03-22 NOTE — ED Notes (Signed)
Gave pt.a snack 

## 2022-03-22 NOTE — ED Notes (Signed)
Patient A&Ox4. Patient denies SI/HI and AVH. Patient reports feeling bad from withdrawal symptoms.  No acute distress noted. Support and encouragement provided. Routine safety checks conducted according to facility protocol. Encouraged patient to notify staff if thoughts of harm toward self or others arise. Patient verbalize understanding and agreement. Will continue to monitor for safety. Q 15 min safety checks remain in place.

## 2022-03-22 NOTE — ED Provider Notes (Signed)
Behavioral Health Progress Note  Date and Time: 03/22/2022 11:59 AM Name: Hannah Rhodes MRN:  [id number]  Subjective:   Hannah Rhodes is a 42 year old female with a history of alcohol use disorder.  She is a mother of 7 children who she cares for.  She presented to the [facility] behavioral urgent care complaining of increasing anxiety depression and alcohol use.  She was admitted to the facility based crisis for help with her alcohol use disorder.  She was experiencing some alcohol withdrawal and was started on Ativan taper which has since been switched to Librium.  The patient declined residential rehab placement and stated that she wanted to follow-up outpatient.  LCSW got her an appointment with [facility] in [place] for January 10.  The patient will be able to access intensive outpatient resources should she so desire after that appointment.  The patient will be discharged after her detox is complete.  The patient denies auditory/visual hallucinations and first rank symptoms.  The patient reports good mood, appetite, and sleep. They deny suicidal and homicidal thoughts. The patient denies side effects from their medications.  Review of systems as below. The patient reports experiencing migraine and anxiety related to withdrawal from alcohol.  She states is her typical pattern of withdrawal.  Discussed with her lab testing for hepatitis and rechecking her potassium.   Diagnosis:  Final diagnoses:  Alcohol-induced mood disorder (HCC)    Total Time spent with patient: 20 minutes  Past Psychiatric History: as above Past Medical History:  Past Medical History:  Diagnosis Date   Alcoholism (HCC)    Anxiety    Chronic hepatitis C (HCC)    Coma (HCC)    while pregnant   COPD (chronic obstructive pulmonary disease) (HCC)    no inaler   DDD (degenerative disc disease), lumbar    Fatty liver    Generalized headaches    GERD (gastroesophageal reflux disease)    diet controlled, no med    Hepatitis C    Heroin use    HPV (human papilloma virus) infection    Hypertension    Insomnia    Migraine    Panic attack    Pneumonia 03/2010   Smoker    Tachycardia     Past Surgical History:  Procedure Laterality Date   BIOPSY  09/28/2019   Procedure: BIOPSY;  Surgeon: Lemar Lofty., MD;  Location: [facility] ENDOSCOPY;  Service: Gastroenterology;;   CESAREAN SECTION     CESAREAN SECTION     ESOPHAGOGASTRODUODENOSCOPY (EGD) WITH PROPOFOL N/A 09/28/2019   Procedure: ESOPHAGOGASTRODUODENOSCOPY (EGD) WITH PROPOFOL;  Surgeon: Lemar Lofty., MD;  Location: [facility] ENDOSCOPY;  Service: Gastroenterology;  Laterality: N/A;   EUS  09/28/2019   Procedure: UPPER ENDOSCOPIC ULTRASOUND (EUS) LINEAR;  Surgeon: Lemar Lofty., MD;  Location: [facility] ENDOSCOPY;  Service: Gastroenterology;;   EUS  09/28/2019   Procedure: FULL UPPER ENDOSCOPIC ULTRASOUND (EUS) RADIAL;  Surgeon: Lemar Lofty., MD;  Location: [facility] ENDOSCOPY;  Service: Gastroenterology;;   FINE NEEDLE ASPIRATION  09/28/2019   Procedure: FINE NEEDLE ASPIRATION (FNA) LINEAR;  Surgeon: Lemar Lofty., MD;  Location: [facility] ENDOSCOPY;  Service: Gastroenterology;;   SMALL INTESTINE SURGERY     WISDOM TOOTH EXTRACTION     Family History:  Family History  Problem Relation Age of Onset   Heart failure Mother    CAD Mother    Heart attack Mother    Anxiety disorder Sister    Restless legs syndrome Sister    Dementia Maternal  Grandmother    Dementia Maternal Grandfather    Colon cancer Neg Hx    Esophageal cancer Neg Hx    Inflammatory bowel disease Neg Hx    Liver disease Neg Hx    Pancreatic cancer Neg Hx    Stomach cancer Neg Hx    Rectal cancer Neg Hx    Family Psychiatric  History: none petinent Social History:  Social History   Substance and Sexual Activity  Alcohol Use Yes   Comment: daily , beer, liquor and wine     Social History   Substance and Sexual Activity  Drug Use Not Currently    Types: Marijuana, Heroin, Oxycodone, Hydrocodone, Cocaine   Comment: nothing at this time (last month was last use 07/01/21)    Social History   Socioeconomic History   Marital status: Single    Spouse name: Not on file   Number of children: 7   Years of education: Not on file   Highest education level: Not on file  Occupational History   Occupation: Homemaker  Tobacco Use   Smoking status: Every Day    Packs/day: 0.50    Years: 24.00    Total pack years: 12.00    Types: Cigarettes   Smokeless tobacco: Never   Tobacco comments:    Cut back to 5 cig/day  Vaping Use   Vaping Use: Never used  Substance and Sexual Activity   Alcohol use: Yes    Comment: daily , beer, liquor and wine   Drug use: Not Currently    Types: Marijuana, Heroin, Oxycodone, Hydrocodone, Cocaine    Comment: nothing at this time (last month was last use 07/01/21)   Sexual activity: Not Currently  Other Topics Concern   Not on file  Social History Narrative   Not on file   Social Determinants of Health   Financial Resource Strain: Not on file  Food Insecurity: Not on file  Transportation Needs: Not on file  Physical Activity: Not on file  Stress: Not on file  Social Connections: Not on file   SDOH:  SDOH Screenings   Depression (PHQ2-9): High Risk (03/21/2022)  Tobacco Use: High Risk (03/21/2022)   Additional Social History:                         Sleep: Fair  Appetite:  Fair  Current Medications:  Current Facility-Administered Medications  Medication Dose Route Frequency Provider Last Rate Last Admin   acetaminophen (TYLENOL) tablet 650 mg  650 mg Oral Q6H PRN Lenard Lance, FNP   650 mg at 03/22/22 0629   alum & mag hydroxide-simeth (MAALOX/MYLANTA) 200-200-20 MG/5ML suspension 30 mL  30 mL Oral Q4H PRN Lenard Lance, FNP       chlordiazePOXIDE (LIBRIUM) capsule 25 mg  25 mg Oral QID Carlyn Reichert, MD   25 mg at 03/22/22 1103   Followed by   Melene Muller ON 03/23/2022]  chlordiazePOXIDE (LIBRIUM) capsule 25 mg  25 mg Oral TID Carlyn Reichert, MD       Followed by   Melene Muller ON 03/24/2022] chlordiazePOXIDE (LIBRIUM) capsule 25 mg  25 mg Oral Charlestine Night, MD       Followed by   Melene Muller ON 03/25/2022] chlordiazePOXIDE (LIBRIUM) capsule 25 mg  25 mg Oral Daily Carlyn Reichert, MD       citalopram (CELEXA) tablet 20 mg  20 mg Oral Daily Lamar Sprinkles, MD   20 mg at 03/22/22 0913   gabapentin (  NEURONTIN) capsule 100 mg  100 mg Oral TID Lamar Sprinklesosby, Courtney, MD   100 mg at 03/22/22 0913   hydrocortisone (ANUSOL-HC) 2.5 % rectal cream   Rectal BID Onuoha, Chinwendu V, NP   Given at 03/22/22 0914   hydrOXYzine (ATARAX) tablet 25 mg  25 mg Oral TID PRN Lenard LanceAllen, Brenleigh L, FNP       loperamide (IMODIUM) capsule 2-4 mg  2-4 mg Oral PRN Lenard LanceAllen, Noga L, FNP       LORazepam (ATIVAN) tablet 1 mg  1 mg Oral Q6H PRN Lenard LanceAllen, Kyandra L, FNP       magnesium hydroxide (MILK OF MAGNESIA) suspension 30 mL  30 mL Oral Daily PRN Lenard LanceAllen, Dollie L, FNP       multivitamin with minerals tablet 1 tablet  1 tablet Oral Daily Lenard LanceAllen, Clyde L, FNP   1 tablet at 03/22/22 0913   nicotine (NICODERM CQ - dosed in mg/24 hours) patch 14 mg  14 mg Transdermal Daily Lenard LanceAllen, Alece L, FNP   14 mg at 03/22/22 0917   ondansetron (ZOFRAN-ODT) disintegrating tablet 4 mg  4 mg Oral Q6H PRN Lenard LanceAllen, Dorthey L, FNP       thiamine (VITAMIN B1) tablet 100 mg  100 mg Oral Daily Lenard LanceAllen, Kailee L, FNP   100 mg at 03/22/22 0913   traZODone (DESYREL) tablet 50 mg  50 mg Oral QHS PRN Lenard LanceAllen, Layanna L, FNP       Current Outpatient Medications  Medication Sig Dispense Refill   albuterol (VENTOLIN HFA) 108 (90 Base) MCG/ACT inhaler Inhale 1-2 puffs into the lungs every 6 (six) hours as needed for wheezing or shortness of breath. 6.7 g 0   hydrOXYzine (ATARAX) 25 MG tablet Take by mouth.     Melatonin 10 MG CAPS Take 20 tablets by mouth at bedtime as needed (sleep).      Labs  Lab Results:  Admission on 03/21/2022  Component Date Value Ref  Range Status   Sodium 03/21/2022 131 (L)  135 - 145 mmol/L Final   Potassium 03/21/2022 3.3 (L)  3.5 - 5.1 mmol/L Final   Chloride 03/21/2022 98  98 - 111 mmol/L Final   CO2 03/21/2022 21 (L)  22 - 32 mmol/L Final   Glucose, Bld 03/21/2022 160 (H)  70 - 99 mg/dL Final   Glucose reference range applies only to samples taken after fasting for at least 8 hours.   BUN 03/21/2022 5 (L)  6 - 20 mg/dL Final   Creatinine, Ser 03/21/2022 0.89  0.44 - 1.00 mg/dL Final   Calcium 82/95/621301/05/2022 8.7 (L)  8.9 - 10.3 mg/dL Final   GFR, Estimated 03/21/2022 >60  >60 mL/min Final   Comment: (NOTE) Calculated using the CKD-EPI Creatinine Equation (2021)    Anion gap 03/21/2022 12  5 - 15 Final   Performed at [facility], [address] [place], [place] [address]  Admission on 03/20/2022, Discharged on 03/21/2022  Component Date Value Ref Range Status   Sodium 03/20/2022 134 (L)  135 - 145 mmol/L Final   Potassium 03/20/2022 2.9 (L)  3.5 - 5.1 mmol/L Final   Chloride 03/20/2022 101  98 - 111 mmol/L Final   CO2 03/20/2022 22  22 - 32 mmol/L Final   Glucose, Bld 03/20/2022 126 (H)  70 - 99 mg/dL Final   Glucose reference range applies only to samples taken after fasting for at least 8 hours.   BUN 03/20/2022 10  6 - 20 mg/dL Final  Creatinine, Ser 03/20/2022 0.70  0.44 - 1.00 mg/dL Final   Calcium 29/51/8841 8.7 (L)  8.9 - 10.3 mg/dL Final   Total Protein 66/08/3014 7.6  6.5 - 8.1 g/dL Final   Albumin 03/27/3233 4.2  3.5 - 5.0 g/dL Final   AST 57/32/2025 65 (H)  15 - 41 U/L Final   ALT 03/20/2022 22  0 - 44 U/L Final   Alkaline Phosphatase 03/20/2022 100  38 - 126 U/L Final   Total Bilirubin 03/20/2022 0.4  0.3 - 1.2 mg/dL Final   GFR, Estimated 03/20/2022 >60  >60 mL/min Final   Comment: (NOTE) Calculated using the CKD-EPI Creatinine Equation (2021)    Anion gap 03/20/2022 11  5 - 15 Final   Performed at [facility], [address] [place], [place] [address]   Alcohol, Ethyl (B) 03/20/2022  142 (H)  <10 mg/dL Final   Comment: (NOTE) Lowest detectable limit for serum alcohol is 10 mg/dL.  For medical purposes only. Performed at [facility], [address], [place], [place] [address]    WBC 03/20/2022 5.5  4.0 - 10.5 K/uL Final   RBC 03/20/2022 4.69  3.87 - 5.11 MIL/uL Final   Hemoglobin 03/20/2022 14.5  12.0 - 15.0 g/dL Final   HCT 83/15/1761 41.7  36.0 - 46.0 % Final   MCV 03/20/2022 88.9  80.0 - 100.0 fL Final   MCH 03/20/2022 30.9  26.0 - 34.0 pg Final   MCHC 03/20/2022 34.8  30.0 - 36.0 g/dL Final   RDW 60/73/7106 15.6 (H)  11.5 - 15.5 % Final   Platelets 03/20/2022 193  150 - 400 K/uL Final   nRBC 03/20/2022 0.0  0.0 - 0.2 % Final   Performed at [facility], [address] [place], [place] [address]   Opiates 03/20/2022 NONE DETECTED  NONE DETECTED Final   Cocaine 03/20/2022 NONE DETECTED  NONE DETECTED Final   Benzodiazepines 03/20/2022 NONE DETECTED  NONE DETECTED Final   Amphetamines 03/20/2022 NONE DETECTED  NONE DETECTED Final   Tetrahydrocannabinol 03/20/2022 NONE DETECTED  NONE DETECTED Final   Barbiturates 03/20/2022 NONE DETECTED  NONE DETECTED Final   Comment: (NOTE) DRUG SCREEN FOR MEDICAL PURPOSES ONLY.  IF CONFIRMATION IS NEEDED FOR ANY PURPOSE, NOTIFY LAB WITHIN 5 DAYS.  LOWEST DETECTABLE LIMITS FOR URINE DRUG SCREEN Drug Class                     Cutoff (ng/mL) Amphetamine and metabolites    1000 Barbiturate and metabolites    200 Benzodiazepine                 200 Opiates and metabolites        300 Cocaine and metabolites        300 THC                            50 Performed at [facility], [address] [place], [place] [address]    Preg Test, Ur 03/20/2022 Negative  Negative Final   SARS Coronavirus 2 by RT PCR 03/21/2022 NEGATIVE  NEGATIVE Final   Comment: (NOTE) SARS-CoV-2 target nucleic acids are NOT DETECTED.  The SARS-CoV-2 RNA is generally detectable in upper respiratory specimens during the acute phase of infection. The  lowest concentration of SARS-CoV-2 viral copies this assay can detect is 138 copies/mL. A negative result does not preclude SARS-Cov-2 infection and should not be used as the sole basis for treatment  or other patient management decisions. A negative result may occur with  improper specimen collection/handling, submission of specimen other than nasopharyngeal swab, presence of viral mutation(s) within the areas targeted by this assay, and inadequate number of viral copies(<138 copies/mL). A negative result must be combined with clinical observations, patient history, and epidemiological information. The expected result is Negative.  Fact Sheet for Patients:  EntrepreneurPulse.com.au  Fact Sheet for Healthcare Providers:  IncredibleEmployment.be  This test is no                          t yet approved or cleared by the [place] FDA and  has been authorized for detection and/or diagnosis of SARS-CoV-2 by FDA under an Emergency Use Authorization (EUA). This EUA will remain  in effect (meaning this test can be used) for the duration of the COVID-19 declaration under Section 564(b)(1) of the Act, 21 U.S.C.section 360bbb-3(b)(1), unless the authorization is terminated  or revoked sooner.       Influenza A by PCR 03/21/2022 NEGATIVE  NEGATIVE Final   Influenza B by PCR 03/21/2022 NEGATIVE  NEGATIVE Final   Comment: (NOTE) The Xpert Xpress SARS-CoV-2/FLU/RSV plus assay is intended as an aid in the diagnosis of influenza from Nasopharyngeal swab specimens and should not be used as a sole basis for treatment. Nasal washings and aspirates are unacceptable for Xpert Xpress SARS-CoV-2/FLU/RSV testing.  Fact Sheet for Patients: EntrepreneurPulse.com.au  Fact Sheet for Healthcare Providers: IncredibleEmployment.be  This test is not yet approved or cleared by the [place] FDA and has been authorized for  detection and/or diagnosis of SARS-CoV-2 by FDA under an Emergency Use Authorization (EUA). This EUA will remain in effect (meaning this test can be used) for the duration of the COVID-19 declaration under Section 564(b)(1) of the Act, 21 U.S.C. section 360bbb-3(b)(1), unless the authorization is terminated or revoked.     Resp Syncytial Virus by PCR 03/21/2022 NEGATIVE  NEGATIVE Final   Comment: (NOTE) Fact Sheet for Patients: EntrepreneurPulse.com.au  Fact Sheet for Healthcare Providers: IncredibleEmployment.be  This test is not yet approved or cleared by the [place] FDA and has been authorized for detection and/or diagnosis of SARS-CoV-2 by FDA under an Emergency Use Authorization (EUA). This EUA will remain in effect (meaning this test can be used) for the duration of the COVID-19 declaration under Section 564(b)(1) of the Act, 21 U.S.C. section 360bbb-3(b)(1), unless the authorization is terminated or revoked.  Performed at [facility], [address] [place], [place] [address]   Admission on 01/18/2022, Discharged on 01/18/2022  Component Date Value Ref Range Status   WBC 01/18/2022 5.9  4.0 - 10.5 K/uL Final   RBC 01/18/2022 4.87  3.87 - 5.11 MIL/uL Final   Hemoglobin 01/18/2022 14.5  12.0 - 15.0 g/dL Final   HCT 01/18/2022 42.3  36.0 - 46.0 % Final   MCV 01/18/2022 86.9  80.0 - 100.0 fL Final   MCH 01/18/2022 29.8  26.0 - 34.0 pg Final   MCHC 01/18/2022 34.3  30.0 - 36.0 g/dL Final   RDW 01/18/2022 17.6 (H)  11.5 - 15.5 % Final   Platelets 01/18/2022 348  150 - 400 K/uL Final   nRBC 01/18/2022 0.0  0.0 - 0.2 % Final   Performed at [facility], [address], [place], [place] [address]   Troponin I (High Sensitivity) 01/18/2022 3  <18 ng/L Final   Comment: (NOTE) Elevated high sensitivity troponin I (hsTnI) values and significant  changes across serial measurements may suggest ACS but many other  chronic and acute conditions  are known to elevate hsTnI results.  Refer to the "Links" section for chest pain algorithms and additional  guidance. Performed at [facility], [address] [place], [place] [address]    Sodium 01/18/2022 139  135 - 145 mmol/L Final   Potassium 01/18/2022 3.6  3.5 - 5.1 mmol/L Final   Chloride 01/18/2022 104  98 - 111 mmol/L Final   CO2 01/18/2022 17 (L)  22 - 32 mmol/L Final   Glucose, Bld 01/18/2022 72  70 - 99 mg/dL Final   Glucose reference range applies only to samples taken after fasting for at least 8 hours.   BUN 01/18/2022 11  6 - 20 mg/dL Final   Creatinine, Ser 01/18/2022 0.90  0.44 - 1.00 mg/dL Final   Calcium 01/18/2022 8.8 (L)  8.9 - 10.3 mg/dL Final   Total Protein 01/18/2022 7.5  6.5 - 8.1 g/dL Final   Albumin 01/18/2022 3.9  3.5 - 5.0 g/dL Final   AST 01/18/2022 35  15 - 41 U/L Final   ALT 01/18/2022 19  0 - 44 U/L Final   Alkaline Phosphatase 01/18/2022 86  38 - 126 U/L Final   Total Bilirubin 01/18/2022 0.4  0.3 - 1.2 mg/dL Final   GFR, Estimated 01/18/2022 >60  >60 mL/min Final   Comment: (NOTE) Calculated using the CKD-EPI Creatinine Equation (2021)    Anion gap 01/18/2022 18 (H)  5 - 15 Final   Performed at [facility], [address], [place], [place] [address]   Troponin I (High Sensitivity) 01/18/2022 4  <18 ng/L Final   Comment: (NOTE) Elevated high sensitivity troponin I (hsTnI) values and significant  changes across serial measurements may suggest ACS but many other  chronic and acute conditions are known to elevate hsTnI results.  Refer to the "Links" section for chest pain algorithms and additional  guidance. Performed at [facility], [address] [place], [place] [address]     Blood Alcohol level:  Lab Results  Component Value Date   ETH 142 (H) 03/20/2022   ETH 133 (H) 02/54/2706    Metabolic Disorder Labs: No results found for: "HGBA1C", "MPG" No results found for: "PROLACTIN" No results found for: "CHOL", "TRIG", "HDL", "CHOLHDL",  "VLDL", "LDLCALC"  Therapeutic Lab Levels: No results found for: "LITHIUM" No results found for: "VALPROATE" No results found for: "CBMZ"  Physical Findings   Mini-Mental    [place] [facility] Visit from 08/11/2021 in [facility]  Total Score (max 30 points ) 26      PHQ2-9    [place] ED from 03/21/2022 in [facility] ED from 06/04/2021 in [place]  PHQ-2 Total Score 4 2  PHQ-9 Total Score 18 4      [place] [facility] from 03/21/2022 in [facility] ED from 03/20/2022 in [place] ED from 03/14/2022 in [place] CATEGORY High Risk High Risk Moderate Risk        Musculoskeletal  Strength & Muscle Tone: within normal limits Gait & Station: normal Patient leans: N/A  Psychiatric Specialty Exam  Presentation  General Appearance:  Casual  Eye Contact: Fair  Speech: Clear and Coherent  Speech Volume: normal  Mood and Affect  Mood: Euthymic  Affect: Appropriate; Congruent   Thought Process  Thought Processes: Coherent; Goal Directed  Descriptions of Associations:Intact  Orientation:Full (Time, Place and Person)  Thought Content:Logical; WDL  Diagnosis of Schizophrenia or Schizoaffective disorder in past: No    Hallucinations:none Ideas of Reference:None  Suicidal Thoughts: none Homicidal Thoughts: none  Sensorium  Memory: Immediate Fair; Recent Fair  Judgment: Fair  Insight: fair   Executive Functions  Concentration: Fair  Attention Span: Fair  Recall: FiservFair  Fund of Knowledge: Fair  Language: Fair   Psychomotor Activity  Psychomotor Activity: normal  Assets  Assets: Desire for Improvement; Resilience   Sleep  Sleep: fair  Physical Exam  Physical Exam Constitutional:      Appearance: the patient is not toxic-appearing.  Pulmonary:     Effort: Pulmonary  effort is normal.  Neurological:     General: No focal deficit present.     Mental Status: the patient is alert and oriented to person, place, and time.   Review of Systems  Respiratory:  Negative for shortness of breath.   Cardiovascular:  Negative for chest pain.  Gastrointestinal:  Negative for abdominal pain, constipation, diarrhea, nausea and vomiting.  Neurological:  Negative for headaches.   Blood pressure (!) 131/105, pulse (!) 101, temperature 97.8 F (36.6 C), temperature source Oral, resp. rate 20, last menstrual period 03/13/2022, SpO2 100 %. There is no height or weight on file to calculate BMI.  Treatment Plan Summary: Daily contact with patient to assess and evaluate symptoms and progress in treatment and Medication management send Status: Voluntary  Alcohol use disorder with depression - Continue Celexa for mood - Continue gabapentin and Librium taper for alcohol withdrawal  Medical: - Recheck potassium - Check hepatitis panel  Dispo: Follow-up with [facility], January 10th 9 AM  Carlyn ReichertNick Aubriella Perezgarcia, MD 03/22/2022 11:59 AM

## 2022-03-23 DIAGNOSIS — Z79899 Other long term (current) drug therapy: Secondary | ICD-10-CM | POA: Diagnosis not present

## 2022-03-23 DIAGNOSIS — Z9152 Personal history of nonsuicidal self-harm: Secondary | ICD-10-CM | POA: Diagnosis not present

## 2022-03-23 DIAGNOSIS — F1094 Alcohol use, unspecified with alcohol-induced mood disorder: Secondary | ICD-10-CM | POA: Diagnosis not present

## 2022-03-23 DIAGNOSIS — F17210 Nicotine dependence, cigarettes, uncomplicated: Secondary | ICD-10-CM | POA: Diagnosis not present

## 2022-03-23 NOTE — ED Notes (Signed)
Rn  called lab to see if the have enough blood to run the hcv rna quant ordered. Lab states that they will get back with me.

## 2022-03-23 NOTE — ED Provider Notes (Signed)
Behavioral Health Progress Note  Date and Time: 03/23/2022 10:26 AM Name: Hannah Rhodes MRN:  [id number]  Subjective:   Hannah Rhodes is a 42 year old female with a past psychiatric history significant for alcohol use disorder [facility] for complaint of worsening anxiety, depression, and alcohol use, and [facility] regarding her alcohol use disorder. She is scheduled for an appointment with [facility] in [place] for January 10, where she will have access to intensive outpatient resources following the appointment.   On assessment today, she reports her mood is stable, appears calms and euthymic on exam. Endorses adequate sleep and appetite. Denies suicidal ideation. She denies experiencing any anxiety, reports she is participating in unit milieu. Denies any withdrawals and cravings for alcohol today, is no longer experiencing migraines. Denies other any somatic symptoms. Denies any side effects to currently prescribed psychiatric medications. She will be discharged once her detox is complete.   Diagnosis:  Final diagnoses:  Alcohol-induced mood disorder (HCC)    Total Time spent with patient: 20 minutes  Past Psychiatric History: as above Past Medical History:  Past Medical History:  Diagnosis Date   Alcoholism (HCC)    Anxiety    Chronic hepatitis C (HCC)    Coma (HCC)    while pregnant   COPD (chronic obstructive pulmonary disease) (HCC)    no inaler   DDD (degenerative disc disease), lumbar    Fatty liver    Generalized headaches    GERD (gastroesophageal reflux disease)    diet controlled, no med   Hepatitis C    Heroin use    HPV (human papilloma virus) infection    Hypertension    Insomnia    Migraine    Panic attack    Pneumonia 03/2010   Smoker    Tachycardia     Past Surgical History:  Procedure Laterality Date   BIOPSY  09/28/2019   Procedure: BIOPSY;  Surgeon: Lemar LoftyMansouraty, Gabriel Jr., MD;  Location: [facility];  Service: Gastroenterology;;    CESAREAN SECTION     CESAREAN SECTION     ESOPHAGOGASTRODUODENOSCOPY (EGD) WITH PROPOFOL N/A 09/28/2019   Procedure: ESOPHAGOGASTRODUODENOSCOPY (EGD) WITH PROPOFOL;  Surgeon: Lemar LoftyMansouraty, Gabriel Jr., MD;  [facility];  Service: Gastroenterology;  Laterality: N/A;   EUS  09/28/2019   Procedure: UPPER ENDOSCOPIC ULTRASOUND (EUS) LINEAR;  Surgeon: Lemar LoftyMansouraty, Gabriel Jr., MD;  [facility];  Service: Gastroenterology;;   EUS  09/28/2019   Procedure: FULL UPPER ENDOSCOPIC ULTRASOUND (EUS) RADIAL;  Surgeon: Lemar LoftyMansouraty, Gabriel Jr., MD;  [facility];  Service: Gastroenterology;;   FINE NEEDLE ASPIRATION  09/28/2019   Procedure: FINE NEEDLE ASPIRATION (FNA) LINEAR;  Surgeon: Lemar LoftyMansouraty, Gabriel Jr., MD;  [facility];  Service: Gastroenterology;;   SMALL INTESTINE SURGERY     WISDOM TOOTH EXTRACTION     Family History:  Family History  Problem Relation Age of Onset   Heart failure Mother    CAD Mother    Heart attack Mother    Anxiety disorder Sister    Restless legs syndrome Sister    Dementia Maternal Grandmother    Dementia Maternal Grandfather    Colon cancer Neg Hx    Esophageal cancer Neg Hx    Inflammatory bowel disease Neg Hx    Liver disease Neg Hx    Pancreatic cancer Neg Hx    Stomach cancer Neg Hx    Rectal cancer Neg Hx    Family Psychiatric  History: none petinent Social History:  Social  History   Substance and Sexual Activity  Alcohol Use Yes   Comment: daily , beer, liquor and wine     Social History   Substance and Sexual Activity  Drug Use Not Currently   Types: Marijuana, Heroin, Oxycodone, Hydrocodone, Cocaine   Comment: nothing at this time (last month was last use 07/01/21)    Social History   Socioeconomic History   Marital status: Single    Spouse name: Not on file   Number of children: 7   Years of education: Not on file   Highest education level: Not on file  Occupational History   Occupation: Homemaker   Tobacco Use   Smoking status: Every Day    Packs/day: 0.50    Years: 24.00    Total pack years: 12.00    Types: Cigarettes   Smokeless tobacco: Never   Tobacco comments:    Cut back to 5 cig/day  Vaping Use   Vaping Use: Never used  Substance and Sexual Activity   Alcohol use: Yes    Comment: daily , beer, liquor and wine   Drug use: Not Currently    Types: Marijuana, Heroin, Oxycodone, Hydrocodone, Cocaine    Comment: nothing at this time (last month was last use 07/01/21)   Sexual activity: Not Currently  Other Topics Concern   Not on file  Social History Narrative   Not on file   Social Determinants of Health   Financial Resource Strain: Not on file  Food Insecurity: Not on file  Transportation Needs: Not on file  Physical Activity: Not on file  Stress: Not on file  Social Connections: Not on file   SDOH:  SDOH Screenings   Depression (PHQ2-9): High Risk (03/21/2022)  Tobacco Use: High Risk (03/21/2022)   Sleep: Fair  Appetite:  Fair  Current Medications:  Current Facility-Administered Medications  Medication Dose Route Frequency Provider Last Rate Last Admin   acetaminophen (TYLENOL) tablet 650 mg  650 mg Oral Q6H PRN Lenard LanceAllen, Jerine L, FNP   650 mg at 03/22/22 0629   alum & mag hydroxide-simeth (MAALOX/MYLANTA) 200-200-20 MG/5ML suspension 30 mL  30 mL Oral Q4H PRN Lenard LanceAllen, Annalysa L, FNP       chlordiazePOXIDE (LIBRIUM) capsule 25 mg  25 mg Oral TID Carlyn ReichertGabrielle, Nick, MD   25 mg at 03/23/22 [id number]   Followed by   Melene Muller[START ON 03/24/2022] chlordiazePOXIDE (LIBRIUM) capsule 25 mg  25 mg Oral Charlestine NightBH-qamhs Gabrielle, Nick, MD       Followed by   Melene Muller[START ON 03/25/2022] chlordiazePOXIDE (LIBRIUM) capsule 25 mg  25 mg Oral Daily Carlyn ReichertGabrielle, Nick, MD       citalopram (CELEXA) tablet 20 mg  20 mg Oral Daily Lamar Sprinklesosby, Courtney, MD   20 mg at 03/23/22 0959   gabapentin (NEURONTIN) capsule 100 mg  100 mg Oral TID Lamar Sprinklesosby, Courtney, MD   100 mg at 03/23/22 0959   hydrocortisone (ANUSOL-HC) 2.5 % rectal  cream   Rectal BID Onuoha, Chinwendu V, NP   Given at 03/22/22 2105   hydrOXYzine (ATARAX) tablet 25 mg  25 mg Oral TID PRN Lenard LanceAllen, Danai L, FNP       loperamide (IMODIUM) capsule 2-4 mg  2-4 mg Oral PRN Lenard LanceAllen, Synethia L, FNP       LORazepam (ATIVAN) tablet 1 mg  1 mg Oral Q6H PRN Lenard LanceAllen, Naviah L, FNP       magnesium hydroxide (MILK OF MAGNESIA) suspension 30 mL  30 mL Oral Daily PRN Lenard LanceAllen, Sherlie L, FNP  multivitamin with minerals tablet 1 tablet  1 tablet Oral Daily Lucky Rathke, FNP   1 tablet at 03/23/22 9767   nicotine (NICODERM CQ - dosed in mg/24 hours) patch 14 mg  14 mg Transdermal Daily Lucky Rathke, FNP   14 mg at 03/23/22 0959   ondansetron (ZOFRAN-ODT) disintegrating tablet 4 mg  4 mg Oral Q6H PRN Lucky Rathke, FNP       thiamine (VITAMIN B1) tablet 100 mg  100 mg Oral Daily Lucky Rathke, FNP   100 mg at 03/23/22 3419   traZODone (DESYREL) tablet 50 mg  50 mg Oral QHS PRN Lucky Rathke, FNP       Current Outpatient Medications  Medication Sig Dispense Refill   albuterol (VENTOLIN HFA) 108 (90 Base) MCG/ACT inhaler Inhale 1-2 puffs into the lungs every 6 (six) hours as needed for wheezing or shortness of breath. 6.7 g 0   hydrOXYzine (ATARAX) 25 MG tablet Take by mouth.     Melatonin 10 MG CAPS Take 20 tablets by mouth at bedtime as needed (sleep).      Labs  Lab Results:  Admission on 03/21/2022  Component Date Value Ref Range Status   Sodium 03/21/2022 131 (L)  135 - 145 mmol/L Final   Potassium 03/21/2022 3.3 (L)  3.5 - 5.1 mmol/L Final   Chloride 03/21/2022 98  98 - 111 mmol/L Final   CO2 03/21/2022 21 (L)  22 - 32 mmol/L Final   Glucose, Bld 03/21/2022 160 (H)  70 - 99 mg/dL Final   Glucose reference range applies only to samples taken after fasting for at least 8 hours.   BUN 03/21/2022 5 (L)  6 - 20 mg/dL Final   Creatinine, Ser 03/21/2022 0.89  0.44 - 1.00 mg/dL Final   Calcium 03/21/2022 8.7 (L)  8.9 - 10.3 mg/dL Final   GFR, Estimated 03/21/2022 >60  >60 mL/min  Final   Comment: (NOTE) Calculated using the CKD-EPI Creatinine Equation (2021)    Anion gap 03/21/2022 12  5 - 15 Final   Performed at [facility], [place] [address], [place], [place] [address]   Potassium 03/22/2022 4.0  3.5 - 5.1 mmol/L Final   Performed at [facility], [place] [address] [place], [place] [address]   Hepatitis B Surface Ag 03/22/2022 NON REACTIVE  NON REACTIVE Final   HCV Ab 03/22/2022 Reactive (A)  NON REACTIVE Final   Comment: (NOTE) The CDC recommends that a Reactive HCV antibody result be followed up  with a HCV Nucleic Acid Amplification test.     Hep A IgM 03/22/2022 NON REACTIVE  NON REACTIVE Final   Hep B C IgM 03/22/2022 NON REACTIVE  NON REACTIVE Final   Performed at [facility], [place] [address] [place], [place] [address]  Admission on 03/20/2022, Discharged on 03/21/2022  Component Date Value Ref Range Status   Sodium 03/20/2022 134 (L)  135 - 145 mmol/L Final   Potassium 03/20/2022 2.9 (L)  3.5 - 5.1 mmol/L Final   Chloride 03/20/2022 101  98 - 111 mmol/L Final   CO2 03/20/2022 22  22 - 32 mmol/L Final   Glucose, Bld 03/20/2022 126 (H)  70 - 99 mg/dL Final   Glucose reference range applies only to samples taken after fasting for at least 8 hours.   BUN 03/20/2022 10  6 - 20 mg/dL Final   Creatinine, Ser 03/20/2022 0.70  0.44 - 1.00 mg/dL Final  Calcium 03/20/2022 8.7 (L)  8.9 - 10.3 mg/dL Final   Total Protein 84/66/5993 7.6  6.5 - 8.1 g/dL Final   Albumin 57/03/7791 4.2  3.5 - 5.0 g/dL Final   AST 90/30/0923 65 (H)  15 - 41 U/L Final   ALT 03/20/2022 22  0 - 44 U/L Final   Alkaline Phosphatase 03/20/2022 100  38 - 126 U/L Final   Total Bilirubin 03/20/2022 0.4  0.3 - 1.2 mg/dL Final   GFR, Estimated 03/20/2022 >60  >60 mL/min Final   Comment: (NOTE) Calculated using the CKD-EPI Creatinine Equation (2021)    Anion gap 03/20/2022 11  5 - 15 Final   Performed at [facility], [address], [place], [place] [address]   Alcohol,  Ethyl (B) 03/20/2022 142 (H)  <10 mg/dL Final   Comment: (NOTE) Lowest detectable limit for serum alcohol is 10 mg/dL.  For medical purposes only. Performed at [facility], [address] [place], [place] [address]    WBC 03/20/2022 5.5  4.0 - 10.5 K/uL Final   RBC 03/20/2022 4.69  3.87 - 5.11 MIL/uL Final   Hemoglobin 03/20/2022 14.5  12.0 - 15.0 g/dL Final   HCT 35/45/6256 41.7  36.0 - 46.0 % Final   MCV 03/20/2022 88.9  80.0 - 100.0 fL Final   MCH 03/20/2022 30.9  26.0 - 34.0 pg Final   MCHC 03/20/2022 34.8  30.0 - 36.0 g/dL Final   RDW 38/93/7342 15.6 (H)  11.5 - 15.5 % Final   Platelets 03/20/2022 193  150 - 400 K/uL Final   nRBC 03/20/2022 0.0  0.0 - 0.2 % Final   Performed at [facility], [address], [place], [place] [address]   Opiates 03/20/2022 NONE DETECTED  NONE DETECTED Final   Cocaine 03/20/2022 NONE DETECTED  NONE DETECTED Final   Benzodiazepines 03/20/2022 NONE DETECTED  NONE DETECTED Final   Amphetamines 03/20/2022 NONE DETECTED  NONE DETECTED Final   Tetrahydrocannabinol 03/20/2022 NONE DETECTED  NONE DETECTED Final   Barbiturates 03/20/2022 NONE DETECTED  NONE DETECTED Final   Comment: (NOTE) DRUG SCREEN FOR MEDICAL PURPOSES ONLY.  IF CONFIRMATION IS NEEDED FOR ANY PURPOSE, NOTIFY LAB WITHIN 5 DAYS.  LOWEST DETECTABLE LIMITS FOR URINE DRUG SCREEN Drug Class                     Cutoff (ng/mL) Amphetamine and metabolites    1000 Barbiturate and metabolites    200 Benzodiazepine                 200 Opiates and metabolites        300 Cocaine and metabolites        300 THC                            50 Performed at [facility], [address], [place], [place] [address]    Preg Test, Ur 03/20/2022 Negative  Negative Final   SARS Coronavirus 2 by RT PCR 03/21/2022 NEGATIVE  NEGATIVE Final   Comment: (NOTE) SARS-CoV-2 target nucleic acids are NOT DETECTED.  The SARS-CoV-2 RNA is generally detectable in upper respiratory specimens during the acute phase  of infection. The lowest concentration of SARS-CoV-2 viral copies this assay can detect is 138 copies/mL. A negative result does not preclude SARS-Cov-2 infection and should not be used as the sole basis for treatment or other patient management decisions. A negative result may occur with  improper specimen collection/handling, submission of specimen other than nasopharyngeal swab, presence of viral mutation(s) within the areas targeted by this assay, and inadequate number of viral copies(<138 copies/mL). A negative result must be combined with clinical observations, patient history, and epidemiological information. The expected result is Negative.  Fact Sheet for Patients:  BloggerCourse.com  Fact Sheet for Healthcare Providers:  SeriousBroker.it  This test is no                          t yet approved or cleared by the [place] FDA and  has been authorized for detection and/or diagnosis of SARS-CoV-2 by FDA under an Emergency Use Authorization (EUA). This EUA will remain  in effect (meaning this test can be used) for the duration of the COVID-19 declaration under Section 564(b)(1) of the Act, 21 U.S.C.section 360bbb-3(b)(1), unless the authorization is terminated  or revoked sooner.       Influenza A by PCR 03/21/2022 NEGATIVE  NEGATIVE Final   Influenza B by PCR 03/21/2022 NEGATIVE  NEGATIVE Final   Comment: (NOTE) The Xpert Xpress SARS-CoV-2/FLU/RSV plus assay is intended as an aid in the diagnosis of influenza from Nasopharyngeal swab specimens and should not be used as a sole basis for treatment. Nasal washings and aspirates are unacceptable for Xpert Xpress SARS-CoV-2/FLU/RSV testing.  Fact Sheet for Patients: BloggerCourse.com  Fact Sheet for Healthcare Providers: SeriousBroker.it  This test is not yet approved or cleared by the [place] FDA and has been  authorized for detection and/or diagnosis of SARS-CoV-2 by FDA under an Emergency Use Authorization (EUA). This EUA will remain in effect (meaning this test can be used) for the duration of the COVID-19 declaration under Section 564(b)(1) of the Act, 21 U.S.C. section 360bbb-3(b)(1), unless the authorization is terminated or revoked.     Resp Syncytial Virus by PCR 03/21/2022 NEGATIVE  NEGATIVE Final   Comment: (NOTE) Fact Sheet for Patients: BloggerCourse.com  Fact Sheet for Healthcare Providers: SeriousBroker.it  This test is not yet approved or cleared by the [place] FDA and has been authorized for detection and/or diagnosis of SARS-CoV-2 by FDA under an Emergency Use Authorization (EUA). This EUA will remain in effect (meaning this test can be used) for the duration of the COVID-19 declaration under Section 564(b)(1) of the Act, 21 U.S.C. section 360bbb-3(b)(1), unless the authorization is terminated or revoked.  Performed at [facility], [address], [place], [place] [address]   Admission on 01/18/2022, Discharged on 01/18/2022  Component Date Value Ref Range Status   WBC 01/18/2022 5.9  4.0 - 10.5 K/uL Final   RBC 01/18/2022 4.87  3.87 - 5.11 MIL/uL Final   Hemoglobin 01/18/2022 14.5  12.0 - 15.0 g/dL Final   HCT 54/00/8676 42.3  36.0 - 46.0 % Final   MCV 01/18/2022 86.9  80.0 - 100.0 fL Final   MCH 01/18/2022 29.8  26.0 - 34.0 pg Final   MCHC 01/18/2022 34.3  30.0 - 36.0 g/dL Final   RDW 19/50/9326 17.6 (H)  11.5 - 15.5 % Final   Platelets 01/18/2022 348  150 - 400 K/uL Final   nRBC 01/18/2022 0.0  0.0 - 0.2 % Final   Performed at [facility], [address], [place], [place] [address]   Troponin I (High Sensitivity) 01/18/2022 3  <18 ng/L Final   Comment: (NOTE) Elevated high sensitivity troponin I (hsTnI) values and significant  changes across serial measurements may suggest ACS but many other  chronic  and  acute conditions are known to elevate hsTnI results.  Refer to the "Links" section for chest pain algorithms and additional  guidance. Performed at [facility], [address], [place], [place] [address]    Sodium 01/18/2022 139  135 - 145 mmol/L Final   Potassium 01/18/2022 3.6  3.5 - 5.1 mmol/L Final   Chloride 01/18/2022 104  98 - 111 mmol/L Final   CO2 01/18/2022 17 (L)  22 - 32 mmol/L Final   Glucose, Bld 01/18/2022 72  70 - 99 mg/dL Final   Glucose reference range applies only to samples taken after fasting for at least 8 hours.   BUN 01/18/2022 11  6 - 20 mg/dL Final   Creatinine, Ser 01/18/2022 0.90  0.44 - 1.00 mg/dL Final   Calcium 02/58/5277 8.8 (L)  8.9 - 10.3 mg/dL Final   Total Protein 82/42/3536 7.5  6.5 - 8.1 g/dL Final   Albumin 14/43/1540 3.9  3.5 - 5.0 g/dL Final   AST 08/67/6195 35  15 - 41 U/L Final   ALT 01/18/2022 19  0 - 44 U/L Final   Alkaline Phosphatase 01/18/2022 86  38 - 126 U/L Final   Total Bilirubin 01/18/2022 0.4  0.3 - 1.2 mg/dL Final   GFR, Estimated 01/18/2022 >60  >60 mL/min Final   Comment: (NOTE) Calculated using the CKD-EPI Creatinine Equation (2021)    Anion gap 01/18/2022 18 (H)  5 - 15 Final   Performed at [facility], [address] [place], [place] [address]   Troponin I (High Sensitivity) 01/18/2022 4  <18 ng/L Final   Comment: (NOTE) Elevated high sensitivity troponin I (hsTnI) values and significant  changes across serial measurements may suggest ACS but many other  chronic and acute conditions are known to elevate hsTnI results.  Refer to the "Links" section for chest pain algorithms and additional  guidance. Performed at [facility], [address] [place], [place] [address]     Blood Alcohol level:  Lab Results  Component Value Date   ETH 142 (H) 03/20/2022   ETH 133 (H) 07/01/2021    Metabolic Disorder Labs: No results found for: "HGBA1C", "MPG" No results found for: "PROLACTIN" No results found for: "CHOL", "TRIG",  "HDL", "CHOLHDL", "VLDL", "LDLCALC"  Therapeutic Lab Levels: No results found for: "LITHIUM" No results found for: "VALPROATE" No results found for: "CBMZ"  Physical Findings   Mini-Mental    Flowsheet Row Office Visit from 08/11/2021 in [facility]  Total Score (max 30 points ) 26      PHQ2-9    Flowsheet Row ED from 03/21/2022 in [facility] ED from 06/04/2021 in [place] EMERGENCY DEPARTMENT  PHQ-2 Total Score 4 2  PHQ-9 Total Score 18 4      Flowsheet Row ED from 03/21/2022 in [facility] ED from 03/20/2022 in [place] EMERGENCY DEPARTMENT ED from 03/14/2022 in [facility]GENCY [facility]  C-SSRS RISK CATEGORY High Risk High Risk Moderate Risk        Musculoskeletal  Strength & Muscle Tone: within normal limits Gait & Station: normal Patient leans: N/A  Psychiatric Specialty Exam  Presentation  General Appearance:  Casual  Eye Contact: Fair  Speech: Clear and Coherent  Speech Volume: normal  Mood and Affect  Mood: "Im good"  Affect: Appropriate; Congruent   Thought Process  Thought Processes: Coherent; Goal Directed  Descriptions of Associations:Intact  Orientation:Full (Time, Place and Person)  Thought Content:Logical; WDL  Diagnosis  of Schizophrenia or Schizoaffective disorder in past: No    Hallucinations:none Ideas of Reference:None  Suicidal Thoughts: none Homicidal Thoughts: none  Sensorium  Memory: Immediate Fair; Recent Fair  Judgment: Fair  Insight: fair   Executive Functions  Concentration: Fair  Attention Span: Fair  Recall: [employer] of Knowledge: Fair  Language: Fair   Psychomotor Activity  Psychomotor Activity: normal  Assets  Assets: Desire for Improvement; Resilience   Sleep  Sleep: fair  Physical Exam  Physical Exam Constitutional:      Appearance: the patient is not toxic-appearing.  Pulmonary:      Effort: Pulmonary effort is normal.  Neurological:     General: No focal deficit present.     Mental Status: the patient is alert and oriented to person, place, and time.   Review of Systems  Respiratory:  Negative for shortness of breath.   Cardiovascular:  Negative for chest pain.  Gastrointestinal:  Negative for abdominal pain, constipation, diarrhea, nausea and vomiting.  Neurological:  Negative for headaches.   Blood pressure 125/86, pulse 92, temperature 98.3 F (36.8 C), temperature source Tympanic, resp. rate 18, last menstrual period 03/13/2022, SpO2 98 %. There is no height or weight on file to calculate BMI.  Treatment Plan Summary: Daily contact with patient to assess and evaluate symptoms and progress in treatment and Medication management send Status: Voluntary  Alcohol use disorder with depression - Continue Celexa for mood - Continue gabapentin and Librium taper for alcohol withdrawal  Medical: - Recheck potassium 4.0 mmol/L - Hepatitis Panel Reports a previous history Hepatitis B Reactive to HCV Ab  Dispo: Follow-up with [facility], January 10th 9 AM  Christene Slates, MD 03/23/2022 10:26 AM

## 2022-03-23 NOTE — ED Notes (Signed)
PT. In dayroom with peers. Asking to speak wit this nurse alone. Pt is tearful and slightly anxious. She states her 85yr old daughter who has MS is being transported to [facility] and she has RSV.Pt. is not requesting to leave she just needed to talk. Educated pt of med process and we can check if she has anything for anxiety. She requests to wait until closer to bedtime so that she can sleep and rest her mind. She denies SI/HI/AVH, denies any s/s of withdrawal. She CFS and will alert staff if having negative thoughts. Resp even and unlabored. Will continue to monitor for safety

## 2022-03-23 NOTE — ED Notes (Signed)
Patient is watching tv in the dayroom. Alert and oriented. Will continue to monitor for safety. 

## 2022-03-23 NOTE — ED Notes (Signed)
Pt is in the bed sleeping. Respirations are even and unlabored. No acute distress noted. Will continue to monitor for safety. 

## 2022-03-23 NOTE — ED Notes (Signed)
Patient  sleeping in no acute stress. RR even and unlabored .Environment secured .Will continue to monitor for safely. 

## 2022-03-23 NOTE — ED Notes (Signed)
Patient alert and oriented x 3. Denies SI/HI/AVH. Denies intent or plan to harm self or others. Routine conducted according to faculty protocol. Encourage patient to notify staff with any needs or concerns. Patient verbalized agreement and understanding. Will continue to monitor for safety. 

## 2022-03-23 NOTE — ED Notes (Signed)
Patient worked on suicide safety plan or group. 

## 2022-03-23 NOTE — ED Notes (Signed)
Lab states that they have enough blood for the patients blood test

## 2022-03-23 NOTE — Group Note (Signed)
Group Topic: Relaxation  Group Date: 03/23/2022 Start Time: 0800 End Time: 0830 Facilitators: Mervyn Skeeters D, NT  Department: [facility]  Number of Participants: 7  Group Focus: relaxation Treatment Modality:  Psychoeducation Interventions utilized were leisure development Purpose: increase insight  Name: Hannah Rhodes Date of Birth: March 25, 1980  MR: [id number]    Level of Participation: moderate Quality of Participation: attentive Interactions with others: gave feedback Mood/Affect: appropriate Triggers (if applicable): n/a Cognition: coherent/clear Progress: Moderate Response: n/a Plan: follow-up needed  Patients Problems:  Patient Active Problem List   Diagnosis Date Noted   Alcohol abuse with alcohol-induced mood disorder ([place]) 03/21/2022   Portal hypertension ([place]) 06/05/2021   Hypophosphatemia 01/28/2020   Hypocalcemia 01/28/2020   COVID    Lactic acidosis 01/27/2020   COPD (chronic obstructive pulmonary disease) ([place])    Hypertension    Pancreatic cyst 07/22/2019   Chronic abdominal pain 07/22/2019   Abnormal LFTs 07/22/2019   Abnormal MRI of abdomen 07/22/2019   Bile duct stricture 07/22/2019   Common bile duct dilation 07/22/2019   Hepatitis C, chronic ([place]) 05/06/2019   Abnormal ultrasound of abdomen 05/06/2019   Hepatitis C antibody positive in blood 03/10/2019   Alcoholism ([place]) 03/10/2019   Loss of weight 03/10/2019   SOB (shortness of breath) 10/24/2009   ANXIETY STATE, UNSPECIFIED 10/21/2009   NONDEPENDENT TOBACCO USE DISORDER 10/21/2009   INSOMNIA UNSPECIFIED 10/21/2009   PALPITATIONS 10/21/2009

## 2022-03-24 DIAGNOSIS — F1094 Alcohol use, unspecified with alcohol-induced mood disorder: Secondary | ICD-10-CM | POA: Diagnosis not present

## 2022-03-24 DIAGNOSIS — F17210 Nicotine dependence, cigarettes, uncomplicated: Secondary | ICD-10-CM | POA: Diagnosis not present

## 2022-03-24 DIAGNOSIS — Z79899 Other long term (current) drug therapy: Secondary | ICD-10-CM | POA: Diagnosis not present

## 2022-03-24 DIAGNOSIS — Z9152 Personal history of nonsuicidal self-harm: Secondary | ICD-10-CM | POA: Diagnosis not present

## 2022-03-24 LAB — HCV RNA QUANT: HCV Quantitative: NOT DETECTED IU/mL (ref >50)

## 2022-03-24 NOTE — ED Notes (Signed)
Pt sleeping in room. Resp even and unlabored. Will continue to monitor for safety 

## 2022-03-24 NOTE — ED Notes (Signed)
Patient A&Ox4. Denies intent to harm self/others when asked. Denies A/VH. Patient denies any physical complaints when asked. No acute distress noted. Pt states, "I feel a lot better today than I have been". Routine safety checks conducted according to facility protocol. Encouraged patient to notify staff if thoughts of harm toward self or others arise. Patient verbalize understanding and agreement. Will continue to monitor for safety.

## 2022-03-24 NOTE — ED Notes (Signed)
Breakfast Served  

## 2022-03-24 NOTE — ED Provider Notes (Signed)
Behavioral Health Progress Note  Date and Time: 03/24/2022 2:27 PM Name: Annalysia Willenbring MRN:  [id number]  Subjective:   Leslye Puccini is a 42 year old female with a past psychiatric history significant for alcohol use disorder presenting to [facility] for complaint of worsening anxiety, depression, and alcohol use, and admitted to [facility] for help regarding her alcohol use disorder. She is scheduled for an appointment with [facility] in [place] for January 10, where she will have access to intensive outpatient resources following the appointment.   On assessment today, she reports her mood is stable, but she is little anxious and worried about her daughter.  Her daughter was admitted to [facility] last night due to respiratory problems and currently on CPAP.  Daughter has MS and history of brain injury.  She reports that she was not able to sleep well last night and was thinking about her.  Reports stable appetite. Denies suicidal ideation. Denies any withdrawals and cravings for alcohol today, is no longer experiencing migraines. Denies other any somatic symptoms.  She is aware of HCV diagnosis and will follow up with PCP.  She received medication in the past for HCV.  Denies any side effects to currently prescribed psychiatric medications. She will be discharged to home after her detox is finished and will follow-up with [facility] in [place] for intensive outpatient resources.   Diagnosis:  Final diagnoses:  Alcohol-induced mood disorder (HCC)    Total Time spent with patient: 20 minutes  Past Psychiatric History: as above Past Medical History:  Past Medical History:  Diagnosis Date   Alcoholism (HCC)    Anxiety    Chronic hepatitis C (HCC)    Coma (HCC)    while pregnant   COPD (chronic obstructive pulmonary disease) (HCC)    no inaler   DDD (degenerative disc disease), lumbar    Fatty liver    Generalized headaches    GERD (gastroesophageal reflux disease)    diet controlled, no med   Hepatitis C     Heroin use    HPV (human papilloma virus) infection    Hypertension    Insomnia    Migraine    Panic attack    Pneumonia 03/2010   Smoker    Tachycardia     Past Surgical History:  Procedure Laterality Date   BIOPSY  09/28/2019   Procedure: BIOPSY;  Surgeon: Lemar Lofty., MD;  Location: [facility] ENDOSCOPY;  Service: Gastroenterology;;   CESAREAN SECTION     CESAREAN SECTION     ESOPHAGOGASTRODUODENOSCOPY (EGD) WITH PROPOFOL N/A 09/28/2019   Procedure: ESOPHAGOGASTRODUODENOSCOPY (EGD) WITH PROPOFOL;  Surgeon: Lemar Lofty., MD;  Location: [facility] ENDOSCOPY;  Service: Gastroenterology;  Laterality: N/A;   EUS  09/28/2019   Procedure: UPPER ENDOSCOPIC ULTRASOUND (EUS) LINEAR;  Surgeon: Lemar Lofty., MD;  Location: [facility] ENDOSCOPY;  Service: Gastroenterology;;   EUS  09/28/2019   Procedure: FULL UPPER ENDOSCOPIC ULTRASOUND (EUS) RADIAL;  Surgeon: Lemar Lofty., MD;  Location: [facility] ENDOSCOPY;  Service: Gastroenterology;;   FINE NEEDLE ASPIRATION  09/28/2019   Procedure: FINE NEEDLE ASPIRATION (FNA) LINEAR;  Surgeon: Lemar Lofty., MD;  Location: [facility] ENDOSCOPY;  Service: Gastroenterology;;   SMALL INTESTINE SURGERY     WISDOM TOOTH EXTRACTION     Family History:  Family History  Problem Relation Age of Onset   Heart failure Mother    CAD Mother    Heart attack Mother    Anxiety disorder Sister    Restless legs syndrome Sister    Dementia  Maternal Grandmother    Dementia Maternal Grandfather    Colon cancer Neg Hx    Esophageal cancer Neg Hx    Inflammatory bowel disease Neg Hx    Liver disease Neg Hx    Pancreatic cancer Neg Hx    Stomach cancer Neg Hx    Rectal cancer Neg Hx    Family Psychiatric  History: none petinent Social History:  Social History   Substance and Sexual Activity  Alcohol Use Yes   Comment: daily , beer, liquor and wine     Social History   Substance and Sexual Activity  Drug Use Not Currently   Types: Marijuana,  Heroin, Oxycodone, Hydrocodone, Cocaine   Comment: nothing at this time (last month was last use 07/01/21)    Social History   Socioeconomic History   Marital status: Single    Spouse name: Not on file   Number of children: 7   Years of education: Not on file   Highest education level: Not on file  Occupational History   Occupation: Homemaker  Tobacco Use   Smoking status: Every Day    Packs/day: 0.50    Years: 24.00    Total pack years: 12.00    Types: Cigarettes   Smokeless tobacco: Never   Tobacco comments:    Cut back to 5 cig/day  Vaping Use   Vaping Use: Never used  Substance and Sexual Activity   Alcohol use: Yes    Comment: daily , beer, liquor and wine   Drug use: Not Currently    Types: Marijuana, Heroin, Oxycodone, Hydrocodone, Cocaine    Comment: nothing at this time (last month was last use 07/01/21)   Sexual activity: Not Currently  Other Topics Concern   Not on file  Social History Narrative   Not on file   Social Determinants of Health   Financial Resource Strain: Not on file  Food Insecurity: Not on file  Transportation Needs: Not on file  Physical Activity: Not on file  Stress: Not on file  Social Connections: Not on file   SDOH:  SDOH Screenings   Depression (PHQ2-9): Low Risk  (03/23/2022)  Recent Concern: Depression (PHQ2-9) - High Risk (03/21/2022)  Tobacco Use: High Risk (03/21/2022)   Sleep: Fair  Appetite:  Fair  Current Medications:  Current Facility-Administered Medications  Medication Dose Route Frequency Provider Last Rate Last Admin   acetaminophen (TYLENOL) tablet 650 mg  650 mg Oral Q6H PRN Lenard Lance, FNP   650 mg at 03/22/22 0629   alum & mag hydroxide-simeth (MAALOX/MYLANTA) 200-200-20 MG/5ML suspension 30 mL  30 mL Oral Q4H PRN Lenard Lance, FNP       chlordiazePOXIDE (LIBRIUM) capsule 25 mg  25 mg Oral Charlestine Night, MD   25 mg at 03/24/22 0908   Followed by   Melene Muller ON 03/25/2022] chlordiazePOXIDE (LIBRIUM)  capsule 25 mg  25 mg Oral Daily Carlyn Reichert, MD       citalopram (CELEXA) tablet 20 mg  20 mg Oral Daily Lamar Sprinkles, MD   20 mg at 03/24/22 0908   gabapentin (NEURONTIN) capsule 100 mg  100 mg Oral TID Lamar Sprinkles, MD   100 mg at 03/24/22 0908   hydrocortisone (ANUSOL-HC) 2.5 % rectal cream   Rectal BID Onuoha, Chinwendu V, NP   Given at 03/22/22 2105   hydrOXYzine (ATARAX) tablet 25 mg  25 mg Oral TID PRN Lenard Lance, FNP   25 mg at 03/23/22 2114   magnesium  hydroxide (MILK OF MAGNESIA) suspension 30 mL  30 mL Oral Daily PRN Lenard Lance, FNP       multivitamin with minerals tablet 1 tablet  1 tablet Oral Daily Lenard Lance, FNP   1 tablet at 03/24/22 0908   nicotine (NICODERM CQ - dosed in mg/24 hours) patch 14 mg  14 mg Transdermal Daily Lenard Lance, FNP   14 mg at 03/24/22 6568   thiamine (VITAMIN B1) tablet 100 mg  100 mg Oral Daily Lenard Lance, FNP   100 mg at 03/24/22 1275   traZODone (DESYREL) tablet 50 mg  50 mg Oral QHS PRN Lenard Lance, FNP       Current Outpatient Medications  Medication Sig Dispense Refill   albuterol (VENTOLIN HFA) 108 (90 Base) MCG/ACT inhaler Inhale 1-2 puffs into the lungs every 6 (six) hours as needed for wheezing or shortness of breath. 6.7 g 0   hydrOXYzine (ATARAX) 25 MG tablet Take by mouth.     Melatonin 10 MG CAPS Take 20 tablets by mouth at bedtime as needed (sleep).      Labs  Lab Results:  Admission on 03/21/2022  Component Date Value Ref Range Status   Sodium 03/21/2022 131 (L)  135 - 145 mmol/L Final   Potassium 03/21/2022 3.3 (L)  3.5 - 5.1 mmol/L Final   Chloride 03/21/2022 98  98 - 111 mmol/L Final   CO2 03/21/2022 21 (L)  22 - 32 mmol/L Final   Glucose, Bld 03/21/2022 160 (H)  70 - 99 mg/dL Final   Glucose reference range applies only to samples taken after fasting for at least 8 hours.   BUN 03/21/2022 5 (L)  6 - 20 mg/dL Final   Creatinine, Ser 03/21/2022 0.89  0.44 - 1.00 mg/dL Final   Calcium 17/00/1749 8.7 (L)   8.9 - 10.3 mg/dL Final   GFR, Estimated 03/21/2022 >60  >60 mL/min Final   Comment: (NOTE) Calculated using the CKD-EPI Creatinine Equation (2021)    Anion gap 03/21/2022 12  5 - 15 Final   Performed at [facility], [address] [place], [place] [address]   Potassium 03/22/2022 4.0  3.5 - 5.1 mmol/L Final   Performed at [facility], [address], [place], [place] [address]   Hepatitis B Surface Ag 03/22/2022 NON REACTIVE  NON REACTIVE Final   HCV Ab 03/22/2022 Reactive (A)  NON REACTIVE Final   Comment: (NOTE) The CDC recommends that a Reactive HCV antibody result be followed up  with a HCV Nucleic Acid Amplification test.     Hep A IgM 03/22/2022 NON REACTIVE  NON REACTIVE Final   Hep B C IgM 03/22/2022 NON REACTIVE  NON REACTIVE Final   Performed at [facility], [address] [place], [place] [address]  Admission on 03/20/2022, Discharged on 03/21/2022  Component Date Value Ref Range Status   Sodium 03/20/2022 134 (L)  135 - 145 mmol/L Final   Potassium 03/20/2022 2.9 (L)  3.5 - 5.1 mmol/L Final   Chloride 03/20/2022 101  98 - 111 mmol/L Final   CO2 03/20/2022 22  22 - 32 mmol/L Final   Glucose, Bld 03/20/2022 126 (H)  70 - 99 mg/dL Final   Glucose reference range applies only to samples taken after fasting for at least 8 hours.   BUN 03/20/2022 10  6 - 20 mg/dL Final   Creatinine, Ser 03/20/2022 0.70  0.44 - 1.00 mg/dL Final  Calcium 03/20/2022 8.7 (L)  8.9 - 10.3 mg/dL Final   Total Protein 81/19/147801/04/2022 7.6  6.5 - 8.1 g/dL Final   Albumin 29/56/213001/04/2022 4.2  3.5 - 5.0 g/dL Final   AST 86/57/846901/04/2022 65 (H)  15 - 41 U/L Final   ALT 03/20/2022 22  0 - 44 U/L Final   Alkaline Phosphatase 03/20/2022 100  38 - 126 U/L Final   Total Bilirubin 03/20/2022 0.4  0.3 - 1.2 mg/dL Final   GFR, Estimated 03/20/2022 >60  >60 mL/min Final   Comment: (NOTE) Calculated using the CKD-EPI Creatinine Equation (2021)    Anion gap 03/20/2022 11  5 - 15 Final   Performed at  [facility], [address] [place], [place] [address]   Alcohol, Ethyl (B) 03/20/2022 142 (H)  <10 mg/dL Final   Comment: (NOTE) Lowest detectable limit for serum alcohol is 10 mg/dL.  For medical purposes only. Performed at [facility], [address] [place], [place] [address]    WBC 03/20/2022 5.5  4.0 - 10.5 K/uL Final   RBC 03/20/2022 4.69  3.87 - 5.11 MIL/uL Final   Hemoglobin 03/20/2022 14.5  12.0 - 15.0 g/dL Final   HCT 44/01/027201/04/2022 41.7  36.0 - 46.0 % Final   MCV 03/20/2022 88.9  80.0 - 100.0 fL Final   MCH 03/20/2022 30.9  26.0 - 34.0 pg Final   MCHC 03/20/2022 34.8  30.0 - 36.0 g/dL Final   RDW 53/66/440301/04/2022 15.6 (H)  11.5 - 15.5 % Final   Platelets 03/20/2022 193  150 - 400 K/uL Final   nRBC 03/20/2022 0.0  0.0 - 0.2 % Final   Performed at [facility], [address] [place], [place] [address]   Opiates 03/20/2022 NONE DETECTED  NONE DETECTED Final   Cocaine 03/20/2022 NONE DETECTED  NONE DETECTED Final   Benzodiazepines 03/20/2022 NONE DETECTED  NONE DETECTED Final   Amphetamines 03/20/2022 NONE DETECTED  NONE DETECTED Final   Tetrahydrocannabinol 03/20/2022 NONE DETECTED  NONE DETECTED Final   Barbiturates 03/20/2022 NONE DETECTED  NONE DETECTED Final   Comment: (NOTE) DRUG SCREEN FOR MEDICAL PURPOSES ONLY.  IF CONFIRMATION IS NEEDED FOR ANY PURPOSE, NOTIFY LAB WITHIN 5 DAYS.  LOWEST DETECTABLE LIMITS FOR URINE DRUG SCREEN Drug Class                     Cutoff (ng/mL) Amphetamine and metabolites    1000 Barbiturate and metabolites    200 Benzodiazepine                 200 Opiates and metabolites        300 Cocaine and metabolites        300 THC                            50 Performed at [facility], [address] [place], [place] [address]    Preg Test, Ur 03/20/2022 Negative  Negative Final   SARS Coronavirus 2 by RT PCR 03/21/2022 NEGATIVE  NEGATIVE Final   Comment: (NOTE) SARS-CoV-2 target nucleic acids are NOT DETECTED.  The SARS-CoV-2 RNA is  generally detectable in upper respiratory specimens during the acute phase of infection. The lowest concentration of SARS-CoV-2 viral copies this assay can detect is 138 copies/mL. A negative result does not preclude SARS-Cov-2 infection and should not be used as the sole basis for treatment or other patient management decisions. A negative result may occur with  improper specimen collection/handling, submission of specimen other than nasopharyngeal swab, presence of viral mutation(s) within the areas targeted by this assay, and inadequate number of viral copies(<138 copies/mL). A negative result must be combined with clinical observations, patient history, and epidemiological information. The expected result is Negative.  Fact Sheet for Patients:  BloggerCourse.com  Fact Sheet for Healthcare Providers:  SeriousBroker.it  This test is no                          t yet approved or cleared by the [place] FDA and  has been authorized for detection and/or diagnosis of SARS-CoV-2 by FDA under an Emergency Use Authorization (EUA). This EUA will remain  in effect (meaning this test can be used) for the duration of the COVID-19 declaration under Section 564(b)(1) of the Act, 21 U.S.C.section 360bbb-3(b)(1), unless the authorization is terminated  or revoked sooner.       Influenza A by PCR 03/21/2022 NEGATIVE  NEGATIVE Final   Influenza B by PCR 03/21/2022 NEGATIVE  NEGATIVE Final   Comment: (NOTE) The Xpert Xpress SARS-CoV-2/FLU/RSV plus assay is intended as an aid in the diagnosis of influenza from Nasopharyngeal swab specimens and should not be used as a sole basis for treatment. Nasal washings and aspirates are unacceptable for Xpert Xpress SARS-CoV-2/FLU/RSV testing.  Fact Sheet for Patients: BloggerCourse.com  Fact Sheet for Healthcare Providers: SeriousBroker.it  This  test is not yet approved or cleared by the [place] FDA and has been authorized for detection and/or diagnosis of SARS-CoV-2 by FDA under an Emergency Use Authorization (EUA). This EUA will remain in effect (meaning this test can be used) for the duration of the COVID-19 declaration under Section 564(b)(1) of the Act, 21 U.S.C. section 360bbb-3(b)(1), unless the authorization is terminated or revoked.     Resp Syncytial Virus by PCR 03/21/2022 NEGATIVE  NEGATIVE Final   Comment: (NOTE) Fact Sheet for Patients: BloggerCourse.com  Fact Sheet for Healthcare Providers: SeriousBroker.it  This test is not yet approved or cleared by the [place] FDA and has been authorized for detection and/or diagnosis of SARS-CoV-2 by FDA under an Emergency Use Authorization (EUA). This EUA will remain in effect (meaning this test can be used) for the duration of the COVID-19 declaration under Section 564(b)(1) of the Act, 21 U.S.C. section 360bbb-3(b)(1), unless the authorization is terminated or revoked.  Performed at [facility], [address], [place], [place] [address]   Admission on 01/18/2022, Discharged on 01/18/2022  Component Date Value Ref Range Status   WBC 01/18/2022 5.9  4.0 - 10.5 K/uL Final   RBC 01/18/2022 4.87  3.87 - 5.11 MIL/uL Final   Hemoglobin 01/18/2022 14.5  12.0 - 15.0 g/dL Final   HCT 60/45/4098 42.3  36.0 - 46.0 % Final   MCV 01/18/2022 86.9  80.0 - 100.0 fL Final   MCH 01/18/2022 29.8  26.0 - 34.0 pg Final   MCHC 01/18/2022 34.3  30.0 - 36.0 g/dL Final   RDW 11/91/4782 17.6 (H)  11.5 - 15.5 % Final   Platelets 01/18/2022 348  150 - 400 K/uL Final   nRBC 01/18/2022 0.0  0.0 - 0.2 % Final   Performed at [facility], [address] [place], [place] [address]   Troponin I (High Sensitivity) 01/18/2022 3  <18 ng/L Final   Comment: (NOTE) Elevated high sensitivity troponin I (hsTnI) values and significant   changes across serial measurements may suggest ACS but many other  chronic and acute conditions are known to elevate hsTnI results.  Refer to the "Links" section for chest pain algorithms and additional  guidance. Performed at [facility], [address] [place], [place] [address]    Sodium 01/18/2022 139  135 - 145 mmol/L Final   Potassium 01/18/2022 3.6  3.5 - 5.1 mmol/L Final   Chloride 01/18/2022 104  98 - 111 mmol/L Final   CO2 01/18/2022 17 (L)  22 - 32 mmol/L Final   Glucose, Bld 01/18/2022 72  70 - 99 mg/dL Final   Glucose reference range applies only to samples taken after fasting for at least 8 hours.   BUN 01/18/2022 11  6 - 20 mg/dL Final   Creatinine, Ser 01/18/2022 0.90  0.44 - 1.00 mg/dL Final   Calcium 01/18/2022 8.8 (L)  8.9 - 10.3 mg/dL Final   Total Protein 01/18/2022 7.5  6.5 - 8.1 g/dL Final   Albumin 01/18/2022 3.9  3.5 - 5.0 g/dL Final   AST 01/18/2022 35  15 - 41 U/L Final   ALT 01/18/2022 19  0 - 44 U/L Final   Alkaline Phosphatase 01/18/2022 86  38 - 126 U/L Final   Total Bilirubin 01/18/2022 0.4  0.3 - 1.2 mg/dL Final   GFR, Estimated 01/18/2022 >60  >60 mL/min Final   Comment: (NOTE) Calculated using the CKD-EPI Creatinine Equation (2021)    Anion gap 01/18/2022 18 (H)  5 - 15 Final   Performed at [facility], [address] [place], [place] [address]   Troponin I (High Sensitivity) 01/18/2022 4  <18 ng/L Final   Comment: (NOTE) Elevated high sensitivity troponin I (hsTnI) values and significant  changes across serial measurements may suggest ACS but many other  chronic and acute conditions are known to elevate hsTnI results.  Refer to the "Links" section for chest pain algorithms and additional  guidance. Performed at [facility], [address] [place], [place] [address]     Blood Alcohol level:  Lab Results  Component Value Date   ETH 142 (H) 03/20/2022   ETH 133 (H) 32/44/0102    Metabolic Disorder Labs: No results found for:  "HGBA1C", "MPG" No results found for: "PROLACTIN" No results found for: "CHOL", "TRIG", "HDL", "CHOLHDL", "VLDL", "LDLCALC"  Therapeutic Lab Levels: No results found for: "LITHIUM" No results found for: "VALPROATE" No results found for: "CBMZ"  Physical Findings   Mini-Mental    [place] Office Visit from 08/11/2021 in [facility]  Total Score (max 30 points ) 26      PHQ2-9    [place] ED from 03/21/2022 in [facility] ED from 06/04/2021 in [place]  PHQ-2 Total Score 2 2  PHQ-9 Total Score 3 4      [place] ED from 03/21/2022 in [facility] ED from 03/20/2022 in [place] [facility] from 03/14/2022 in [place] No Risk High Risk Moderate Risk        Musculoskeletal  Strength & Muscle Tone: within normal limits Gait & Station: normal Patient leans: N/A  Psychiatric Specialty Exam  Presentation  General Appearance:  Casual  Eye Contact: Fair  Speech: Clear and Coherent  Speech Volume: normal  Mood and Affect  Mood: "Im good"  Affect: Appropriate; Congruent   Thought Process  Thought Processes: Coherent; Goal Directed  Descriptions of Associations:Intact  Orientation:Full (Time, Place and Person)  Thought Content:Logical; WDL  Diagnosis  of Schizophrenia or Schizoaffective disorder in past: No    Hallucinations:none Ideas of Reference:None  Suicidal Thoughts: none Homicidal Thoughts: none  Sensorium  Memory: Immediate Fair; Recent Fair  Judgment: Fair  Insight: fair   Executive Functions  Concentration: Fair  Attention Span: Fair  Recall: FiservFair  Fund of Knowledge: Fair  Language: Fair   Psychomotor Activity  Psychomotor Activity: normal  Assets  Assets: Desire for Improvement; Resilience   Sleep  Sleep: fair  Physical Exam  Physical  Exam Constitutional:      Appearance: the patient is not toxic-appearing.  Pulmonary:     Effort: Pulmonary effort is normal.  Neurological:     General: No focal deficit present.     Mental Status: the patient is alert and oriented to person, place, and time.   Review of Systems  Respiratory:  Negative for shortness of breath.   Cardiovascular:  Negative for chest pain.  Gastrointestinal:  Negative for abdominal pain, constipation, diarrhea, nausea and vomiting.  Neurological:  Negative for headaches.   Blood pressure 125/83, pulse 85, temperature 98.4 F (36.9 C), temperature source Tympanic, resp. rate 18, last menstrual period 03/13/2022, SpO2 98 %. There is no height or weight on file to calculate BMI.  Treatment Plan Summary: Daily contact with patient to assess and evaluate symptoms and progress in treatment and Medication management send Status: Voluntary  Alcohol use disorder with depression - Continue Celexa for mood - Continue gabapentin and Librium taper for alcohol withdrawal  Medical: - Repeat potassium was 4.0 mmol/L - Hepatitis Panel  Reactive to HCV Ab- patient aware of this.  Follow-up with PCP.  HCV RNA quantitative pending  Dispo: Follow-up with [facility], January 10th 9 AM  Karsten RoVandana  Kiven Vangilder, MD PGY3 03/24/2022 2:27 PM

## 2022-03-24 NOTE — ED Notes (Signed)
Pt requested for vistaril to help with anxiety.

## 2022-03-24 NOTE — ED Notes (Addendum)
Pt sitting in dayroom watching tv in no acute distress. Denies concerns at present. Denies SI/HI/AVH. Environment secured. Will continue to monitor for safety.

## 2022-03-24 NOTE — ED Notes (Signed)
Pt is in the bed sleeping. Respirations are even and unlabored. No acute distress noted. Will continue to monitor for safety. 

## 2022-03-24 NOTE — ED Notes (Signed)
Pt sitting in dayroom watching tv with another peer. No acute distress noted or voiced. Safety maintained.

## 2022-03-24 NOTE — ED Notes (Signed)
Pt sitting in the dayroom in no acute distress. Denies needs or concerns. Informed pt to notify staff with any assistance. Will continue to monitor for safety.

## 2022-03-24 NOTE — Group Note (Signed)
Group Topic: Emotional Regulation  Group Date: 03/24/2022 Start Time: 0930 End Time: 1000 Facilitators: Mervyn Skeeters D, NT  Department: [facility]  Number of Participants: 6  Group Focus: anger management Treatment Modality:  Psychoeducation Interventions utilized were leisure development Purpose: express feelings  Name: Hannah Rhodes Date of Birth: 1981/02/11  MR: [id number]    Level of Participation: moderate Quality of Participation: cooperative Interactions with others: gave feedback Mood/Affect: appropriate Triggers (if applicable): NA Cognition: coherent/clear Progress: Moderate Response: NA Plan: follow-up needed  Patients Problems:  Patient Active Problem List   Diagnosis Date Noted   Alcohol abuse with alcohol-induced mood disorder ([place]) 03/21/2022   Portal hypertension ([place]) 06/05/2021   Hypophosphatemia 01/28/2020   Hypocalcemia 01/28/2020   COVID    Lactic acidosis 01/27/2020   COPD (chronic obstructive pulmonary disease) ([place])    Hypertension    Pancreatic cyst 07/22/2019   Chronic abdominal pain 07/22/2019   Abnormal LFTs 07/22/2019   Abnormal MRI of abdomen 07/22/2019   Bile duct stricture 07/22/2019   Common bile duct dilation 07/22/2019   Hepatitis C, chronic ([place]) 05/06/2019   Abnormal ultrasound of abdomen 05/06/2019   Hepatitis C antibody positive in blood 03/10/2019   Alcoholism ([place]) 03/10/2019   Loss of weight 03/10/2019   SOB (shortness of breath) 10/24/2009   ANXIETY STATE, UNSPECIFIED 10/21/2009   NONDEPENDENT TOBACCO USE DISORDER 10/21/2009   INSOMNIA UNSPECIFIED 10/21/2009   PALPITATIONS 10/21/2009

## 2022-03-24 NOTE — ED Notes (Signed)
Pt sitting in dining room watching TV. A&O x4, calm and cooperative. Denies current SI/HI/AVH. No signs of distress noted. Monitoring for safety.  

## 2022-03-25 DIAGNOSIS — Z9152 Personal history of nonsuicidal self-harm: Secondary | ICD-10-CM | POA: Diagnosis not present

## 2022-03-25 DIAGNOSIS — Z79899 Other long term (current) drug therapy: Secondary | ICD-10-CM | POA: Diagnosis not present

## 2022-03-25 DIAGNOSIS — F1094 Alcohol use, unspecified with alcohol-induced mood disorder: Secondary | ICD-10-CM | POA: Diagnosis not present

## 2022-03-25 DIAGNOSIS — F17210 Nicotine dependence, cigarettes, uncomplicated: Secondary | ICD-10-CM | POA: Diagnosis not present

## 2022-03-25 NOTE — ED Notes (Signed)
Pt sitting in dayroom watching tv and interacting with another peer. No acute distress noted. Informed pt to notify staff with any needs or concerns. Pt verbalized agreement. Will continue to monitor for safety.

## 2022-03-25 NOTE — ED Provider Notes (Signed)
Behavioral Health Progress Note  Date and Time: 03/25/2022 10:20 AM Name: Hannah Rhodes MRN:  [id number]  Subjective:   Hannah Rhodes is a 42 year old female with a past psychiatric history significant for alcohol use disorder presenting to [facility] for complaint of worsening anxiety, depression, and alcohol use, and admitted to [facility] for help regarding her alcohol use disorder. She is scheduled for an appointment with [facility] in [place] for January 10, where she will have access to intensive outpatient resources following the appointment.   On assessment today, she reports her mood is stable. She called her home and got to know that her daughter is currently stable.  She reports feeling little tired today. She woke up early morning as the other patient was sent to ED. Reports stable appetite. Denies suicidal ideation, homicidal ideations, and auditory and visual hallucinations. Denies any withdrawals and cravings for alcohol today.  Denies any other somatic symptoms.  Discussed the results of quantitative HCV that it was nondetectable.  She will still follow-up with PCP for regular monitoring.  Denies any side effects to currently prescribed psychiatric medications. She will follow-up with [facility] in [place] on 1/10 for intensive outpatient resources.   Diagnosis:  Final diagnoses:  Alcohol-induced mood disorder ([place])    Total Time spent with patient: 20 minutes  Past Psychiatric History: as above Past Medical History:  Past Medical History:  Diagnosis Date   Alcoholism ([place])    Anxiety    Chronic hepatitis C ([place])    Coma ([place])    while pregnant   COPD (chronic obstructive pulmonary disease) (HCC)    no inaler   DDD (degenerative disc disease), lumbar    Fatty liver    Generalized headaches    GERD (gastroesophageal reflux disease)    diet controlled, no med   Hepatitis C    Heroin use    HPV (human papilloma virus) infection    Hypertension    Insomnia    Migraine    Panic  attack    Pneumonia 03/2010   Smoker    Tachycardia     Past Surgical History:  Procedure Laterality Date   BIOPSY  09/28/2019   Procedure: BIOPSY;  Surgeon: Irving Copas., MD;  Location: [place];  Service: Gastroenterology;;   CESAREAN SECTION     CESAREAN SECTION     ESOPHAGOGASTRODUODENOSCOPY (EGD) WITH PROPOFOL N/A 09/28/2019   Procedure: ESOPHAGOGASTRODUODENOSCOPY (EGD) WITH PROPOFOL;  Surgeon: Irving Copas., MD;  Location: [facility] ENDOSCOPY;  Service: Gastroenterology;  Laterality: N/A;   EUS  09/28/2019   Procedure: UPPER ENDOSCOPIC ULTRASOUND (EUS) LINEAR;  Surgeon: Irving Copas., MD;  Location: [place];  Service: Gastroenterology;;   EUS  09/28/2019   Procedure: FULL UPPER ENDOSCOPIC ULTRASOUND (EUS) RADIAL;  Surgeon: Irving Copas., MD;  Location: [place];  Service: Gastroenterology;;   FINE NEEDLE ASPIRATION  09/28/2019   Procedure: FINE NEEDLE ASPIRATION (FNA) LINEAR;  Surgeon: Irving Copas., MD;  Location: [facility] ENDOSCOPY;  Service: Gastroenterology;;   SMALL INTESTINE SURGERY     WISDOM TOOTH EXTRACTION     Family History:  Family History  Problem Relation Age of Onset   Heart failure Mother    CAD Mother    Heart attack Mother    Anxiety disorder Sister    Restless legs syndrome Sister    Dementia Maternal Grandmother    Dementia Maternal Grandfather    Colon cancer Neg Hx    Esophageal cancer Neg Hx    Inflammatory bowel disease  Neg Hx    Liver disease Neg Hx    Pancreatic cancer Neg Hx    Stomach cancer Neg Hx    Rectal cancer Neg Hx    Family Psychiatric  History: none petinent Social History:  Social History   Substance and Sexual Activity  Alcohol Use Yes   Comment: daily , beer, liquor and wine     Social History   Substance and Sexual Activity  Drug Use Not Currently   Types: Marijuana, Heroin, Oxycodone, Hydrocodone, Cocaine   Comment: nothing at this time (last month was last use 07/01/21)     Social History   Socioeconomic History   Marital status: Single    Spouse name: Not on file   Number of children: 7   Years of education: Not on file   Highest education level: Not on file  Occupational History   Occupation: Homemaker  Tobacco Use   Smoking status: Every Day    Packs/day: 0.50    Years: 24.00    Total pack years: 12.00    Types: Cigarettes   Smokeless tobacco: Never   Tobacco comments:    Cut back to 5 cig/day  Vaping Use   Vaping Use: Never used  Substance and Sexual Activity   Alcohol use: Yes    Comment: daily , beer, liquor and wine   Drug use: Not Currently    Types: Marijuana, Heroin, Oxycodone, Hydrocodone, Cocaine    Comment: nothing at this time (last month was last use 07/01/21)   Sexual activity: Not Currently  Other Topics Concern   Not on file  Social History Narrative   Not on file   Social Determinants of Health   Financial Resource Strain: Not on file  Food Insecurity: Not on file  Transportation Needs: Not on file  Physical Activity: Not on file  Stress: Not on file  Social Connections: Not on file   SDOH:  SDOH Screenings   Depression (PHQ2-9): Low Risk  (03/23/2022)  Recent Concern: Depression (PHQ2-9) - High Risk (03/21/2022)  Tobacco Use: High Risk (03/21/2022)   Sleep: Fair  Appetite:  Fair  Current Medications:  Current Facility-Administered Medications  Medication Dose Route Frequency Provider Last Rate Last Admin   acetaminophen (TYLENOL) tablet 650 mg  650 mg Oral Q6H PRN Lenard Lance, FNP   650 mg at 03/22/22 0629   alum & mag hydroxide-simeth (MAALOX/MYLANTA) 200-200-20 MG/5ML suspension 30 mL  30 mL Oral Q4H PRN Lenard Lance, FNP       citalopram (CELEXA) tablet 20 mg  20 mg Oral Daily Lamar Sprinkles, MD   20 mg at 03/25/22 0932   gabapentin (NEURONTIN) capsule 100 mg  100 mg Oral TID Lamar Sprinkles, MD   100 mg at 03/25/22 0932   hydrocortisone (ANUSOL-HC) 2.5 % rectal cream   Rectal BID Onuoha, Chinwendu V,  NP   Given at 03/22/22 2105   hydrOXYzine (ATARAX) tablet 25 mg  25 mg Oral TID PRN Lenard Lance, FNP   25 mg at 03/23/22 2114   magnesium hydroxide (MILK OF MAGNESIA) suspension 30 mL  30 mL Oral Daily PRN Lenard Lance, FNP       multivitamin with minerals tablet 1 tablet  1 tablet Oral Daily Lenard Lance, FNP   1 tablet at 03/25/22 0932   nicotine (NICODERM CQ - dosed in mg/24 hours) patch 14 mg  14 mg Transdermal Daily Lenard Lance, FNP   14 mg at 03/25/22 0932  thiamine (VITAMIN B1) tablet 100 mg  100 mg Oral Daily Lenard Lance, FNP   100 mg at 03/25/22 0932   traZODone (DESYREL) tablet 50 mg  50 mg Oral QHS PRN Lenard Lance, FNP       Current Outpatient Medications  Medication Sig Dispense Refill   albuterol (VENTOLIN HFA) 108 (90 Base) MCG/ACT inhaler Inhale 1-2 puffs into the lungs every 6 (six) hours as needed for wheezing or shortness of breath. 6.7 g 0   hydrOXYzine (ATARAX) 25 MG tablet Take by mouth.     Melatonin 10 MG CAPS Take 20 tablets by mouth at bedtime as needed (sleep).      Labs  Lab Results:  Admission on 03/21/2022  Component Date Value Ref Range Status   Sodium 03/21/2022 131 (L)  135 - 145 mmol/L Final   Potassium 03/21/2022 3.3 (L)  3.5 - 5.1 mmol/L Final   Chloride 03/21/2022 98  98 - 111 mmol/L Final   CO2 03/21/2022 21 (L)  22 - 32 mmol/L Final   Glucose, Bld 03/21/2022 160 (H)  70 - 99 mg/dL Final   Glucose reference range applies only to samples taken after fasting for at least 8 hours.   BUN 03/21/2022 5 (L)  6 - 20 mg/dL Final   Creatinine, Ser 03/21/2022 0.89  0.44 - 1.00 mg/dL Final   Calcium 40/12/2723 8.7 (L)  8.9 - 10.3 mg/dL Final   GFR, Estimated 03/21/2022 >60  >60 mL/min Final   Comment: (NOTE) Calculated using the CKD-EPI Creatinine Equation (2021)    Anion gap 03/21/2022 12  5 - 15 Final   Performed at [facility], [address] [place], [place] [address]   Potassium 03/22/2022 4.0  3.5 - 5.1 mmol/L Final   Performed at  [facility], [address], [place], [place] [address]   Hepatitis B Surface Ag 03/22/2022 NON REACTIVE  NON REACTIVE Final   HCV Ab 03/22/2022 Reactive (A)  NON REACTIVE Final   Comment: (NOTE) The CDC recommends that a Reactive HCV antibody result be followed up  with a HCV Nucleic Acid Amplification test.     Hep A IgM 03/22/2022 NON REACTIVE  NON REACTIVE Final   Hep B C IgM 03/22/2022 NON REACTIVE  NON REACTIVE Final   Performed at [facility], [address], [place], [place] [address]   HCV Quantitative 03/23/2022 HCV Not Detected  >50 IU/mL Final   Test Information 03/23/2022 Comment   Final   Comment: (NOTE) The quantitative range of this assay is 15 IU/mL to 100 million IU/mL. Performed At: [facility] [address] [place], [place] [id number] Jolene Schimke MD RJ:[id number]   Admission on 03/20/2022, Discharged on 03/21/2022  Component Date Value Ref Range Status   Sodium 03/20/2022 134 (L)  135 - 145 mmol/L Final   Potassium 03/20/2022 2.9 (L)  3.5 - 5.1 mmol/L Final   Chloride 03/20/2022 101  98 - 111 mmol/L Final   CO2 03/20/2022 22  22 - 32 mmol/L Final   Glucose, Bld 03/20/2022 126 (H)  70 - 99 mg/dL Final   Glucose reference range applies only to samples taken after fasting for at least 8 hours.   BUN 03/20/2022 10  6 - 20 mg/dL Final   Creatinine, Ser 03/20/2022 0.70  0.44 - 1.00 mg/dL Final   Calcium 01/60/1093 8.7 (L)  8.9 - 10.3 mg/dL Final   Total Protein 23/55/7322 7.6  6.5 - 8.1 g/dL  Final   Albumin 03/20/2022 4.2  3.5 - 5.0 g/dL Final   AST 03/20/2022 65 (H)  15 - 41 U/L Final   ALT 03/20/2022 22  0 - 44 U/L Final   Alkaline Phosphatase 03/20/2022 100  38 - 126 U/L Final   Total Bilirubin 03/20/2022 0.4  0.3 - 1.2 mg/dL Final   GFR, Estimated 03/20/2022 >60  >60 mL/min Final   Comment: (NOTE) Calculated using the CKD-EPI Creatinine Equation (2021)    Anion gap 03/20/2022 11  5 - 15 Final   Performed at [facility],  [address], [place], [place] [address]   Alcohol, Ethyl (B) 03/20/2022 142 (H)  <10 mg/dL Final   Comment: (NOTE) Lowest detectable limit for serum alcohol is 10 mg/dL.  For medical purposes only. Performed at [facility], [address] [place], [place] [address]    WBC 03/20/2022 5.5  4.0 - 10.5 K/uL Final   RBC 03/20/2022 4.69  3.87 - 5.11 MIL/uL Final   Hemoglobin 03/20/2022 14.5  12.0 - 15.0 g/dL Final   HCT 03/20/2022 41.7  36.0 - 46.0 % Final   MCV 03/20/2022 88.9  80.0 - 100.0 fL Final   MCH 03/20/2022 30.9  26.0 - 34.0 pg Final   MCHC 03/20/2022 34.8  30.0 - 36.0 g/dL Final   RDW 03/20/2022 15.6 (H)  11.5 - 15.5 % Final   Platelets 03/20/2022 193  150 - 400 K/uL Final   nRBC 03/20/2022 0.0  0.0 - 0.2 % Final   Performed at [facility], [address] [place],  [address]   Opiates 03/20/2022 NONE DETECTED  NONE DETECTED Final   Cocaine 03/20/2022 NONE DETECTED  NONE DETECTED Final   Benzodiazepines 03/20/2022 NONE DETECTED  NONE DETECTED Final   Amphetamines 03/20/2022 NONE DETECTED  NONE DETECTED Final   Tetrahydrocannabinol 03/20/2022 NONE DETECTED  NONE DETECTED Final   Barbiturates 03/20/2022 NONE DETECTED  NONE DETECTED Final   Comment: (NOTE) DRUG SCREEN FOR MEDICAL PURPOSES ONLY.  IF CONFIRMATION IS NEEDED FOR ANY PURPOSE, NOTIFY LAB WITHIN 5 DAYS.  LOWEST DETECTABLE LIMITS FOR URINE DRUG SCREEN Drug Class                     Cutoff (ng/mL) Amphetamine and metabolites    1000 Barbiturate and metabolites    200 Benzodiazepine                 200 Opiates and metabolites        300 Cocaine and metabolites        300 THC                            50 Performed at [facility], [address], [place],  [address]    Preg Test, Ur 03/20/2022 Negative  Negative Final   SARS Coronavirus 2 by RT PCR 03/21/2022 NEGATIVE  NEGATIVE Final   Comment: (NOTE) SARS-CoV-2 target nucleic acids are NOT DETECTED.  The SARS-CoV-2 RNA is generally detectable in upper  respiratory specimens during the acute phase of infection. The lowest concentration of SARS-CoV-2 viral copies this assay can detect is 138 copies/mL. A negative result does not preclude SARS-Cov-2 infection and should not be used as the sole basis for treatment or other patient management decisions. A negative result may occur with  improper specimen collection/handling, submission of specimen other than nasopharyngeal swab, presence of viral mutation(s) within the areas targeted by this assay,  and inadequate number of viral copies(<138 copies/mL). A negative result must be combined with clinical observations, patient history, and epidemiological information. The expected result is Negative.  Fact Sheet for Patients:  EntrepreneurPulse.com.au  Fact Sheet for Healthcare Providers:  IncredibleEmployment.be  This test is no                          t yet approved or cleared by the [place] FDA and  has been authorized for detection and/or diagnosis of SARS-CoV-2 by FDA under an Emergency Use Authorization (EUA). This EUA will remain  in effect (meaning this test can be used) for the duration of the COVID-19 declaration under Section 564(b)(1) of the Act, 21 U.S.C.section 360bbb-3(b)(1), unless the authorization is terminated  or revoked sooner.       Influenza A by PCR 03/21/2022 NEGATIVE  NEGATIVE Final   Influenza B by PCR 03/21/2022 NEGATIVE  NEGATIVE Final   Comment: (NOTE) The Xpert Xpress SARS-CoV-2/FLU/RSV plus assay is intended as an aid in the diagnosis of influenza from Nasopharyngeal swab specimens and should not be used as a sole basis for treatment. Nasal washings and aspirates are unacceptable for Xpert Xpress SARS-CoV-2/FLU/RSV testing.  Fact Sheet for Patients: EntrepreneurPulse.com.au  Fact Sheet for Healthcare Providers: IncredibleEmployment.be  This test is not yet approved or  cleared by the [place] FDA and has been authorized for detection and/or diagnosis of SARS-CoV-2 by FDA under an Emergency Use Authorization (EUA). This EUA will remain in effect (meaning this test can be used) for the duration of the COVID-19 declaration under Section 564(b)(1) of the Act, 21 U.S.C. section 360bbb-3(b)(1), unless the authorization is terminated or revoked.     Resp Syncytial Virus by PCR 03/21/2022 NEGATIVE  NEGATIVE Final   Comment: (NOTE) Fact Sheet for Patients: EntrepreneurPulse.com.au  Fact Sheet for Healthcare Providers: IncredibleEmployment.be  This test is not yet approved or cleared by the [place] FDA and has been authorized for detection and/or diagnosis of SARS-CoV-2 by FDA under an Emergency Use Authorization (EUA). This EUA will remain in effect (meaning this test can be used) for the duration of the COVID-19 declaration under Section 564(b)(1) of the Act, 21 U.S.C. section 360bbb-3(b)(1), unless the authorization is terminated or revoked.  Performed at [facility], [address] [place], [place] [address]   Admission on 01/18/2022, Discharged on 01/18/2022  Component Date Value Ref Range Status   WBC 01/18/2022 5.9  4.0 - 10.5 K/uL Final   RBC 01/18/2022 4.87  3.87 - 5.11 MIL/uL Final   Hemoglobin 01/18/2022 14.5  12.0 - 15.0 g/dL Final   HCT 01/18/2022 42.3  36.0 - 46.0 % Final   MCV 01/18/2022 86.9  80.0 - 100.0 fL Final   MCH 01/18/2022 29.8  26.0 - 34.0 pg Final   MCHC 01/18/2022 34.3  30.0 - 36.0 g/dL Final   RDW 01/18/2022 17.6 (H)  11.5 - 15.5 % Final   Platelets 01/18/2022 348  150 - 400 K/uL Final   nRBC 01/18/2022 0.0  0.0 - 0.2 % Final   Performed at [facility], [address], [place], [place] [address]   Troponin I (High Sensitivity) 01/18/2022 3  <18 ng/L Final   Comment: (NOTE) Elevated high sensitivity troponin I (hsTnI) values and significant  changes across serial measurements  may suggest ACS but many other  chronic and acute conditions are known to elevate hsTnI results.  Refer to the "Links" section for chest pain algorithms and  additional  guidance. Performed at [facility], [address], [place], [place] [address]    Sodium 01/18/2022 139  135 - 145 mmol/L Final   Potassium 01/18/2022 3.6  3.5 - 5.1 mmol/L Final   Chloride 01/18/2022 104  98 - 111 mmol/L Final   CO2 01/18/2022 17 (L)  22 - 32 mmol/L Final   Glucose, Bld 01/18/2022 72  70 - 99 mg/dL Final   Glucose reference range applies only to samples taken after fasting for at least 8 hours.   BUN 01/18/2022 11  6 - 20 mg/dL Final   Creatinine, Ser 01/18/2022 0.90  0.44 - 1.00 mg/dL Final   Calcium 01/18/2022 8.8 (L)  8.9 - 10.3 mg/dL Final   Total Protein 01/18/2022 7.5  6.5 - 8.1 g/dL Final   Albumin 01/18/2022 3.9  3.5 - 5.0 g/dL Final   AST 01/18/2022 35  15 - 41 U/L Final   ALT 01/18/2022 19  0 - 44 U/L Final   Alkaline Phosphatase 01/18/2022 86  38 - 126 U/L Final   Total Bilirubin 01/18/2022 0.4  0.3 - 1.2 mg/dL Final   GFR, Estimated 01/18/2022 >60  >60 mL/min Final   Comment: (NOTE) Calculated using the CKD-EPI Creatinine Equation (2021)    Anion gap 01/18/2022 18 (H)  5 - 15 Final   Performed at [facility], [address], [place], [place] [address]   Troponin I (High Sensitivity) 01/18/2022 4  <18 ng/L Final   Comment: (NOTE) Elevated high sensitivity troponin I (hsTnI) values and significant  changes across serial measurements may suggest ACS but many other  chronic and acute conditions are known to elevate hsTnI results.  Refer to the "Links" section for chest pain algorithms and additional  guidance. Performed at [facility], [address] [place], [place] [address]     Blood Alcohol level:  Lab Results  Component Value Date   ETH 142 (H) 03/20/2022   ETH 133 (H) 123XX123    Metabolic Disorder Labs: No results found for: "HGBA1C", "MPG" No results found for:  "PROLACTIN" No results found for: "CHOL", "TRIG", "HDL", "CHOLHDL", "VLDL", "LDLCALC"  Therapeutic Lab Levels: No results found for: "LITHIUM" No results found for: "VALPROATE" No results found for: "CBMZ"  Physical Findings   Mini-Mental    [place] Office Visit from 08/11/2021 in [facility]  Total Score (max 30 points ) 26      PHQ2-9    [place] ED from 03/21/2022 in [facility] ED from 06/04/2021 in [place]  PHQ-2 Total Score 2 2  PHQ-9 Total Score 3 4      [place] ED from 03/21/2022 in [facility] ED from 03/20/2022 in [place] [facility] from 03/14/2022 in [place] No Risk High Risk Moderate Risk        Musculoskeletal  Strength & Muscle Tone: within normal limits Gait & Station: normal Patient leans: N/A  Psychiatric Specialty Exam  Presentation  General Appearance:  Casual  Eye Contact: Fair  Speech: Clear and Coherent  Speech Volume: normal  Mood and Affect  Mood: "better"  Affect: Appropriate; Congruent   Thought Process  Thought Processes: Coherent; Goal Directed  Descriptions of Associations:Intact  Orientation:Full (Time, Place and Person)  Thought Content:Logical; WDL  Diagnosis of Schizophrenia or Schizoaffective disorder in past: No    Hallucinations:none Ideas of Reference:None  Suicidal Thoughts: none Homicidal Thoughts: none  Sensorium  Memory: Immediate Fair; Recent Fair  Judgment: Fair  Insight: fair   Executive Functions  Concentration: Fair  Attention Span: Fair  Recall: [employer] of Knowledge: Fair  Language: Fair   Psychomotor Activity  Psychomotor Activity: normal  Assets  Assets: Desire for Improvement; Resilience   Sleep  Sleep: fair  Physical Exam  Physical Exam Constitutional:      Appearance: the  patient is not toxic-appearing.  Pulmonary:     Effort: Pulmonary effort is normal.  Neurological:     General: No focal deficit present.     Mental Status: the patient is alert and oriented to person, place, and time.   Review of Systems  Respiratory:  Negative for shortness of breath.   Cardiovascular:  Negative for chest pain.  Gastrointestinal:  Negative for abdominal pain, constipation, diarrhea, nausea and vomiting.  Neurological:  Negative for headaches.   Blood pressure 123/86, pulse 90, temperature 97.9 F (36.6 C), temperature source Tympanic, resp. rate 16, last menstrual period 03/13/2022, SpO2 98 %. There is no height or weight on file to calculate BMI.  Treatment Plan Summary: Daily contact with patient to assess and evaluate symptoms and progress in treatment and Medication management  Status: Voluntary  Alcohol use disorder with depression - Continue Celexa for mood - Continue gabapentin and Librium taper for alcohol withdrawal  Medical: - Repeat potassium was 4.0 mmol/L - Hepatitis Panel  Reactive to HCV Ab- Patient aware of this. HCV Not detected on HCB Quantitative.   Dispo: Follow-up with [facility], January 10th 9 AM  Armando Reichert, MD PGY3 03/25/2022 10:20 AM

## 2022-03-25 NOTE — ED Notes (Signed)
Pt sitting in dining room watching TV. A&O x4, calm and cooperative. Denies current SI/HI/AVH. No signs of distress noted. Monitoring for safety.  

## 2022-03-25 NOTE — ED Notes (Signed)
Pt asleep in bed. Respirations even and unlabored. Monitoring for safety. 

## 2022-03-25 NOTE — ED Notes (Signed)
Patient A&Ox4. Denies intent to harm self/others when asked. Denies A/VH. Patient denies any physical complaints when asked. No acute distress noted. Routine safety checks conducted according to facility protocol. Encouraged patient to notify staff if thoughts of harm toward self or others arise. Patient verbalize understanding and agreement. Will continue to monitor for safety.    

## 2022-03-26 ENCOUNTER — Encounter (HOSPITAL_COMMUNITY): Payer: Self-pay

## 2022-03-26 DIAGNOSIS — F17210 Nicotine dependence, cigarettes, uncomplicated: Secondary | ICD-10-CM | POA: Diagnosis not present

## 2022-03-26 DIAGNOSIS — Z79899 Other long term (current) drug therapy: Secondary | ICD-10-CM | POA: Diagnosis not present

## 2022-03-26 DIAGNOSIS — Z9152 Personal history of nonsuicidal self-harm: Secondary | ICD-10-CM | POA: Diagnosis not present

## 2022-03-26 DIAGNOSIS — F1094 Alcohol use, unspecified with alcohol-induced mood disorder: Secondary | ICD-10-CM | POA: Diagnosis not present

## 2022-03-26 NOTE — ED Notes (Signed)
Snacks given 

## 2022-03-26 NOTE — ED Notes (Signed)
Pt attending AA meeting.  

## 2022-03-26 NOTE — ED Notes (Signed)
Pt is in the bed sleeping. Respirations are even and unlabored. No acute distress noted. Will continue to monitor for safety. 

## 2022-03-26 NOTE — Progress Notes (Signed)
Received Hannah Rhodes this AM asleep in her bed, she woke up on her own, received breakfast and later was compliant with her medications. She retrieved her washed clothes. She denied all of the psychiatric symptoms including feeling suicidal. She was medicated for a headache, which was her only compliant.

## 2022-03-26 NOTE — ED Notes (Signed)
Pt asleep in bed. Respirations even and unlabored. Monitoring for safety. 

## 2022-03-26 NOTE — BH IP Treatment Plan (Unsigned)
Interdisciplinary Treatment and Diagnostic Plan Update  03/26/2022 Time of Session: Hannah Rhodes MRN: [id number]  Diagnosis:  Final diagnoses:  Alcohol-induced mood disorder (HCC)     Current Medications:  Current Facility-Administered Medications  Medication Dose Route Frequency Provider Last Rate Last Admin   acetaminophen (TYLENOL) tablet 650 mg  650 mg Oral Q6H PRN Lenard Lance, FNP   650 mg at 03/22/22 0629   alum & mag hydroxide-simeth (MAALOX/MYLANTA) 200-200-20 MG/5ML suspension 30 mL  30 mL Oral Q4H PRN Lenard Lance, FNP       citalopram (CELEXA) tablet 20 mg  20 mg Oral Daily Lamar Sprinkles, MD   20 mg at 03/25/22 0932   gabapentin (NEURONTIN) capsule 100 mg  100 mg Oral TID Lamar Sprinkles, MD   100 mg at 03/25/22 2106   hydrocortisone (ANUSOL-HC) 2.5 % rectal cream   Rectal BID Onuoha, Chinwendu V, NP   Given at 03/22/22 2105   hydrOXYzine (ATARAX) tablet 25 mg  25 mg Oral TID PRN Lenard Lance, FNP   25 mg at 03/23/22 2114   magnesium hydroxide (MILK OF MAGNESIA) suspension 30 mL  30 mL Oral Daily PRN Lenard Lance, FNP       multivitamin with minerals tablet 1 tablet  1 tablet Oral Daily Lenard Lance, FNP   1 tablet at 03/25/22 0932   nicotine (NICODERM CQ - dosed in mg/24 hours) patch 14 mg  14 mg Transdermal Daily Lenard Lance, FNP   14 mg at 03/25/22 0932   thiamine (VITAMIN B1) tablet 100 mg  100 mg Oral Daily Lenard Lance, FNP   100 mg at 03/25/22 0932   traZODone (DESYREL) tablet 50 mg  50 mg Oral QHS PRN Lenard Lance, FNP   50 mg at 03/25/22 2106   Current Outpatient Medications  Medication Sig Dispense Refill   albuterol (VENTOLIN HFA) 108 (90 Base) MCG/ACT inhaler Inhale 1-2 puffs into the lungs every 6 (six) hours as needed for wheezing or shortness of breath. 6.7 g 0   hydrOXYzine (ATARAX) 25 MG tablet Take by mouth.     Melatonin 10 MG CAPS Take 20 tablets by mouth at bedtime as needed (sleep).     PTA Medications: Prior to Admission medications    Medication Sig Start Date End Date Taking? Authorizing Provider  albuterol (VENTOLIN HFA) 108 (90 Base) MCG/ACT inhaler Inhale 1-2 puffs into the lungs every 6 (six) hours as needed for wheezing or shortness of breath. 09/12/20   Pricilla Riffle, MD  hydrOXYzine (ATARAX) 25 MG tablet Take by mouth. 03/16/22   [provider]  Melatonin 10 MG CAPS Take 20 tablets by mouth at bedtime as needed (sleep).    [provider]    Patient Stressors: {PATIENT STRESSORS:22669}  Patient Strengths: {PATIENT STRENGTHS:22666}  Treatment Modalities: Medication Management, Group therapy, Case management,  1 to 1 session with clinician, Psychoeducation, Recreational therapy.   Physician Treatment Plan for Primary and Secondary Diagnosis:  Final diagnoses:  Alcohol-induced mood disorder (HCC)   Long Term Goal(s): Improvement in symptoms so as ready for discharge  Short Term Goals: Patient will verbalize feelings in meetings with treatment team members. Patient will attend at least of 50% of the groups daily. Pt will complete the PHQ9 on admission, day 3 and discharge. Patient will participate in completing the [place] Suicide Severity Rating Scale Patient will score a low risk of violence for 24 hours prior to discharge Patient will take medications as  prescribed daily.  Medication Management: Evaluate patient's response, side effects, and tolerance of medication regimen.  Therapeutic Interventions: 1 to 1 sessions, Unit Group sessions and Medication administration.  Evaluation of Outcomes: {[facility] Tx Plan Outcomes:30414004}  LCSW Treatment Plan for Primary Diagnosis:  Final diagnoses:  Alcohol-induced mood disorder ([place])    Long Term Goal(s): Safe transition to appropriate next level of care at discharge.  Short Term Goals: {[facility]BC LCSW [place] Plan Short Term Goals:27590}  Therapeutic Interventions: Assess for all discharge needs, 1 to 1 time with Social worker, Explore available  resources and support systems, Assess for adequacy in community support network, Educate family and significant other(s) on suicide prevention, Complete Psychosocial Assessment, Interpersonal group therapy.  Evaluation of Outcomes: {[facility] Tx Plan Outcomes:30414004}   Progress in Treatment: Attending groups: {[facility] ADULT:22608} Participating in groups: {[facility] ADULT:22608} Taking medication as prescribed: {[facility] ADULT:22608} Toleration medication: {[facility] ADULT:22608} Family/Significant other contact made: {YES/NO/CONTACT:22665} Patient understands diagnosis: {[facility] RVUYE:33435} Discussing patient identified problems/goals with staff: {BHH WYSHU:83729} Medical problems stabilized or resolved: {[facility] ADULT:22608} Denies suicidal/homicidal ideation: {[facility] ADULT:22608} Issues/concerns per patient self-inventory: {[facility] ADULT:22608} Other: ***  New problem(s) identified: {[facility] NEW PROBLEMS:22609}  New Short Term/Long Term Goal(s):  Patient Goals:    Discharge Plan or Barriers:   Reason for Continuation of Hospitalization: {[facility] Reasons for continued hospitalization:22604}  Estimated Length of Stay:  Last [place] Suicide Severity Risk Score: [place] [facility] from 03/21/2022 in [facility] ED from 03/20/2022 in [place] [facility] from 03/14/2022 in [place] No Risk High Risk Moderate Risk       Last PHQ 2/9 Scores:    03/23/2022    2:21 PM 03/21/2022    1:24 PM 06/05/2021    6:24 AM  Depression screen PHQ 2/9  Decreased Interest 1 2 1   Down, Depressed, Hopeless 1 2 1   PHQ - 2 Score 2 4 2   Altered sleeping 0 3 0  Tired, decreased energy 0 1 1  Change in appetite 1 3 0  Feeling bad or failure about yourself  0 2 0  Trouble concentrating 0 2 1  Moving slowly or fidgety/restless 0 1 0  Suicidal thoughts 0 2 0  PHQ-9 Score 3 18 4   Difficult doing work/chores Somewhat difficult Very difficult Somewhat difficult     Scribe for Treatment Team: Kerri Perches, LCSW 03/26/2022 8:11 AM

## 2022-03-26 NOTE — ED Notes (Signed)
Pt sitting in dining room watching TV. A&O x4, calm and cooperative. Denies current SI/HI/AVH. No signs of distress noted. Monitoring for safety.  

## 2022-03-26 NOTE — ED Provider Notes (Signed)
Behavioral Health Progress Note  Date and Time: 03/26/2022 9:37 AM Name: Hannah Rhodes MRN:  [id number]  Subjective:   Hannah Rhodes is a 42 year old female with a past psychiatric history significant for alcohol use disorder presenting to [facility] for complaint of worsening anxiety, depression, and alcohol use, and admitted to [facility] for help regarding her alcohol use disorder. She is scheduled for an appointment with [facility] in [place] for January 10, where she will have access to intensive outpatient resources following the appointment.   Patient evaluated at bedside, reports 8/10 migraine this morning. Encouraged to request PRN Tylenol for headaches. Otherwise, denies any other somatic symptoms. Reports her mood is stable today, has stayed in touch with her daughter. Endorses adequate sleep and appetite. Denies any cravings or withdrawals from alcohol, only endorses cravings for cigarettes. Denies side effects to currently prescribed psychiatric medications.   She denies suicidal ideation, denies homicidal ideation.  She denies auditory visual hallucinations.  There are no apparent paranoid ideations.  There are no apparent delusional thought processes.  Diagnosis:  Final diagnoses:  Alcohol-induced mood disorder (HCC)    Total Time spent with patient: 20 minutes  Past Psychiatric History: as above Past Medical History:  Past Medical History:  Diagnosis Date   Alcoholism (HCC)    Anxiety    Chronic hepatitis C (HCC)    Coma (HCC)    while pregnant   COPD (chronic obstructive pulmonary disease) (HCC)    no inaler   DDD (degenerative disc disease), lumbar    Fatty liver    Generalized headaches    GERD (gastroesophageal reflux disease)    diet controlled, no med   Hepatitis C    Heroin use    HPV (human papilloma virus) infection    Hypertension    Insomnia    Migraine    Panic attack    Pneumonia 03/2010   Smoker    Tachycardia     Past Surgical History:  Procedure  Laterality Date   BIOPSY  09/28/2019   Procedure: BIOPSY;  Surgeon: Lemar Lofty., MD;  Location: [facility] ENDOSCOPY;  Service: Gastroenterology;;   CESAREAN SECTION     CESAREAN SECTION     ESOPHAGOGASTRODUODENOSCOPY (EGD) WITH PROPOFOL N/A 09/28/2019   Procedure: ESOPHAGOGASTRODUODENOSCOPY (EGD) WITH PROPOFOL;  Surgeon: Lemar Lofty., MD;  Location: [facility] ENDOSCOPY;  Service: Gastroenterology;  Laterality: N/A;   EUS  09/28/2019   Procedure: UPPER ENDOSCOPIC ULTRASOUND (EUS) LINEAR;  Surgeon: Lemar Lofty., MD;  Location: [facility] ENDOSCOPY;  Service: Gastroenterology;;   EUS  09/28/2019   Procedure: FULL UPPER ENDOSCOPIC ULTRASOUND (EUS) RADIAL;  Surgeon: Lemar Lofty., MD;  Location: [facility] ENDOSCOPY;  Service: Gastroenterology;;   FINE NEEDLE ASPIRATION  09/28/2019   Procedure: FINE NEEDLE ASPIRATION (FNA) LINEAR;  Surgeon: Lemar Lofty., MD;  Location: [facility] ENDOSCOPY;  Service: Gastroenterology;;   SMALL INTESTINE SURGERY     WISDOM TOOTH EXTRACTION     Family History:  Family History  Problem Relation Age of Onset   Heart failure Mother    CAD Mother    Heart attack Mother    Anxiety disorder Sister    Restless legs syndrome Sister    Dementia Maternal Grandmother    Dementia Maternal Grandfather    Colon cancer Neg Hx    Esophageal cancer Neg Hx    Inflammatory bowel disease Neg Hx    Liver disease Neg Hx    Pancreatic cancer Neg Hx    Stomach cancer Neg Hx  Rectal cancer Neg Hx    Family Psychiatric  History: none petinent Social History:  Social History   Substance and Sexual Activity  Alcohol Use Yes   Comment: daily , beer, liquor and wine     Social History   Substance and Sexual Activity  Drug Use Not Currently   Types: Marijuana, Heroin, Oxycodone, Hydrocodone, Cocaine   Comment: nothing at this time (last month was last use 07/01/21)    Social History   Socioeconomic History   Marital status: Single    Spouse name: Not on  file   Number of children: 7   Years of education: Not on file   Highest education level: Not on file  Occupational History   Occupation: Homemaker  Tobacco Use   Smoking status: Every Day    Packs/day: 0.50    Years: 24.00    Total pack years: 12.00    Types: Cigarettes   Smokeless tobacco: Never   Tobacco comments:    Cut back to 5 cig/day  Vaping Use   Vaping Use: Never used  Substance and Sexual Activity   Alcohol use: Yes    Comment: daily , beer, liquor and wine   Drug use: Not Currently    Types: Marijuana, Heroin, Oxycodone, Hydrocodone, Cocaine    Comment: nothing at this time (last month was last use 07/01/21)   Sexual activity: Not Currently  Other Topics Concern   Not on file  Social History Narrative   Not on file   Social Determinants of Health   Financial Resource Strain: Not on file  Food Insecurity: Not on file  Transportation Needs: Not on file  Physical Activity: Not on file  Stress: Not on file  Social Connections: Not on file   SDOH:  SDOH Screenings   Depression (PHQ2-9): Low Risk  (03/23/2022)  Recent Concern: Depression (PHQ2-9) - High Risk (03/21/2022)  Tobacco Use: High Risk (03/21/2022)   Sleep: Fair  Appetite:  Fair  Current Medications:  Current Facility-Administered Medications  Medication Dose Route Frequency Provider Last Rate Last Admin   acetaminophen (TYLENOL) tablet 650 mg  650 mg Oral Q6H PRN Lucky Rathke, FNP   650 mg at 03/22/22 0629   alum & mag hydroxide-simeth (MAALOX/MYLANTA) 200-200-20 MG/5ML suspension 30 mL  30 mL Oral Q4H PRN Lucky Rathke, FNP       citalopram (CELEXA) tablet 20 mg  20 mg Oral Daily Rosezetta Schlatter, MD   20 mg at 03/25/22 0932   gabapentin (NEURONTIN) capsule 100 mg  100 mg Oral TID Rosezetta Schlatter, MD   100 mg at 03/25/22 2106   hydrocortisone (ANUSOL-HC) 2.5 % rectal cream   Rectal BID Onuoha, Chinwendu V, NP   Given at 03/22/22 2105   hydrOXYzine (ATARAX) tablet 25 mg  25 mg Oral TID PRN Lucky Rathke, FNP   25 mg at 03/23/22 2114   magnesium hydroxide (MILK OF MAGNESIA) suspension 30 mL  30 mL Oral Daily PRN Lucky Rathke, FNP       multivitamin with minerals tablet 1 tablet  1 tablet Oral Daily Lucky Rathke, FNP   1 tablet at 03/25/22 0932   nicotine (NICODERM CQ - dosed in mg/24 hours) patch 14 mg  14 mg Transdermal Daily Lucky Rathke, FNP   14 mg at 03/25/22 0932   thiamine (VITAMIN B1) tablet 100 mg  100 mg Oral Daily Lucky Rathke, FNP   100 mg at 03/25/22 0932   traZODone ([place])  tablet 50 mg  50 mg Oral QHS PRN Lucky Rathke, FNP   50 mg at 03/25/22 2106   Current Outpatient Medications  Medication Sig Dispense Refill   albuterol (VENTOLIN HFA) 108 (90 Base) MCG/ACT inhaler Inhale 1-2 puffs into the lungs every 6 (six) hours as needed for wheezing or shortness of breath. 6.7 g 0   hydrOXYzine (ATARAX) 25 MG tablet Take by mouth.     Melatonin 10 MG CAPS Take 20 tablets by mouth at bedtime as needed (sleep).      Labs  Lab Results:  Admission on 03/21/2022  Component Date Value Ref Range Status   Sodium 03/21/2022 131 (L)  135 - 145 mmol/L Final   Potassium 03/21/2022 3.3 (L)  3.5 - 5.1 mmol/L Final   Chloride 03/21/2022 98  98 - 111 mmol/L Final   CO2 03/21/2022 21 (L)  22 - 32 mmol/L Final   Glucose, Bld 03/21/2022 160 (H)  70 - 99 mg/dL Final   Glucose reference range applies only to samples taken after fasting for at least 8 hours.   BUN 03/21/2022 5 (L)  6 - 20 mg/dL Final   Creatinine, Ser 03/21/2022 0.89  0.44 - 1.00 mg/dL Final   Calcium 03/21/2022 8.7 (L)  8.9 - 10.3 mg/dL Final   GFR, Estimated 03/21/2022 >60  >60 mL/min Final   Comment: (NOTE) Calculated using the CKD-EPI Creatinine Equation (2021)    Anion gap 03/21/2022 12  5 - 15 Final   Performed at [facility], [place] [address], [place], [place] [address]   Potassium 03/22/2022 4.0  3.5 - 5.1 mmol/L Final   Performed at [facility], [place] [address], [place], [place] [address]   Hepatitis B  Surface Ag 03/22/2022 NON REACTIVE  NON REACTIVE Final   HCV Ab 03/22/2022 Reactive (A)  NON REACTIVE Final   Comment: (NOTE) The CDC recommends that a Reactive HCV antibody result be followed up  with a HCV Nucleic Acid Amplification test.     Hep A IgM 03/22/2022 NON REACTIVE  NON REACTIVE Final   Hep B C IgM 03/22/2022 NON REACTIVE  NON REACTIVE Final   Performed at [facility], [place] [address] [place], [place] [address]   HCV Quantitative 03/23/2022 HCV Not Detected  >50 IU/mL Final   Test Information 03/23/2022 Comment   Final   Comment: (NOTE) The quantitative range of this assay is 15 IU/mL to 100 million IU/mL. Performed At: [facility] [place], [place] [id number] Rush Farmer MD IO:[id number]   Admission on 03/20/2022, Discharged on 03/21/2022  Component Date Value Ref Range Status   Sodium 03/20/2022 134 (L)  135 - 145 mmol/L Final   Potassium 03/20/2022 2.9 (L)  3.5 - 5.1 mmol/L Final   Chloride 03/20/2022 101  98 - 111 mmol/L Final   CO2 03/20/2022 22  22 - 32 mmol/L Final   Glucose, Bld 03/20/2022 126 (H)  70 - 99 mg/dL Final   Glucose reference range applies only to samples taken after fasting for at least 8 hours.   BUN 03/20/2022 10  6 - 20 mg/dL Final   Creatinine, Ser 03/20/2022 0.70  0.44 - 1.00 mg/dL Final   Calcium 03/20/2022 8.7 (L)  8.9 - 10.3 mg/dL Final   Total Protein 03/20/2022 7.6  6.5 - 8.1 g/dL Final   Albumin 03/20/2022 4.2  3.5 - 5.0 g/dL Final   AST 03/20/2022 65 (H)  15 - 41 U/L  Final   ALT 03/20/2022 22  0 - 44 U/L Final   Alkaline Phosphatase 03/20/2022 100  38 - 126 U/L Final   Total Bilirubin 03/20/2022 0.4  0.3 - 1.2 mg/dL Final   GFR, Estimated 03/20/2022 >60  >60 mL/min Final   Comment: (NOTE) Calculated using the CKD-EPI Creatinine Equation (2021)    Anion gap 03/20/2022 11  5 - 15 Final   Performed at [facility], [address], [place], [place] [address]   Alcohol, Ethyl (B) 03/20/2022 142 (H)  <10  mg/dL Final   Comment: (NOTE) Lowest detectable limit for serum alcohol is 10 mg/dL.  For medical purposes only. Performed at [facility], [address], [place], [place] [address]    WBC 03/20/2022 5.5  4.0 - 10.5 K/uL Final   RBC 03/20/2022 4.69  3.87 - 5.11 MIL/uL Final   Hemoglobin 03/20/2022 14.5  12.0 - 15.0 g/dL Final   HCT 03/20/2022 41.7  36.0 - 46.0 % Final   MCV 03/20/2022 88.9  80.0 - 100.0 fL Final   MCH 03/20/2022 30.9  26.0 - 34.0 pg Final   MCHC 03/20/2022 34.8  30.0 - 36.0 g/dL Final   RDW 03/20/2022 15.6 (H)  11.5 - 15.5 % Final   Platelets 03/20/2022 193  150 - 400 K/uL Final   nRBC 03/20/2022 0.0  0.0 - 0.2 % Final   Performed at [facility], [address], [place], [place] [address]   Opiates 03/20/2022 NONE DETECTED  NONE DETECTED Final   Cocaine 03/20/2022 NONE DETECTED  NONE DETECTED Final   Benzodiazepines 03/20/2022 NONE DETECTED  NONE DETECTED Final   Amphetamines 03/20/2022 NONE DETECTED  NONE DETECTED Final   Tetrahydrocannabinol 03/20/2022 NONE DETECTED  NONE DETECTED Final   Barbiturates 03/20/2022 NONE DETECTED  NONE DETECTED Final   Comment: (NOTE) DRUG SCREEN FOR MEDICAL PURPOSES ONLY.  IF CONFIRMATION IS NEEDED FOR ANY PURPOSE, NOTIFY LAB WITHIN 5 DAYS.  LOWEST DETECTABLE LIMITS FOR URINE DRUG SCREEN Drug Class                     Cutoff (ng/mL) Amphetamine and metabolites    1000 Barbiturate and metabolites    200 Benzodiazepine                 200 Opiates and metabolites        300 Cocaine and metabolites        300 THC                            50 Performed at [facility], [address] [place], [place] [address]    Preg Test, Ur 03/20/2022 Negative  Negative Final   SARS Coronavirus 2 by RT PCR 03/21/2022 NEGATIVE  NEGATIVE Final   Comment: (NOTE) SARS-CoV-2 target nucleic acids are NOT DETECTED.  The SARS-CoV-2 RNA is generally detectable in upper respiratory specimens during the acute phase of infection. The  lowest concentration of SARS-CoV-2 viral copies this assay can detect is 138 copies/mL. A negative result does not preclude SARS-Cov-2 infection and should not be used as the sole basis for treatment or other patient management decisions. A negative result may occur with  improper specimen collection/handling, submission of specimen other than nasopharyngeal swab, presence of viral mutation(s) within the areas targeted by this assay, and inadequate number of viral copies(<138 copies/mL). A negative result must be combined with clinical observations, patient history, and epidemiological information. The expected  result is Negative.  Fact Sheet for Patients:  EntrepreneurPulse.com.au  Fact Sheet for Healthcare Providers:  IncredibleEmployment.be  This test is no                          t yet approved or cleared by the [place] FDA and  has been authorized for detection and/or diagnosis of SARS-CoV-2 by FDA under an Emergency Use Authorization (EUA). This EUA will remain  in effect (meaning this test can be used) for the duration of the COVID-19 declaration under Section 564(b)(1) of the Act, 21 U.S.C.section 360bbb-3(b)(1), unless the authorization is terminated  or revoked sooner.       Influenza A by PCR 03/21/2022 NEGATIVE  NEGATIVE Final   Influenza B by PCR 03/21/2022 NEGATIVE  NEGATIVE Final   Comment: (NOTE) The Xpert Xpress SARS-CoV-2/FLU/RSV plus assay is intended as an aid in the diagnosis of influenza from Nasopharyngeal swab specimens and should not be used as a sole basis for treatment. Nasal washings and aspirates are unacceptable for Xpert Xpress SARS-CoV-2/FLU/RSV testing.  Fact Sheet for Patients: EntrepreneurPulse.com.au  Fact Sheet for Healthcare Providers: IncredibleEmployment.be  This test is not yet approved or cleared by the [place] FDA and has been authorized for  detection and/or diagnosis of SARS-CoV-2 by FDA under an Emergency Use Authorization (EUA). This EUA will remain in effect (meaning this test can be used) for the duration of the COVID-19 declaration under Section 564(b)(1) of the Act, 21 U.S.C. section 360bbb-3(b)(1), unless the authorization is terminated or revoked.     Resp Syncytial Virus by PCR 03/21/2022 NEGATIVE  NEGATIVE Final   Comment: (NOTE) Fact Sheet for Patients: EntrepreneurPulse.com.au  Fact Sheet for Healthcare Providers: IncredibleEmployment.be  This test is not yet approved or cleared by the [place] FDA and has been authorized for detection and/or diagnosis of SARS-CoV-2 by FDA under an Emergency Use Authorization (EUA). This EUA will remain in effect (meaning this test can be used) for the duration of the COVID-19 declaration under Section 564(b)(1) of the Act, 21 U.S.C. section 360bbb-3(b)(1), unless the authorization is terminated or revoked.  Performed at [facility], [address] [place], [place] [address]   Admission on 01/18/2022, Discharged on 01/18/2022  Component Date Value Ref Range Status   WBC 01/18/2022 5.9  4.0 - 10.5 K/uL Final   RBC 01/18/2022 4.87  3.87 - 5.11 MIL/uL Final   Hemoglobin 01/18/2022 14.5  12.0 - 15.0 g/dL Final   HCT 01/18/2022 42.3  36.0 - 46.0 % Final   MCV 01/18/2022 86.9  80.0 - 100.0 fL Final   MCH 01/18/2022 29.8  26.0 - 34.0 pg Final   MCHC 01/18/2022 34.3  30.0 - 36.0 g/dL Final   RDW 01/18/2022 17.6 (H)  11.5 - 15.5 % Final   Platelets 01/18/2022 348  150 - 400 K/uL Final   nRBC 01/18/2022 0.0  0.0 - 0.2 % Final   Performed at [facility], [address], [place], [place] [address]   Troponin I (High Sensitivity) 01/18/2022 3  <18 ng/L Final   Comment: (NOTE) Elevated high sensitivity troponin I (hsTnI) values and significant  changes across serial measurements may suggest ACS but many other  chronic and acute conditions  are known to elevate hsTnI results.  Refer to the "Links" section for chest pain algorithms and additional  guidance. Performed at [facility], [address] [place], [place] [address]    Sodium 01/18/2022 139  135 -  145 mmol/L Final   Potassium 01/18/2022 3.6  3.5 - 5.1 mmol/L Final   Chloride 01/18/2022 104  98 - 111 mmol/L Final   CO2 01/18/2022 17 (L)  22 - 32 mmol/L Final   Glucose, Bld 01/18/2022 72  70 - 99 mg/dL Final   Glucose reference range applies only to samples taken after fasting for at least 8 hours.   BUN 01/18/2022 11  6 - 20 mg/dL Final   Creatinine, Ser 01/18/2022 0.90  0.44 - 1.00 mg/dL Final   Calcium 01/18/2022 8.8 (L)  8.9 - 10.3 mg/dL Final   Total Protein 01/18/2022 7.5  6.5 - 8.1 g/dL Final   Albumin 01/18/2022 3.9  3.5 - 5.0 g/dL Final   AST 01/18/2022 35  15 - 41 U/L Final   ALT 01/18/2022 19  0 - 44 U/L Final   Alkaline Phosphatase 01/18/2022 86  38 - 126 U/L Final   Total Bilirubin 01/18/2022 0.4  0.3 - 1.2 mg/dL Final   GFR, Estimated 01/18/2022 >60  >60 mL/min Final   Comment: (NOTE) Calculated using the CKD-EPI Creatinine Equation (2021)    Anion gap 01/18/2022 18 (H)  5 - 15 Final   Performed at [facility], [address] [place], [place] [address]   Troponin I (High Sensitivity) 01/18/2022 4  <18 ng/L Final   Comment: (NOTE) Elevated high sensitivity troponin I (hsTnI) values and significant  changes across serial measurements may suggest ACS but many other  chronic and acute conditions are known to elevate hsTnI results.  Refer to the "Links" section for chest pain algorithms and additional  guidance. Performed at [facility], [address], [place], [place] [address]     Blood Alcohol level:  Lab Results  Component Value Date   ETH 142 (H) 03/20/2022   ETH 133 (H) 123XX123    Metabolic Disorder Labs: No results found for: "HGBA1C", "MPG" No results found for: "PROLACTIN" No results found for: "CHOL", "TRIG", "HDL", "CHOLHDL",  "VLDL", "LDLCALC"  Therapeutic Lab Levels: No results found for: "LITHIUM" No results found for: "VALPROATE" No results found for: "CBMZ"  Physical Findings   Mini-Mental    [place] Office Visit from 08/11/2021 in [facility]  Total Score (max 30 points ) 26      PHQ2-9    [place] ED from 03/21/2022 in [facility] ED from 06/04/2021 in [place]  PHQ-2 Total Score 2 2  PHQ-9 Total Score 3 4      [place] ED from 03/21/2022 in [facility] ED from 03/20/2022 in [place] ED from 03/14/2022 in [place] No Risk High Risk Moderate Risk        Musculoskeletal  Strength & Muscle Tone: within normal limits Gait & Station: normal Patient leans: N/A  Psychiatric Specialty Exam  Presentation  General Appearance:  Casual  Eye Contact: Fair  Speech: Clear and Coherent  Speech Volume: normal  Mood and Affect  Mood: "Good"  Affect: Appropriate; Congruent   Thought Process  Thought Processes: Coherent; Goal Directed  Descriptions of Associations:Intact  Orientation:Full (Time, Place and Person)  Thought Content:Logical; WDL  Diagnosis of Schizophrenia or Schizoaffective disorder in past: No    Hallucinations:none Ideas of Reference:None  Suicidal Thoughts: none Homicidal Thoughts: none  Sensorium  Memory: Immediate Fair; Recent Fair  Judgment: Fair  Insight: fair   Executive Functions  Concentration: Fair  Attention  Span: Fair  Recall: [employer] of Knowledge: Fair  Language: Fair   Psychomotor Activity  Psychomotor Activity: normal  Assets  Assets: Desire for Improvement; Resilience   Sleep  Sleep: fair  Physical Exam  Physical Exam Constitutional:      Appearance: the patient is not toxic-appearing.  Pulmonary:     Effort: Pulmonary effort is  normal.  Neurological:     General: No focal deficit present.     Mental Status: the patient is alert and oriented to person, place, and time.   Review of Systems  Respiratory:  Negative for shortness of breath.   Cardiovascular:  Negative for chest pain.  Gastrointestinal:  Negative for abdominal pain, constipation, diarrhea, nausea and vomiting.  Neurological:  Negative for headaches.   Blood pressure 105/81, pulse (!) 102, temperature 98.3 F (36.8 C), temperature source Tympanic, resp. rate 16, last menstrual period 03/13/2022, SpO2 97 %. There is no height or weight on file to calculate BMI.  Treatment Plan Summary: Daily contact with patient to assess and evaluate symptoms and progress in treatment and Medication management  Status: Voluntary  Alcohol use disorder with depression - Continue Celexa for mood - Continue gabapentin and Librium taper for alcohol withdrawal  Medical: - Repeat potassium was 4.0 mmol/L - Hepatitis Panel  Reactive to HCV Ab- Patient aware of this. HCV Not detected on HCB Quantitative.   Dispo: Follow-up with [facility], January 10th 9 AM  Christene Slates, MD PGY3 03/26/2022 9:37 AM

## 2022-03-26 NOTE — Progress Notes (Signed)
Hannah Rhodes remained OOB in the milieu most of the day eccept for a brief interval after lunch. She stated her headache went away after the tylenol this AM. She has been social with her peers and watching  TV. She attended the group therapy sessions today.

## 2022-03-26 NOTE — ED Notes (Signed)
Watching TV.  

## 2022-03-26 NOTE — ED Notes (Signed)
In group meeting with the Dr

## 2022-03-27 DIAGNOSIS — Z9152 Personal history of nonsuicidal self-harm: Secondary | ICD-10-CM | POA: Diagnosis not present

## 2022-03-27 DIAGNOSIS — Z79899 Other long term (current) drug therapy: Secondary | ICD-10-CM | POA: Diagnosis not present

## 2022-03-27 DIAGNOSIS — F17210 Nicotine dependence, cigarettes, uncomplicated: Secondary | ICD-10-CM | POA: Diagnosis not present

## 2022-03-27 DIAGNOSIS — F1094 Alcohol use, unspecified with alcohol-induced mood disorder: Secondary | ICD-10-CM | POA: Diagnosis not present

## 2022-03-27 MED ORDER — HYDROXYZINE HCL 25 MG PO TABS: 25.0000 mg | ORAL_TABLET | Freq: Three times a day (TID) | ORAL | 0 refills | Status: DC | PRN

## 2022-03-27 MED ORDER — NICOTINE 14 MG/24HR TD PT24: 14.0000 mg | MEDICATED_PATCH | Freq: Every day | TRANSDERMAL | 0 refills | Status: AC

## 2022-03-27 MED ORDER — GABAPENTIN 100 MG PO CAPS: 100.0000 mg | ORAL_CAPSULE | Freq: Three times a day (TID) | ORAL | 0 refills | Status: AC

## 2022-03-27 MED ORDER — CITALOPRAM HYDROBROMIDE 20 MG PO TABS: 20.0000 mg | ORAL_TABLET | Freq: Every day | ORAL | 0 refills | Status: AC

## 2022-03-27 MED ORDER — TRAZODONE HCL 50 MG PO TABS: 50.0000 mg | ORAL_TABLET | Freq: Every evening | ORAL | 0 refills | Status: AC | PRN

## 2022-03-27 NOTE — ED Notes (Signed)
Pt sleeping in no acute distress. RR even and unlabored. Environment secured. Will continue to monitor for safety. 

## 2022-03-27 NOTE — ED Notes (Signed)
Patient A&Ox4. Denies intent to harm self/others when asked. Denies A/VH. Patient denies any physical complaints when asked. No acute distress noted. Routine safety checks conducted according to facility protocol. Encouraged patient to notify staff if thoughts of harm toward self or others arise. Patient verbalize understanding and agreement. Will continue to monitor for safety.    

## 2022-03-27 NOTE — ED Notes (Signed)
Pt asleep in bed. Respirations even and unlabored. Monitoring for safety. 

## 2022-03-27 NOTE — ED Notes (Signed)
Notified pt that breakfast is ready 

## 2022-03-27 NOTE — Discharge Instructions (Signed)

## 2022-03-27 NOTE — ED Provider Notes (Signed)
FBC/OBS ASAP Discharge Summary  Date and Time: 03/27/2022 8:24 AM  Name: Hannah Rhodes  MRN:  [id number]   Discharge Diagnoses:  Final diagnoses:  Alcohol-induced mood disorder (HCC)   Subjective:  Hannah Rhodes is a 42 year old female with a past psychiatric history significant for alcohol use disorder presenting to [facility] for complaint of worsening anxiety, depression, and alcohol use, and admitted to [facility] for help regarding her alcohol use disorder. She is scheduled for an appointment with [facility] in [place] for January 10, where she will have access to intensive outpatient resources following the appointment   Stay Summary:  During the patient's hospitalization, patient had extensive initial psychiatric evaluation, and follow-up psychiatric evaluations every day.  Psychiatric diagnoses provided upon initial assessment:  Alcohol induced mood disorder  Patient's psychiatric medications were adjusted on admission:  Celexa 20 mg daily for depression Librium taper (03/22/2022-03/25/2022) for alcohol withdrawal Ativan taper (03/21/2022-03/24/2022) for alcohol withdrawal Gabapentin 100 mg TID for alcohol withdrawal  Patient's care was discussed during the interdisciplinary team meeting every day during the hospitalization.  The patient denies having side effects to prescribed psychiatric medication.  Gradually, patient started adjusting to milieu. The patient was evaluated each day by a clinical provider to ascertain response to treatment. Improvement was noted by the patient's report of decreasing symptoms, improved sleep and appetite, affect, medication tolerance, behavior, and participation in unit programming.  Patient was asked each day to complete a self inventory noting mood, mental status, pain, new symptoms, anxiety and concerns.    Symptoms were reported as significantly decreased or resolved completely by discharge.   On day of discharge, the patient reports that their mood is  stable. The patient denied having suicidal thoughts for more than 48 hours prior to discharge.  Patient denies having homicidal thoughts.  Patient denies having auditory hallucinations.  Patient denies any visual hallucinations or other symptoms of psychosis. The patient was motivated to continue taking medication with a goal of continued improvement in mental health.   The patient reports their target psychiatric symptoms of depression, cravings and withdrawals all responded well to the psychiatric medications, and the patient reports overall benefit other psychiatric hospitalization. Supportive psychotherapy was provided to the patient. The patient also participated in regular group therapy while hospitalized. Coping skills, problem solving as well as relaxation therapies were also part of the unit programming.  Labs were reviewed with the patient, and abnormal results were discussed with the patient.  The patient is able to verbalize their individual safety plan to this provider.   Total Time spent with patient: 15 minutes  Past Psychiatric History: As detailed above. Past Medical History:  Past Medical History:  Diagnosis Date   Alcoholism (HCC)    Anxiety    Chronic hepatitis C (HCC)    Coma (HCC)    while pregnant   COPD (chronic obstructive pulmonary disease) (HCC)    no inaler   DDD (degenerative disc disease), lumbar    Fatty liver    Generalized headaches    GERD (gastroesophageal reflux disease)    diet controlled, no med   Hepatitis C    Heroin use    HPV (human papilloma virus) infection    Hypertension    Insomnia    Migraine    Panic attack    Pneumonia 03/2010   Smoker    Tachycardia     Past Surgical History:  Procedure Laterality Date   BIOPSY  09/28/2019   Procedure: BIOPSY;  Surgeon: Lemar Lofty.,  MD;  Location: [facility] ENDOSCOPY;  Service: Gastroenterology;;   CESAREAN SECTION     CESAREAN SECTION     ESOPHAGOGASTRODUODENOSCOPY (EGD) WITH PROPOFOL  N/A 09/28/2019   Procedure: ESOPHAGOGASTRODUODENOSCOPY (EGD) WITH PROPOFOL;  Surgeon: Meridee Score Netty Starring., MD;  Location: [facility] ENDOSCOPY;  Service: Gastroenterology;  Laterality: N/A;   EUS  09/28/2019   Procedure: UPPER ENDOSCOPIC ULTRASOUND (EUS) LINEAR;  Surgeon: Lemar Lofty., MD;  Location: [facility] ENDOSCOPY;  Service: Gastroenterology;;   EUS  09/28/2019   Procedure: FULL UPPER ENDOSCOPIC ULTRASOUND (EUS) RADIAL;  Surgeon: Lemar Lofty., MD;  Location: [facility] ENDOSCOPY;  Service: Gastroenterology;;   FINE NEEDLE ASPIRATION  09/28/2019   Procedure: FINE NEEDLE ASPIRATION (FNA) LINEAR;  Surgeon: Lemar Lofty., MD;  Location: [facility] ENDOSCOPY;  Service: Gastroenterology;;   SMALL INTESTINE SURGERY     WISDOM TOOTH EXTRACTION     Family History:  Family History  Problem Relation Age of Onset   Heart failure Mother    CAD Mother    Heart attack Mother    Anxiety disorder Sister    Restless legs syndrome Sister    Dementia Maternal Grandmother    Dementia Maternal Grandfather    Colon cancer Neg Hx    Esophageal cancer Neg Hx    Inflammatory bowel disease Neg Hx    Liver disease Neg Hx    Pancreatic cancer Neg Hx    Stomach cancer Neg Hx    Rectal cancer Neg Hx    Family Psychiatric History: None pertinent Social History:  Social History   Substance and Sexual Activity  Alcohol Use Yes   Comment: daily , beer, liquor and wine     Social History   Substance and Sexual Activity  Drug Use Not Currently   Types: Marijuana, Heroin, Oxycodone, Hydrocodone, Cocaine   Comment: nothing at this time (last month was last use 07/01/21)    Social History   Socioeconomic History   Marital status: Single    Spouse name: Not on file   Number of children: 7   Years of education: Not on file   Highest education level: Not on file  Occupational History   Occupation: Homemaker  Tobacco Use   Smoking status: Every Day    Packs/day: 0.50    Years: 24.00    Total pack  years: 12.00    Types: Cigarettes   Smokeless tobacco: Never   Tobacco comments:    Cut back to 5 cig/day  Vaping Use   Vaping Use: Never used  Substance and Sexual Activity   Alcohol use: Yes    Comment: daily , beer, liquor and wine   Drug use: Not Currently    Types: Marijuana, Heroin, Oxycodone, Hydrocodone, Cocaine    Comment: nothing at this time (last month was last use 07/01/21)   Sexual activity: Not Currently  Other Topics Concern   Not on file  Social History Narrative   Not on file   Social Determinants of Health   Financial Resource Strain: Not on file  Food Insecurity: Not on file  Transportation Needs: Not on file  Physical Activity: Not on file  Stress: Not on file  Social Connections: Not on file   SDOH:  SDOH Screenings   Depression (PHQ2-9): Low Risk  (03/23/2022)  Recent Concern: Depression (PHQ2-9) - High Risk (03/21/2022)  Tobacco Use: High Risk (03/21/2022)    Tobacco Cessation:  A prescription for an FDA-approved tobacco cessation medication provided at discharge  Current Medications:  Current Facility-Administered Medications  Medication Dose Route Frequency Provider Last Rate Last Admin   acetaminophen (TYLENOL) tablet 650 mg  650 mg Oral Q6H PRN Lenard Lance, FNP   650 mg at 03/26/22 0944   alum & mag hydroxide-simeth (MAALOX/MYLANTA) 200-200-20 MG/5ML suspension 30 mL  30 mL Oral Q4H PRN Lenard Lance, FNP       citalopram (CELEXA) tablet 20 mg  20 mg Oral Daily Lamar Sprinkles, MD   20 mg at 03/26/22 0944   gabapentin (NEURONTIN) capsule 100 mg  100 mg Oral TID Lamar Sprinkles, MD   100 mg at 03/26/22 2058   hydrocortisone (ANUSOL-HC) 2.5 % rectal cream   Rectal BID Onuoha, Chinwendu V, NP   Given at 03/22/22 2105   hydrOXYzine (ATARAX) tablet 25 mg  25 mg Oral TID PRN Lenard Lance, FNP   25 mg at 03/23/22 2114   magnesium hydroxide (MILK OF MAGNESIA) suspension 30 mL  30 mL Oral Daily PRN Lenard Lance, FNP       multivitamin with minerals  tablet 1 tablet  1 tablet Oral Daily Lenard Lance, FNP   1 tablet at 03/26/22 0944   nicotine (NICODERM CQ - dosed in mg/24 hours) patch 14 mg  14 mg Transdermal Daily Lenard Lance, FNP   14 mg at 03/26/22 0947   thiamine (VITAMIN B1) tablet 100 mg  100 mg Oral Daily Lenard Lance, FNP   100 mg at 03/26/22 0944   traZODone (DESYREL) tablet 50 mg  50 mg Oral QHS PRN Lenard Lance, FNP   50 mg at 03/26/22 2058   Current Outpatient Medications  Medication Sig Dispense Refill   albuterol (VENTOLIN HFA) 108 (90 Base) MCG/ACT inhaler Inhale 1-2 puffs into the lungs every 6 (six) hours as needed for wheezing or shortness of breath. 6.7 g 0   citalopram (CELEXA) 20 MG tablet Take 1 tablet (20 mg total) by mouth daily. 30 tablet 0   gabapentin (NEURONTIN) 100 MG capsule Take 1 capsule (100 mg total) by mouth 3 (three) times daily. 30 capsule 0   hydrOXYzine (ATARAX) 25 MG tablet Take by mouth.     hydrOXYzine (ATARAX) 25 MG tablet Take 1 tablet (25 mg total) by mouth 3 (three) times daily as needed for anxiety. 30 tablet 0   nicotine (NICODERM CQ - DOSED IN MG/24 HOURS) 14 mg/24hr patch Place 1 patch (14 mg total) onto the skin daily. 28 patch 0   traZODone (DESYREL) 50 MG tablet Take 1 tablet (50 mg total) by mouth at bedtime as needed for sleep. 30 tablet 0    PTA Medications: (Not in a hospital admission)      03/23/2022    2:21 PM 03/21/2022    1:24 PM 06/05/2021    6:24 AM  Depression screen PHQ 2/9  Decreased Interest 1 2 1   Down, Depressed, Hopeless 1 2 1   PHQ - 2 Score 2 4 2   Altered sleeping 0 3 0  Tired, decreased energy 0 1 1  Change in appetite 1 3 0  Feeling bad or failure about yourself  0 2 0  Trouble concentrating 0 2 1  Moving slowly or fidgety/restless 0 1 0  Suicidal thoughts 0 2 0  PHQ-9 Score 3 18 4   Difficult doing work/chores Somewhat difficult Very difficult Somewhat difficult    Flowsheet Row ED from 03/21/2022 in [facility] from  03/20/2022 in [place] EMERGENCY DEPARTMENT ED from 03/14/2022 in [place]  EMERGENCY DEPARTMENT  C-SSRS RISK CATEGORY No Risk High Risk Moderate Risk       Musculoskeletal  Strength & Muscle Tone: within normal limits Gait & Station: normal Patient leans: N/A  Psychiatric Specialty Exam  Presentation  General Appearance:  Casual   Eye Contact: Fair   Speech: Clear and Coherent   Speech Volume: normal   Mood and Affect  Mood: "Ok"   Affect: Appropriate; Congruent     Thought Process  Thought Processes: Coherent; Goal Directed   Descriptions of Associations:Intact   Orientation:Full (Time, Place and Person)   Thought Content:Logical; WDL  Diagnosis of Schizophrenia or Schizoaffective disorder in past: No    Hallucinations:none Ideas of Reference:None   Suicidal Thoughts: none Homicidal Thoughts: none   Sensorium  Memory: Immediate Fair; Recent Fair   Judgment: Fair   Insight: fair     Executive Functions  Concentration: Fair   Attention Span: Fair   Recall: [employer] of Knowledge: Fair   Language: Fair     Psychomotor Activity  Psychomotor Activity: normal   Assets  Assets: Desire for Improvement; Resilience     Sleep  Sleep: fair   Physical Exam  Physical Exam Constitutional:      Appearance: the patient is not toxic-appearing.  Pulmonary:     Effort: Pulmonary effort is normal.  Neurological:     General: No focal deficit present.     Mental Status: the patient is alert and oriented to person, place, and time.    Review of Systems  Respiratory:  Negative for shortness of breath.   Cardiovascular:  Negative for chest pain.  Gastrointestinal:  Negative for abdominal pain, constipation, diarrhea, nausea and vomiting.  Neurological:  Negative for headaches.   Blood pressure 95/69, pulse 88, temperature 98.6 F (37 C), temperature source Oral, resp. rate 18, last menstrual period 03/13/2022, SpO2 97 %. There is  no height or weight on file to calculate BMI.  Demographic Factors:  Low socioeconomic status  Loss Factors: Financial problems/change in socioeconomic status  Historical Factors: NA  Risk Reduction Factors:   Responsible for children under 8 years of age  Continued Clinical Symptoms:  Alcohol/Substance Abuse/Dependencies  Cognitive Features That Contribute To Risk:  None    Suicide Risk:  Mild:  Suicidal ideation of limited frequency, intensity, duration, and specificity.  There are no identifiable plans, no associated intent, mild dysphoria and related symptoms, good self-control (both objective and subjective assessment), few other risk factors, and identifiable protective factors, including available and accessible social support.  Plan Of Care/Follow-up recommendations:    Disposition:  Patient discharged to home/self care.  Condition at discharge: Stable   Christene Slates, MD 03/27/2022, 8:24 AM

## 2022-03-27 NOTE — ED Notes (Signed)
Patient A&O x 4, ambulatory. Patient discharged in no acute distress. Patient denied SI/HI, A/VH upon discharge. Patient verbalized understanding of all discharge instructions explained by staff, to include follow up appointments, RX's and safety plan. Pt belongings returned to patient from locker #3  intact. Patient escorted to lobby via staff for transport to destination. Safety maintained.   

## 2022-04-14 ENCOUNTER — Encounter (HOSPITAL_COMMUNITY): Payer: Self-pay | Admitting: Emergency Medicine

## 2022-04-14 ENCOUNTER — Emergency Department (HOSPITAL_COMMUNITY)
Admission: EM | Admit: 2022-04-14 | Discharge: 2022-04-14 | Disposition: A | Discharge status: Home / Self Care | Payer: Medicaid Other | Attending: Emergency Medicine | Admitting: Emergency Medicine

## 2022-04-14 ENCOUNTER — Other Ambulatory Visit: Payer: Self-pay

## 2022-04-14 DIAGNOSIS — Z1152 Encounter for screening for COVID-19: Secondary | ICD-10-CM | POA: Diagnosis not present

## 2022-04-14 DIAGNOSIS — F1020 Alcohol dependence, uncomplicated: Secondary | ICD-10-CM | POA: Insufficient documentation

## 2022-04-14 DIAGNOSIS — J449 Chronic obstructive pulmonary disease, unspecified: Secondary | ICD-10-CM | POA: Diagnosis not present

## 2022-04-14 DIAGNOSIS — R112 Nausea with vomiting, unspecified: Secondary | ICD-10-CM | POA: Diagnosis present

## 2022-04-14 DIAGNOSIS — R739 Hyperglycemia, unspecified: Secondary | ICD-10-CM | POA: Diagnosis not present

## 2022-04-14 DIAGNOSIS — I10 Essential (primary) hypertension: Secondary | ICD-10-CM | POA: Insufficient documentation

## 2022-04-14 DIAGNOSIS — Z87891 Personal history of nicotine dependence: Secondary | ICD-10-CM | POA: Insufficient documentation

## 2022-04-14 DIAGNOSIS — F1090 Alcohol use, unspecified, uncomplicated: Secondary | ICD-10-CM

## 2022-04-14 LAB — CBC
HCT: 36 % (ref 36.0–46.0)
Hemoglobin: 12.4 g/dL (ref 12.0–15.0)
MCH: 29.9 pg (ref 26.0–34.0)
MCHC: 34.4 g/dL (ref 30.0–36.0)
MCV: 86.7 fL (ref 80.0–100.0)
Platelets: 273 10*3/uL (ref 150–400)
RBC: 4.15 MIL/uL (ref 3.87–5.11)
RDW: 14.9 % (ref 11.5–15.5)
WBC: 8.9 10*3/uL (ref 4.0–10.5)
nRBC: 0 % (ref 0.0–0.2)

## 2022-04-14 LAB — COMPREHENSIVE METABOLIC PANEL
ALT: 13 U/L (ref 0–44)
AST: 26 U/L (ref 15–41)
Albumin: 4.2 g/dL (ref 3.5–5.0)
Alkaline Phosphatase: 73 U/L (ref 38–126)
Anion gap: 16 — ABNORMAL HIGH (ref 5–15)
BUN: 5 mg/dL — ABNORMAL LOW (ref 6–20)
CO2: 25 mmol/L (ref 22–32)
Calcium: 9 mg/dL (ref 8.9–10.3)
Chloride: 100 mmol/L (ref 98–111)
Creatinine, Ser: 1.18 mg/dL — ABNORMAL HIGH (ref 0.44–1.00)
GFR, Estimated: 59 mL/min — ABNORMAL LOW (ref >60)
Glucose, Bld: 176 mg/dL — ABNORMAL HIGH (ref 70–99)
Potassium: 3.4 mmol/L — ABNORMAL LOW (ref 3.5–5.1)
Sodium: 141 mmol/L (ref 135–145)
Total Bilirubin: 0.5 mg/dL (ref 0.3–1.2)
Total Protein: 7.7 g/dL (ref 6.5–8.1)

## 2022-04-14 LAB — ETHANOL: Alcohol, Ethyl (B): <10 mg/dL (ref <10)

## 2022-04-14 LAB — PROTIME-INR
INR: 0.9 (ref 0.8–1.2)
Prothrombin Time: 12.4 seconds (ref 11.4–15.2)

## 2022-04-14 LAB — RESP PANEL BY RT-PCR (RSV, FLU A&B, COVID)  RVPGX2
Influenza A by PCR: NEGATIVE
Influenza B by PCR: NEGATIVE
Resp Syncytial Virus by PCR: NEGATIVE
SARS Coronavirus 2 by RT PCR: NEGATIVE

## 2022-04-14 LAB — POC OCCULT BLOOD, ED: Fecal Occult Bld: NEGATIVE

## 2022-04-14 LAB — HCG, QUANTITATIVE, PREGNANCY: hCG, Beta Chain, Quant, S: <1 m[IU]/mL (ref <5)

## 2022-04-14 MED ORDER — DIAZEPAM 5 MG/ML IJ SOLN
2.5000 mg | Freq: Once | INTRAMUSCULAR | Status: AC
  Administered 2022-04-14: 2.5 mg via INTRAVENOUS
  Filled 2022-04-14: qty 2

## 2022-04-14 MED ORDER — SODIUM CHLORIDE 0.9 % IV BOLUS
1000.0000 mL | Freq: Once | INTRAVENOUS | Status: AC
  Administered 2022-04-14: 1000 mL via INTRAVENOUS

## 2022-04-14 MED ORDER — ALUM & MAG HYDROXIDE-SIMETH 200-200-20 MG/5ML PO SUSP
30.0000 mL | Freq: Once | ORAL | Status: AC
  Administered 2022-04-14: 30 mL via ORAL
  Filled 2022-04-14: qty 30

## 2022-04-14 MED ORDER — PANTOPRAZOLE SODIUM 40 MG PO TBEC: 40.0000 mg | DELAYED_RELEASE_TABLET | Freq: Every day | ORAL | 1 refills | Status: AC

## 2022-04-14 MED ORDER — ONDANSETRON 4 MG PO TBDP: 4.0000 mg | ORAL_TABLET | Freq: Three times a day (TID) | ORAL | 0 refills | Status: AC | PRN

## 2022-04-14 MED ORDER — PANTOPRAZOLE SODIUM 40 MG IV SOLR
40.0000 mg | Freq: Once | INTRAVENOUS | Status: AC
  Administered 2022-04-14: 40 mg via INTRAVENOUS
  Filled 2022-04-14: qty 10

## 2022-04-14 MED ORDER — ONDANSETRON HCL 4 MG/2ML IJ SOLN
4.0000 mg | Freq: Once | INTRAMUSCULAR | Status: AC
  Administered 2022-04-14: 4 mg via INTRAVENOUS
  Filled 2022-04-14: qty 2

## 2022-04-14 NOTE — ED Notes (Signed)
Pt resting quietly in bed with eyes closed. Respirations even and unlabored. Denies needs at this time. Family member at bedside. No S/S of distress noted.

## 2022-04-14 NOTE — ED Provider Notes (Signed)
[place] EMERGENCY DEPARTMENT AT [facility] Provider Note   CSN: [id number] Arrival date & time: 04/14/22  9528     History  Chief Complaint  Patient presents with   Chest Pain   Hematemesis    Hannah Rhodes is a 42 y.o. female.  The history is provided by the patient.  Patient with history of COPD, alcohol use disorder presents with chest pain and vomiting.  Patient reports she started vomiting earlier in the night around 1 AM.  She reports multiple episodes of hematemesis.  She reports she started having chest pain with vomiting.  No change in bowel movements, no bloody or black stools Patient also admits to relapsing with alcohol use No new abdominal pain.     Past Medical History:  Diagnosis Date   Alcoholism (HCC)    Anxiety    Chronic hepatitis C (HCC)    Coma (HCC)    while pregnant   COPD (chronic obstructive pulmonary disease) (HCC)    no inaler   DDD (degenerative disc disease), lumbar    Fatty liver    Generalized headaches    GERD (gastroesophageal reflux disease)    diet controlled, no med   Hepatitis C    Heroin use    HPV (human papilloma virus) infection    Hypertension    Insomnia    Migraine    Panic attack    Pneumonia 03/2010   Smoker    Tachycardia     Home Medications Prior to Admission medications   Medication Sig Start Date End Date Taking? Authorizing Provider  albuterol (VENTOLIN HFA) 108 (90 Base) MCG/ACT inhaler Inhale 1-2 puffs into the lungs every 6 (six) hours as needed for wheezing or shortness of breath. 09/12/20   Pricilla Riffle, MD  citalopram (CELEXA) 20 MG tablet Take 1 tablet (20 mg total) by mouth daily. 03/27/22   Carrion-Carrero, Karle Starch, MD  gabapentin (NEURONTIN) 100 MG capsule Take 1 capsule (100 mg total) by mouth 3 (three) times daily. 03/27/22   Carrion-Carrero, Karle Starch, MD  hydrOXYzine (ATARAX) 25 MG tablet Take by mouth. 03/16/22   [provider]  hydrOXYzine (ATARAX) 25 MG tablet Take 1 tablet  (25 mg total) by mouth 3 (three) times daily as needed for anxiety. 03/27/22   Carrion-Carrero, Karle Starch, MD  nicotine (NICODERM CQ - DOSED IN MG/24 HOURS) 14 mg/24hr patch Place 1 patch (14 mg total) onto the skin daily. 03/27/22   Carrion-Carrero, Karle Starch, MD  traZODone (DESYREL) 50 MG tablet Take 1 tablet (50 mg total) by mouth at bedtime as needed for sleep. 03/27/22   Carrion-Carrero, Karle Starch, MD      Allergies    Wellbutrin [bupropion] and Adhesive [tape]    Review of Systems   Review of Systems  Constitutional:  Negative for fever.  Cardiovascular:  Positive for chest pain.  Gastrointestinal:  Positive for vomiting. Negative for blood in stool.    Physical Exam Updated Vital Signs BP 114/72   Pulse 80   Temp 98.8 F (37.1 C) (Oral)   Resp 12   Ht 1.473 m (4\' 10" )   Wt 43.1 kg   LMP 04/09/2022 (Exact Date)   SpO2 100%   BMI 19.86 kg/m  Physical Exam CONSTITUTIONAL: Chronically ill-appearing, appears older than stated age, appears disheveled and smells of ketones HEAD: Normocephalic/atraumatic EYES: EOMI/PERRL, no icterus ENMT: Mucous membranes dry, no blood in mouth NECK: supple no meningeal signs CV: S1/S2 noted, tachycardic LUNGS: Lungs are clear to auscultation bilaterally, no apparent  distress Chest-  tenderness to palpation, no crepitus ABDOMEN: soft, nontender Rectal-no blood or melena, Hemoccult negative, female nurse tech chaperone present for exam NEURO: Pt is awake/alert/appropriate, moves all extremitiesx4.  No facial droop.   EXTREMITIES: pulses normal/equal, full ROM SKIN: warm, color normal PSYCH: Flat affect  ED Results / Procedures / Treatments   Labs (all labs ordered are listed, but only abnormal results are displayed) Labs Reviewed  COMPREHENSIVE METABOLIC PANEL - Abnormal; Notable for the following components:      Result Value   Potassium 3.4 (*)    Glucose, Bld 176 (*)    BUN 5 (*)    Creatinine, Ser 1.18 (*)    GFR, Estimated 59 (*)     Anion gap 16 (*)    All other components within normal limits  RESP PANEL BY RT-PCR (RSV, FLU A&B, COVID)  RVPGX2  CBC  PROTIME-INR  ETHANOL  HCG, QUANTITATIVE, PREGNANCY  POC OCCULT BLOOD, ED    EKG EKG Interpretation  Date/Time:  Saturday April 14 2022 04:47:20 EST Ventricular Rate:  99 PR Interval:  115 QRS Duration: 77 QT Interval:  369 QTC Calculation: 474 R Axis:   21 Text Interpretation: Sinus rhythm Baseline wander in lead(s) V4 Interpretation limited secondary to artifact Confirmed by Ripley Fraise [phone]) on 04/14/2022 4:51:12 AM  Radiology No results found.  Procedures Procedures    Medications Ordered in ED Medications  sodium chloride 0.9 % bolus 1,000 mL (0 mLs Intravenous Stopped 04/14/22 0607)  ondansetron (ZOFRAN) injection 4 mg (4 mg Intravenous Given 04/14/22 0511)  pantoprazole (PROTONIX) injection 40 mg (40 mg Intravenous Given 04/14/22 0510)  sodium chloride 0.9 % bolus 1,000 mL (1,000 mLs Intravenous New Bag/Given 04/14/22 0610)  alum & mag hydroxide-simeth (MAALOX/MYLANTA) 200-200-20 MG/5ML suspension 30 mL (30 mLs Oral Given 04/14/22 0609)  diazepam (VALIUM) injection 2.5 mg (2.5 mg Intravenous Given 04/14/22 0648)  ondansetron (ZOFRAN) injection 4 mg (4 mg Intravenous Given 04/14/22 0093)    ED Course/ Medical Decision Making/ A&P Clinical Course as of 04/14/22 0654  Sat Apr 14, 2022  0523 Patient with recent multiple visits to outside hospital for chest pain, headache and also for her alcohol use disorder.  Patient appears dehydrated.  IV fluids been ordered.  Previous endoscopy has revealed esophagitis, but no signs of varices [DW]  0524 Glucose(!): 176 Hyperglycemia [DW]  0646 Labs overall unremarkable.  However patient still reporting nausea and anxiety.  She reports she recently was sober after rehab stent, but recently relapsed and drank some beer and liquor yesterday.  She adamantly denies any other drug abuse. [DW]  [phone] Plan will be to  give Zofran, benzodiazepine for her anxiety and reassess.  If no more vomiting, patient can be safe for discharge [DW]  0651 At signout to Dr. Sabra Heck, reassess patient after medications.  If no further vomiting she can be discharged home [DW]    Clinical Course User Index [DW] Ripley Fraise, MD                             Medical Decision Making Amount and/or Complexity of Data Reviewed Labs: ordered. Decision-making details documented in ED Course.  Risk OTC drugs. Prescription drug management.   This patient presents to the ED for concern of vomiting and chest pain, this involves an extensive number of treatment options, and is a complaint that carries with it a high risk of complications and morbidity.  The differential diagnosis  includes but is not limited to acute coronary syndrome, aortic dissection, pulmonary embolism, pericarditis, pneumothorax, pneumonia, myocarditis, pleurisy, esophageal rupture, peptic ulcer disease, esophageal varices, bowel obstruction    Comorbidities that complicate the patient evaluation: Patient's presentation is complicated by their history of alcohol use disorder  Social Determinants of Health: Patient's  alcohol use and multiple ER visits   increases the complexity of managing their presentation  Additional history obtained: Additional history obtained from family Records reviewed Care Everywhere/External Records  Lab Tests: I Ordered, and personally interpreted labs.  The pertinent results include: hyperglycemia   Cardiac Monitoring: The patient was maintained on a cardiac monitor.  I personally viewed and interpreted the cardiac monitor which showed an underlying rhythm of:  sinus tachycardia  Medicines ordered and prescription drug management: I ordered medication including Zofran and Protonix for vomiting  Reevaluation of the patient after these medicines showed that the patient    stayed the same  Critical Interventions:  IV  fluids   Reevaluation: After the interventions noted above, I reevaluated the patient and found that they have :stayed the same  Complexity of problems addressed: Patient's presentation is most consistent with  acute presentation with potential threat to life or bodily function           Final Clinical Impression(s) / ED Diagnoses Final diagnoses:  Alcohol use disorder  Nausea and vomiting, unspecified vomiting type    Rx / DC Orders ED Discharge Orders     None         Ripley Fraise, MD 04/14/22 [phone]

## 2022-04-14 NOTE — ED Notes (Signed)
ED Provider at bedside. 

## 2022-04-14 NOTE — ED Notes (Signed)
Pt provided PO Fluids per MD request. Pt tolerating sips of fluids but reports her throat hurts when she swallows them. EDP notified.

## 2022-04-14 NOTE — ED Provider Notes (Signed)
Patient accepted at change of shift, she has had no vomiting while in the emergency department, specifically no hematemesis, she has received IV fluids, has gotten antiemetics and has done well.  There is no signs of significant anemia, endoscopy in the past showing no signs of varices, she is not tachycardic hypotensive or febrile.  She is well-appearing for discharge, will start PPI in addition to antiemetics for home   Noemi Chapel, MD 04/14/22 [phone]

## 2022-04-14 NOTE — Discharge Instructions (Addendum)
Your testing has been reassuring, you may take Zofran every 8 hours as needed for nausea or vomiting, drink plenty of clear liquids, try to avoid alcohol at all costs, I would also recommend that you start taking a medication for stomach acid, pantoprazole once a day, make sure that you take it at the same time every day.  Thank you for allowing [place] to treat you in the emergency department today.  After reviewing your examination and potential testing that was done it appears that you are safe to go home.  I would like for you to follow-up with your doctor within the next several days, have them obtain your results and follow-up with them to review all of these tests.  If you should develop severe or worsening symptoms return to the emergency department immediately

## 2022-04-14 NOTE — ED Notes (Signed)
AVS with prescriptions provided to and discussed with patient and family member at bedside. Pt verbalizes understanding of discharge instructions and denies any questions or concerns at this time. Pt has ride home. Pt taken out of department via W/C.  ? ?

## 2022-04-14 NOTE — ED Triage Notes (Addendum)
Pt states she has been vomiting blood since returning from [facility] last night (for migraines) around 1am. Describes emesis as brownish in color. States chest pain started when she started vomiting blood. Pt endorses ETOH use. States she "had stopped for thirteen days and started back yesterday".

## 2022-07-25 DIAGNOSIS — R69 Illness, unspecified: Secondary | ICD-10-CM | POA: Diagnosis not present

## 2022-12-19 DIAGNOSIS — I10 Essential (primary) hypertension: Secondary | ICD-10-CM | POA: Diagnosis not present

## 2022-12-19 DIAGNOSIS — R110 Nausea: Secondary | ICD-10-CM | POA: Diagnosis not present

## 2022-12-19 DIAGNOSIS — K2900 Acute gastritis without bleeding: Secondary | ICD-10-CM | POA: Diagnosis not present

## 2022-12-19 DIAGNOSIS — R1013 Epigastric pain: Secondary | ICD-10-CM | POA: Diagnosis not present

## 2022-12-19 DIAGNOSIS — F1090 Alcohol use, unspecified, uncomplicated: Secondary | ICD-10-CM | POA: Diagnosis not present

## 2022-12-19 DIAGNOSIS — R0789 Other chest pain: Secondary | ICD-10-CM | POA: Diagnosis not present

## 2022-12-19 DIAGNOSIS — F17210 Nicotine dependence, cigarettes, uncomplicated: Secondary | ICD-10-CM | POA: Diagnosis not present

## 2022-12-19 DIAGNOSIS — R9431 Abnormal electrocardiogram [ECG] [EKG]: Secondary | ICD-10-CM | POA: Diagnosis not present

## 2022-12-19 DIAGNOSIS — K219 Gastro-esophageal reflux disease without esophagitis: Secondary | ICD-10-CM | POA: Diagnosis not present

## 2023-10-05 ENCOUNTER — Emergency Department (HOSPITAL_COMMUNITY): Payer: MEDICAID

## 2023-10-05 ENCOUNTER — Emergency Department (HOSPITAL_COMMUNITY)
Admission: EM | Admit: 2023-10-05 | Discharge: 2023-10-05 | Disposition: A | Discharge status: Home / Self Care | Payer: MEDICAID | Attending: Emergency Medicine | Admitting: Emergency Medicine

## 2023-10-05 DIAGNOSIS — R42 Dizziness and giddiness: Secondary | ICD-10-CM | POA: Diagnosis present

## 2023-10-05 DIAGNOSIS — Y906 Blood alcohol level of 120-199 mg/100 ml: Secondary | ICD-10-CM | POA: Diagnosis not present

## 2023-10-05 DIAGNOSIS — K760 Fatty (change of) liver, not elsewhere classified: Secondary | ICD-10-CM | POA: Insufficient documentation

## 2023-10-05 DIAGNOSIS — E162 Hypoglycemia, unspecified: Secondary | ICD-10-CM

## 2023-10-05 DIAGNOSIS — I10 Essential (primary) hypertension: Secondary | ICD-10-CM | POA: Diagnosis not present

## 2023-10-05 DIAGNOSIS — R945 Abnormal results of liver function studies: Secondary | ICD-10-CM | POA: Diagnosis not present

## 2023-10-05 DIAGNOSIS — R079 Chest pain, unspecified: Secondary | ICD-10-CM | POA: Diagnosis not present

## 2023-10-05 DIAGNOSIS — Z79899 Other long term (current) drug therapy: Secondary | ICD-10-CM | POA: Insufficient documentation

## 2023-10-05 DIAGNOSIS — R001 Bradycardia, unspecified: Secondary | ICD-10-CM | POA: Insufficient documentation

## 2023-10-05 DIAGNOSIS — E860 Dehydration: Secondary | ICD-10-CM | POA: Diagnosis not present

## 2023-10-05 DIAGNOSIS — R7401 Elevation of levels of liver transaminase levels: Secondary | ICD-10-CM | POA: Diagnosis not present

## 2023-10-05 DIAGNOSIS — E11649 Type 2 diabetes mellitus with hypoglycemia without coma: Secondary | ICD-10-CM | POA: Diagnosis not present

## 2023-10-05 DIAGNOSIS — F1010 Alcohol abuse, uncomplicated: Secondary | ICD-10-CM | POA: Insufficient documentation

## 2023-10-05 DIAGNOSIS — J449 Chronic obstructive pulmonary disease, unspecified: Secondary | ICD-10-CM | POA: Diagnosis not present

## 2023-10-05 LAB — COMPREHENSIVE METABOLIC PANEL WITH GFR
ALT: 52 U/L — ABNORMAL HIGH (ref 0–44)
AST: 352 U/L — ABNORMAL HIGH (ref 15–41)
Albumin: 2.9 g/dL — ABNORMAL LOW (ref 3.5–5.0)
Alkaline Phosphatase: 75 U/L (ref 38–126)
Anion gap: 16 — ABNORMAL HIGH (ref 5–15)
BUN: 9 mg/dL (ref 6–20)
CO2: 18 mmol/L — ABNORMAL LOW (ref 22–32)
Calcium: 7.7 mg/dL — ABNORMAL LOW (ref 8.9–10.3)
Chloride: 103 mmol/L (ref 98–111)
Creatinine, Ser: 0.54 mg/dL (ref 0.44–1.00)
GFR, Estimated: >60 mL/min (ref >60)
Glucose, Bld: 202 mg/dL — ABNORMAL HIGH (ref 70–99)
Potassium: 4.2 mmol/L (ref 3.5–5.1)
Sodium: 137 mmol/L (ref 135–145)
Total Bilirubin: 1.3 mg/dL — ABNORMAL HIGH (ref 0.0–1.2)
Total Protein: 5.6 g/dL — ABNORMAL LOW (ref 6.5–8.1)

## 2023-10-05 LAB — CBC
HCT: 33 % — ABNORMAL LOW (ref 36.0–46.0)
Hemoglobin: 11.4 g/dL — ABNORMAL LOW (ref 12.0–15.0)
MCH: 34 pg (ref 26.0–34.0)
MCHC: 34.5 g/dL (ref 30.0–36.0)
MCV: 98.5 fL (ref 80.0–100.0)
Platelets: 189 K/uL (ref 150–400)
RBC: 3.35 MIL/uL — ABNORMAL LOW (ref 3.87–5.11)
RDW: 18.4 % — ABNORMAL HIGH (ref 11.5–15.5)
WBC: 2.5 K/uL — ABNORMAL LOW (ref 4.0–10.5)
nRBC: 0 % (ref 0.0–0.2)

## 2023-10-05 LAB — AMMONIA: Ammonia: 32 umol/L (ref 9–35)

## 2023-10-05 LAB — URINALYSIS, ROUTINE W REFLEX MICROSCOPIC
Bilirubin Urine: NEGATIVE
Glucose, UA: >=500 mg/dL — AB
Hgb urine dipstick: NEGATIVE
Ketones, ur: 20 mg/dL — AB
Leukocytes,Ua: NEGATIVE
Nitrite: NEGATIVE
Protein, ur: NEGATIVE mg/dL
Specific Gravity, Urine: 1.014 (ref 1.005–1.030)
pH: 5 (ref 5.0–8.0)

## 2023-10-05 LAB — CBG MONITORING, ED: Glucose-Capillary: 269 mg/dL — ABNORMAL HIGH (ref 70–99)

## 2023-10-05 LAB — RAPID URINE DRUG SCREEN, HOSP PERFORMED
Amphetamines: NOT DETECTED
Barbiturates: NOT DETECTED
Benzodiazepines: NOT DETECTED
Cocaine: NOT DETECTED
Opiates: NOT DETECTED
Tetrahydrocannabinol: NOT DETECTED

## 2023-10-05 LAB — HCG, SERUM, QUALITATIVE: Preg, Serum: NEGATIVE

## 2023-10-05 LAB — PROTIME-INR
INR: 1.1 (ref 0.8–1.2)
Prothrombin Time: 14.3 s (ref 11.4–15.2)

## 2023-10-05 LAB — TROPONIN I (HIGH SENSITIVITY)
Troponin I (High Sensitivity): 3 ng/L (ref <18)
Troponin I (High Sensitivity): 3 ng/L (ref <18)

## 2023-10-05 LAB — D-DIMER, QUANTITATIVE: D-Dimer, Quant: 0.92 ug{FEU}/mL — ABNORMAL HIGH (ref 0.00–0.50)

## 2023-10-05 LAB — ETHANOL: Alcohol, Ethyl (B): 143 mg/dL — ABNORMAL HIGH (ref <15)

## 2023-10-05 MED ORDER — SODIUM CHLORIDE 0.9 % IV BOLUS
1000.0000 mL | Freq: Once | INTRAVENOUS | Status: AC
  Administered 2023-10-05: 1000 mL via INTRAVENOUS

## 2023-10-05 MED ORDER — IOHEXOL 350 MG/ML SOLN
75.0000 mL | Freq: Once | INTRAVENOUS | Status: AC | PRN
  Administered 2023-10-05: 75 mL via INTRAVENOUS

## 2023-10-05 NOTE — ED Notes (Signed)
 Pt up to bedside commode xstandby assist. Pt tolerated well. States mild dizziness.

## 2023-10-05 NOTE — ED Notes (Addendum)
 Pt on bair hugger, MD notified of rectal temp 93.9

## 2023-10-05 NOTE — Discharge Instructions (Signed)
 Follow-up with your family doctor next week.  Make sure you eat 3 meals a day and decrease your alcohol  consumption.

## 2023-10-05 NOTE — ED Provider Notes (Signed)
 [employer] EMERGENCY DEPARTMENT AT [facility] Provider Note   CSN: [id number] Arrival date & time: 10/05/23  1330     Patient presents with: Chest Pain and Hypoglycemia   Hannah Rhodes is a 43 y.o. female.   Pt is a 43 yo female with pmhx significant for anxiety, htn, hepatitis, c, etoh abuse, copd, gerd and migraines.  Pt said she has not eaten in 2 days and has had some CP and headache and felt lightheaded.  She called EMS and CBG was 30.  Pt was given D50.  EMS did give pt asa and nitroglycerin which dropped her bp.  They gave her 400 cc ns en route and bp has improved.  Pt said she will often have episodes of not having much of an appetite.          Prior to Admission medications   Medication Sig Start Date End Date Taking? Authorizing Provider  albuterol  (VENTOLIN  HFA) 108 (90 Base) MCG/ACT inhaler Inhale 1-2 puffs into the lungs every 6 (six) hours as needed for wheezing or shortness of breath. 09/12/20   Okey Vina GAILS, MD  citalopram  (CELEXA ) 20 MG tablet Take 1 tablet (20 mg total) by mouth daily. 03/27/22   Carrion-Carrero, Marlo, MD  gabapentin  (NEURONTIN ) 100 MG capsule Take 1 capsule (100 mg total) by mouth 3 (three) times daily. 03/27/22   Carrion-Carrero, Marlo, MD  hydrOXYzine  (ATARAX ) 25 MG tablet Take by mouth. 03/16/22   [provider]  nicotine  (NICODERM CQ  - DOSED IN MG/24 HOURS) 14 mg/24hr patch Place 1 patch (14 mg total) onto the skin daily. 03/27/22   Carrion-Carrero, Marlo, MD  ondansetron  (ZOFRAN -ODT) 4 MG disintegrating tablet Take 1 tablet (4 mg total) by mouth every 8 (eight) hours as needed for nausea. 04/14/22   Cleotilde Rogue, MD  pantoprazole  (PROTONIX ) 40 MG tablet Take 1 tablet (40 mg total) by mouth daily. 04/14/22 06/13/22  Cleotilde Rogue, MD  traZODone  (DESYREL ) 50 MG tablet Take 1 tablet (50 mg total) by mouth at bedtime as needed for sleep. 03/27/22   Carrion-Carrero, Marlo, MD    Allergies: Wellbutrin  [bupropion ] and Adhesive  [tape]    Review of Systems  Constitutional:  Positive for appetite change.  Neurological:  Positive for weakness.  All other systems reviewed and are negative.   Updated Vital Signs BP (!) 107/91   Pulse 72   Temp (!) 94.5 F (34.7 C) (Oral)   Resp 17   SpO2 95%   Physical Exam Vitals and nursing note reviewed.  Constitutional:      Appearance: She is well-developed.  HENT:     Head: Normocephalic and atraumatic.     Mouth/Throat:     Mouth: Mucous membranes are dry.  Eyes:     Extraocular Movements: Extraocular movements intact.     Pupils: Pupils are equal, round, and reactive to light.  Cardiovascular:     Rate and Rhythm: Regular rhythm. Bradycardia present.     Heart sounds: Normal heart sounds.  Pulmonary:     Effort: Pulmonary effort is normal.     Breath sounds: Normal breath sounds.  Abdominal:     General: Bowel sounds are normal.     Palpations: Abdomen is soft.  Musculoskeletal:        General: Normal range of motion.     Cervical back: Normal range of motion and neck supple.  Skin:    General: Skin is warm.     Capillary Refill: Capillary refill takes less than  2 seconds.  Neurological:     General: No focal deficit present.     Mental Status: She is alert and oriented to person, place, and time.  Psychiatric:        Mood and Affect: Mood normal.        Behavior: Behavior normal.     (all labs ordered are listed, but only abnormal results are displayed) Labs Reviewed  CBC - Abnormal; Notable for the following components:      Result Value   WBC 2.5 (*)    RBC 3.35 (*)    Hemoglobin 11.4 (*)    HCT 33.0 (*)    RDW 18.4 (*)    All other components within normal limits  COMPREHENSIVE METABOLIC PANEL WITH GFR - Abnormal; Notable for the following components:   CO2 18 (*)    Glucose, Bld 202 (*)    Calcium  7.7 (*)    Total Protein 5.6 (*)    Albumin 2.9 (*)    AST 352 (*)    ALT 52 (*)    Total Bilirubin 1.3 (*)    Anion gap 16 (*)     All other components within normal limits  D-DIMER, QUANTITATIVE - Abnormal; Notable for the following components:   D-Dimer, Quant 0.92 (*)    All other components within normal limits  ETHANOL - Abnormal; Notable for the following components:   Alcohol , Ethyl (B) 143 (*)    All other components within normal limits  CBG MONITORING, ED - Abnormal; Notable for the following components:   Glucose-Capillary 269 (*)    All other components within normal limits  HCG, SERUM, QUALITATIVE  URINALYSIS, ROUTINE W REFLEX MICROSCOPIC  RAPID URINE DRUG SCREEN, HOSP PERFORMED  AMMONIA  PROTIME-INR  TROPONIN I (HIGH SENSITIVITY)  TROPONIN I (HIGH SENSITIVITY)    EKG: EKG Interpretation Date/Time:  Saturday October 05 2023 13:39:21 EDT Ventricular Rate:  63 PR Interval:  139 QRS Duration:  85 QT Interval:  469 QTC Calculation: 481 R Axis:   30  Text Interpretation: Sinus rhythm Consider left atrial enlargement Low voltage, extremity and precordial leads No significant change since last tracing Confirmed by Dean Clarity [phone]) on 10/05/2023 2:18:20 PM  Radiology: [facility] Chest Port 1 View Result Date: 10/05/2023 CLINICAL DATA:  Chest pain EXAM: PORTABLE CHEST 1 VIEW COMPARISON:  X-ray 04/22/2023 FINDINGS: No consolidation, pneumothorax or effusion. Normal cardiopericardial silhouette. Calcified aorta. Overlapping cardiac leads. IMPRESSION: No acute cardiopulmonary disease. Electronically Signed   By: Ranell Bring M.D.   On: 10/05/2023 14:26     Procedures   Medications Ordered in the ED  sodium chloride  0.9 % bolus 1,000 mL (has no administration in time range)  sodium chloride  0.9 % bolus 1,000 mL (0 mLs Intravenous Stopped 10/05/23 1451)                                    Medical Decision Making Amount and/or Complexity of Data Reviewed Labs: ordered. Radiology: ordered.   This patient presents to the ED for concern of cp, this involves an extensive number of treatment options, and  is a complaint that carries with it a high risk of complications and morbidity.  The differential diagnosis includes cardiac, pulm, gi, polysubstance abuse, dehydration, electrolyte abn   Co morbidities that complicate the patient evaluation  anxiety, htn, hepatitis, c, etoh abuse, copd, gerd and migraines   Additional history obtained:  Additional history  obtained from epic chart review External records from outside source obtained and reviewed including EMS report   Lab Tests:  I Ordered, and personally interpreted labs.  The pertinent results include:  cbc with wbc low at 2.5, hgb low at 11.4 (wbc 8.9 last year and hgb 12.4 last year); etoh elevated at 143; ddimer elevated at 0.92; preg neg; cmp with lfts elevated; glucose elevated (after D50)   Imaging Studies ordered:  I ordered imaging studies including cxr and head ct  I independently visualized and interpreted imaging which showed  CXR: No acute cardiopulmonary disease.  I agree with the radiologist interpretation   Cardiac Monitoring:  The patient was maintained on a cardiac monitor.  I personally viewed and interpreted the cardiac monitored which showed an underlying rhythm of: sb   Medicines ordered and prescription drug management:  I ordered medication including ivfs  for sx  Reevaluation of the patient after these medicines showed that the patient improved I have reviewed the patients home medicines and have made adjustments as needed   Test Considered:  ct   Critical Interventions:  ivfs   Problem List / ED Course:  Hypoglycemia:  likely due to poor intake Elevated LFTs:  likely due to alcohol  abuse.  Hx of hep c, but lfts ok last year   Reevaluation:  After the interventions noted above, I reevaluated the patient and found that they have :improved   Social Determinants of Health:  Lives at home   Dispostion:  Pending at shift change     Final diagnoses:  ETOH abuse   Hypoglycemia  Transaminitis  Dehydration    ED Discharge Orders     None          Dean Clarity, MD 10/05/23 1539

## 2023-10-05 NOTE — ED Notes (Signed)
 Patient transported to CT

## 2023-10-05 NOTE — ED Triage Notes (Signed)
 Pt arrives via [facility] from home c/o CP. Per pt she began having CP approximately one hour ago. She has also felt increasingly weak today, not eating much over the last two days. On EMS arrival CBG was in the 30's, and was given D50. Pt also given aspirin and nitroglycerin for her chest pain. Pt's BP then dropped to 80/50. Given 400 mL NS bolus enroute with mild improvement.

## 2023-10-05 NOTE — ED Notes (Signed)
 Bair hugger removed at this time

## 2023-10-05 NOTE — ED Notes (Signed)
 Pt offered sandwich and drink, pt states she is going to eat with mother. Pt understanding of DC instructions. Pt in wheelchair to front lobby to wait for ride.

## 2023-12-04 ENCOUNTER — Other Ambulatory Visit: Payer: Self-pay

## 2023-12-04 ENCOUNTER — Emergency Department (HOSPITAL_COMMUNITY)
Admission: EM | Admit: 2023-12-04 | Discharge: 2023-12-04 | Discharge status: Against Medical Advice | Payer: MEDICAID | Attending: Emergency Medicine | Admitting: Emergency Medicine

## 2023-12-04 ENCOUNTER — Encounter (HOSPITAL_COMMUNITY): Payer: Self-pay

## 2023-12-04 DIAGNOSIS — F1020 Alcohol dependence, uncomplicated: Secondary | ICD-10-CM | POA: Insufficient documentation

## 2023-12-04 DIAGNOSIS — Z5321 Procedure and treatment not carried out due to patient leaving prior to being seen by health care provider: Secondary | ICD-10-CM | POA: Insufficient documentation

## 2023-12-04 DIAGNOSIS — Y908 Blood alcohol level of 240 mg/100 ml or more: Secondary | ICD-10-CM | POA: Insufficient documentation

## 2023-12-04 DIAGNOSIS — R002 Palpitations: Secondary | ICD-10-CM | POA: Diagnosis present

## 2023-12-04 LAB — COMPREHENSIVE METABOLIC PANEL WITH GFR
ALT: 44 U/L (ref 0–44)
AST: 104 U/L — ABNORMAL HIGH (ref 15–41)
Albumin: 3.3 g/dL — ABNORMAL LOW (ref 3.5–5.0)
Alkaline Phosphatase: 58 U/L (ref 38–126)
Anion gap: 13 (ref 5–15)
BUN: <5 mg/dL — ABNORMAL LOW (ref 6–20)
CO2: 24 mmol/L (ref 22–32)
Calcium: 8.5 mg/dL — ABNORMAL LOW (ref 8.9–10.3)
Chloride: 103 mmol/L (ref 98–111)
Creatinine, Ser: 0.53 mg/dL (ref 0.44–1.00)
GFR, Estimated: >60 mL/min (ref >60)
Glucose, Bld: 99 mg/dL (ref 70–99)
Potassium: 3.7 mmol/L (ref 3.5–5.1)
Sodium: 140 mmol/L (ref 135–145)
Total Bilirubin: 0.5 mg/dL (ref 0.0–1.2)
Total Protein: 6.1 g/dL — ABNORMAL LOW (ref 6.5–8.1)

## 2023-12-04 LAB — CBC WITH DIFFERENTIAL/PLATELET
Abs Immature Granulocytes: 0 K/uL (ref 0.00–0.07)
Basophils Absolute: 0 K/uL (ref 0.0–0.1)
Basophils Relative: 1 %
Eosinophils Absolute: 0 K/uL (ref 0.0–0.5)
Eosinophils Relative: 1 %
HCT: 33 % — ABNORMAL LOW (ref 36.0–46.0)
Hemoglobin: 11.5 g/dL — ABNORMAL LOW (ref 12.0–15.0)
Immature Granulocytes: 0 %
Lymphocytes Relative: 71 %
Lymphs Abs: 3 K/uL (ref 0.7–4.0)
MCH: 34 pg (ref 26.0–34.0)
MCHC: 34.8 g/dL (ref 30.0–36.0)
MCV: 97.6 fL (ref 80.0–100.0)
Monocytes Absolute: 0.4 K/uL (ref 0.1–1.0)
Monocytes Relative: 10 %
Neutro Abs: 0.7 K/uL — ABNORMAL LOW (ref 1.7–7.7)
Neutrophils Relative %: 17 %
Platelets: 146 K/uL — ABNORMAL LOW (ref 150–400)
RBC: 3.38 MIL/uL — ABNORMAL LOW (ref 3.87–5.11)
RDW: 15.9 % — ABNORMAL HIGH (ref 11.5–15.5)
Smear Review: NORMAL
WBC: 4.2 K/uL (ref 4.0–10.5)
nRBC: 0 % (ref 0.0–0.2)

## 2023-12-04 LAB — URINALYSIS, ROUTINE W REFLEX MICROSCOPIC
Bilirubin Urine: NEGATIVE
Glucose, UA: NEGATIVE mg/dL
Hgb urine dipstick: NEGATIVE
Ketones, ur: NEGATIVE mg/dL
Leukocytes,Ua: NEGATIVE
Nitrite: NEGATIVE
Protein, ur: NEGATIVE mg/dL
Specific Gravity, Urine: 1.004 — ABNORMAL LOW (ref 1.005–1.030)
pH: 6 (ref 5.0–8.0)

## 2023-12-04 LAB — RAPID URINE DRUG SCREEN, HOSP PERFORMED
Amphetamines: NOT DETECTED
Barbiturates: NOT DETECTED
Benzodiazepines: NOT DETECTED
Cocaine: NOT DETECTED
Opiates: NOT DETECTED
Tetrahydrocannabinol: NOT DETECTED

## 2023-12-04 LAB — CBG MONITORING, ED: Glucose-Capillary: 104 mg/dL — ABNORMAL HIGH (ref 70–99)

## 2023-12-04 LAB — HCG, SERUM, QUALITATIVE: Preg, Serum: NEGATIVE

## 2023-12-04 LAB — ETHANOL: Alcohol, Ethyl (B): 341 mg/dL (ref <15)

## 2023-12-04 LAB — MAGNESIUM: Magnesium: 1.9 mg/dL (ref 1.7–2.4)

## 2023-12-04 LAB — TROPONIN I (HIGH SENSITIVITY): Troponin I (High Sensitivity): 3 ng/L (ref <18)

## 2023-12-04 LAB — LIPASE, BLOOD: Lipase: 50 U/L (ref 11–51)

## 2023-12-04 NOTE — ED Notes (Signed)
 Patient refusing to stay and demanding IV be taken out.  IV taken out patient left ER with steady gait and reported a family member is on their way to pick her up from [place].

## 2023-12-04 NOTE — ED Triage Notes (Signed)
 BIB [facility] for palpitations.  Patient reports she is an alcoholic last drink was this am.  She drinks several 40z beers and a few shots a day but report having palpitations this morning and ems reports she got tachy in the 130-140s then was hanging around the 1 teens enroute.  CBG was initially 67 so she drank OJ and up to 169.  Patient reports n/v  for the past few days after pulling some ticks off of her and its only when she eats meat.  Denies alcohol  withdraw seizures.

## 2024-01-21 ENCOUNTER — Other Ambulatory Visit: Payer: Self-pay

## 2024-01-21 ENCOUNTER — Emergency Department (HOSPITAL_COMMUNITY)
Admission: EM | Admit: 2024-01-21 | Discharge: 2024-01-22 | Disposition: A | Payer: MEDICAID | Attending: Emergency Medicine | Admitting: Emergency Medicine

## 2024-01-21 ENCOUNTER — Encounter (HOSPITAL_COMMUNITY): Payer: Self-pay | Admitting: Emergency Medicine

## 2024-01-21 DIAGNOSIS — F17210 Nicotine dependence, cigarettes, uncomplicated: Secondary | ICD-10-CM | POA: Insufficient documentation

## 2024-01-21 DIAGNOSIS — R21 Rash and other nonspecific skin eruption: Secondary | ICD-10-CM | POA: Insufficient documentation

## 2024-01-21 DIAGNOSIS — J449 Chronic obstructive pulmonary disease, unspecified: Secondary | ICD-10-CM | POA: Insufficient documentation

## 2024-01-21 DIAGNOSIS — I10 Essential (primary) hypertension: Secondary | ICD-10-CM | POA: Insufficient documentation

## 2024-01-21 NOTE — ED Triage Notes (Addendum)
 Pt BIB [facility] c/o hives from her feet to abd, that started tonight at about 1930. States rash started after eating beef.   +ETOH

## 2024-01-22 NOTE — ED Provider Notes (Signed)
 [facility]-EMERGENCY DEPT [facility] Emergency Department Provider Note MRN:  [id number]  Arrival date & time: 01/22/24     Chief Complaint   Rash   History of Present Illness   Hannah Rhodes is a 43 y.o. year-old female with a history of alcohol  use disorder, hepatitis C presenting to the ED with chief complaint of rash.  Patient started having itchy red rash after eating beef tacos tonight.  Had a rash 1 other time after eating a hamburger.  Took Benadryl  and the rash is now gone.  Feels fine now.  Review of Systems  A thorough review of systems was obtained and all systems are negative except as noted in the HPI and PMH.   Patient's Health History    Past Medical History:  Diagnosis Date   Alcoholism (HCC)    Anxiety    Chronic hepatitis C (HCC)    Coma (HCC)    while pregnant   COPD (chronic obstructive pulmonary disease) (HCC)    no inaler   DDD (degenerative disc disease), lumbar    Fatty liver    Generalized headaches    GERD (gastroesophageal reflux disease)    diet controlled, no med   Hepatitis C    Heroin use    HPV (human papilloma virus) infection    Hypertension    Insomnia    Migraine    Panic attack    Pneumonia 03/2010   Smoker    Tachycardia     Past Surgical History:  Procedure Laterality Date   BIOPSY  09/28/2019   Procedure: BIOPSY;  Surgeon: Wilhelmenia Aloha Raddle., MD;  Location: [facility] ENDOSCOPY;  Service: Gastroenterology;;   CESAREAN SECTION     CESAREAN SECTION     ESOPHAGOGASTRODUODENOSCOPY (EGD) WITH PROPOFOL  N/A 09/28/2019   Procedure: ESOPHAGOGASTRODUODENOSCOPY (EGD) WITH PROPOFOL ;  Surgeon: Wilhelmenia Aloha Raddle., MD;  Location: [facility] ENDOSCOPY;  Service: Gastroenterology;  Laterality: N/A;   EUS  09/28/2019   Procedure: UPPER ENDOSCOPIC ULTRASOUND (EUS) LINEAR;  Surgeon: Wilhelmenia Aloha Raddle., MD;  Location: [facility] ENDOSCOPY;  Service: Gastroenterology;;   EUS  09/28/2019   Procedure: FULL UPPER ENDOSCOPIC ULTRASOUND (EUS) RADIAL;  Surgeon:  Wilhelmenia Aloha Raddle., MD;  Location: [facility] ENDOSCOPY;  Service: Gastroenterology;;   FINE NEEDLE ASPIRATION  09/28/2019   Procedure: FINE NEEDLE ASPIRATION (FNA) LINEAR;  Surgeon: Wilhelmenia Aloha Raddle., MD;  Location: [facility] ENDOSCOPY;  Service: Gastroenterology;;   SMALL INTESTINE SURGERY     WISDOM TOOTH EXTRACTION      Family History  Problem Relation Age of Onset   Heart failure Mother    CAD Mother    Heart attack Mother    Anxiety disorder Sister    Restless legs syndrome Sister    Dementia Maternal Grandmother    Dementia Maternal Grandfather    Colon cancer Neg Hx    Esophageal cancer Neg Hx    Inflammatory bowel disease Neg Hx    Liver disease Neg Hx    Pancreatic cancer Neg Hx    Stomach cancer Neg Hx    Rectal cancer Neg Hx     Social History   Socioeconomic History   Marital status: Single    Spouse name: Not on file   Number of children: 7   Years of education: Not on file   Highest education level: Not on file  Occupational History   Occupation: Homemaker  Tobacco Use   Smoking status: Every Day    Current packs/day: 0.50    Average packs/day: 0.5 packs/day for 24.0  years (12.0 ttl pk-yrs)    Types: Cigarettes   Smokeless tobacco: Never   Tobacco comments:    Cut back to 5 cig/day  Vaping Use   Vaping status: Never Used  Substance and Sexual Activity   Alcohol  use: Yes    Comment: daily , beer, liquor and wine   Drug use: Not Currently    Types: Marijuana, Heroin, Oxycodone , Hydrocodone , Cocaine    Comment: nothing at this time (last month was last use 07/01/21)   Sexual activity: Not Currently  Other Topics Concern   Not on file  Social History Narrative   Not on file   Social Drivers of Health   Financial Resource Strain: Low Risk  (06/20/2023)   Received from [facility]   Overall Financial Resource Strain (CARDIA)    Difficulty of Paying Living Expenses: Not hard at all  Food Insecurity: No Food Insecurity (10/10/2023)   Received from [facility]   Hunger Vital Sign    Within the past 12 months, you worried that your food would run out before you got the money to buy more.: Never true    Within the past 12 months, the food you bought just didn't last and you didn't have money to get more.: Never true  Transportation Needs: No Transportation Needs (10/10/2023)   Received from [facility]   PRAPARE - Transportation    Lack of Transportation (Medical): No    Lack of Transportation (Non-Medical): No  Physical Activity: Inactive (10/10/2023)   Received from [facility]   Exercise Vital Sign    On average, how many days per week do you engage in moderate to strenuous exercise (like a brisk walk)?: 0 days    On average, how many minutes do you engage in exercise at this level?: 0 min  Stress: No Stress Concern Present (10/10/2023)   Received from [facility] - Occupational Stress Questionnaire    Do you feel stress - tense, restless, nervous, or anxious, or unable to sleep at night because your mind is troubled all the time - these days?: Only a little  Social Connections: Socially Isolated (06/20/2023)   Received from [facility]   Social Connection and Isolation Panel    In a typical week, how many times do you talk on the phone with family, friends, or neighbors?: More than three times a week    How often do you get together with friends or relatives?: More than three times a week    How often do you attend church or religious services?: Never    Do you belong to any clubs or organizations such as church groups, unions, fraternal or athletic groups, or school groups?: No    How often do you attend meetings of the clubs or organizations you belong to?: Never    Are you married, widowed, divorced, separated, never married, or living with a partner?: Never married  Intimate Partner Violence: Not At Risk (10/10/2023)   Received from [facility]   Humiliation, Afraid,  Rape, and Kick questionnaire    Within the last year, have you been afraid of your partner or ex-partner?: No    Within the last year, have you been humiliated or emotionally abused in other ways by your partner or ex-partner?: No    Within the last year, have you been kicked, hit, slapped, or otherwise physically hurt by your partner or ex-partner?: No  Within the last year, have you been raped or forced to have any kind of sexual activity by your partner or ex-partner?: No     Physical Exam   Vitals:   01/21/24 2104 01/22/24 0138  BP: 101/76 104/75  Pulse: 74 65  Resp: 15 16  Temp: 98.2 F (36.8 C) 97.8 F (36.6 C)  SpO2: 100% 99%    CONSTITUTIONAL: Well-appearing, NAD NEURO/PSYCH:  Alert and oriented x 3, no focal deficits EYES:  eyes equal and reactive ENT/NECK:  no LAD, no JVD CARDIO: Regular rate, well-perfused, normal S1 and S2 PULM:  CTAB no wheezing or rhonchi GI/GU:  non-distended, non-tender MSK/SPINE:  No gross deformities, no edema SKIN:  no rash, atraumatic   *Additional and/or pertinent findings included in MDM below  Diagnostic and Interventional Summary    EKG Interpretation Date/Time:    Ventricular Rate:    PR Interval:    QRS Duration:    QT Interval:    QTC Calculation:   R Axis:      Text Interpretation:         Labs Reviewed - No data to display  No orders to display    Medications - No data to display   Procedures  /  Critical Care Procedures  ED Course and Medical Decision Making  Initial Impression and Ddx Rash that is now resolved.  Seems to have consistent rash after beef products which would suggest alpha gal.  Patient is aware of this possibility and complaints follow-up with primary care doctor.  Past medical/surgical history that increases complexity of ED encounter: Alcohol  use disorder  Interpretation of Diagnostics Laboratory and/or imaging options to aid in the diagnosis/care of the patient were considered.  After  careful history and physical examination, it was determined that there was no indication for diagnostics at this time.  Patient Reassessment and Ultimate Disposition/Management     Discharge  Patient management required discussion with the following services or consulting groups:  None  Complexity of Problems Addressed Acute complicated illness or Injury  Additional Data Reviewed and Analyzed Further history obtained from: None  Additional Factors Impacting ED Encounter Risk None  Ozell HERO. Theadore, MD [facility] [email or web address]  Final Clinical Impressions(s) / ED Diagnoses     ICD-10-CM   1. Rash  R21       ED Discharge Orders     None        Discharge Instructions Discussed with and Provided to Patient:    Discharge Instructions      You were evaluated in the Emergency Department and after careful evaluation, we did not find any emergent condition requiring admission or further testing in the hospital.  Your exam/testing today is overall reassuring.  Symptoms may be related to alpha gal as we discussed.  Continue avoiding beef products, follow-up with your primary care doctor.  Please return to the Emergency Department if you experience any worsening of your condition.   Thank you for allowing us  to be a part of your care.      Theadore Ozell HERO, MD 01/22/24 [phone]

## 2024-01-22 NOTE — Discharge Instructions (Signed)
 You were evaluated in the Emergency Department and after careful evaluation, we did not find any emergent condition requiring admission or further testing in the hospital.  Your exam/testing today is overall reassuring.  Symptoms may be related to alpha gal as we discussed.  Continue avoiding beef products, follow-up with your primary care doctor.  Please return to the Emergency Department if you experience any worsening of your condition.   Thank you for allowing us  to be a part of your care.
